# Patient Record
Sex: Female | Born: 1970 | Hispanic: Yes | Marital: Married | Smoking: Never smoker
Health system: Southern US, Community
[De-identification: ages and names within clinical notes are randomized; demographics above are authoritative.]

## PROBLEM LIST (undated history)

## (undated) DIAGNOSIS — F329 Major depressive disorder, single episode, unspecified: Secondary | ICD-10-CM

## (undated) DIAGNOSIS — F32A Depression, unspecified: Secondary | ICD-10-CM

## (undated) DIAGNOSIS — M797 Fibromyalgia: Secondary | ICD-10-CM

## (undated) DIAGNOSIS — F419 Anxiety disorder, unspecified: Secondary | ICD-10-CM

## (undated) DIAGNOSIS — K589 Irritable bowel syndrome without diarrhea: Secondary | ICD-10-CM

## (undated) DIAGNOSIS — H832X9 Labyrinthine dysfunction, unspecified ear: Secondary | ICD-10-CM

## (undated) DIAGNOSIS — E8940 Asymptomatic postprocedural ovarian failure: Secondary | ICD-10-CM

## (undated) DIAGNOSIS — J309 Allergic rhinitis, unspecified: Secondary | ICD-10-CM

## (undated) DIAGNOSIS — T7840XA Allergy, unspecified, initial encounter: Secondary | ICD-10-CM

## (undated) DIAGNOSIS — H8120 Vestibular neuronitis, unspecified ear: Secondary | ICD-10-CM

## (undated) DIAGNOSIS — L0591 Pilonidal cyst without abscess: Secondary | ICD-10-CM

## (undated) DIAGNOSIS — M1990 Unspecified osteoarthritis, unspecified site: Secondary | ICD-10-CM

## (undated) DIAGNOSIS — G4700 Insomnia, unspecified: Secondary | ICD-10-CM

## (undated) DIAGNOSIS — F3289 Other specified depressive episodes: Secondary | ICD-10-CM

## (undated) DIAGNOSIS — E8941 Symptomatic postprocedural ovarian failure: Secondary | ICD-10-CM

## (undated) DIAGNOSIS — G43909 Migraine, unspecified, not intractable, without status migrainosus: Secondary | ICD-10-CM

## (undated) DIAGNOSIS — H8190 Unspecified disorder of vestibular function, unspecified ear: Secondary | ICD-10-CM

## (undated) DIAGNOSIS — K219 Gastro-esophageal reflux disease without esophagitis: Secondary | ICD-10-CM

## (undated) HISTORY — DX: Vestibular neuronitis, unspecified ear: H81.20

## (undated) HISTORY — DX: Asymptomatic postprocedural ovarian failure: E89.40

## (undated) HISTORY — DX: Allergy, unspecified, initial encounter: T78.40XA

## (undated) HISTORY — DX: Insomnia, unspecified: G47.00

## (undated) HISTORY — DX: Fibromyalgia: M79.7

## (undated) HISTORY — PX: BREAST BIOPSY: SHX20

## (undated) HISTORY — PX: OTHER SURGICAL HISTORY: SHX169

## (undated) HISTORY — DX: Major depressive disorder, single episode, unspecified: F32.9

## (undated) HISTORY — DX: Allergic rhinitis, unspecified: J30.9

## (undated) HISTORY — PX: CHOLECYSTECTOMY: SHX55

## (undated) HISTORY — PX: APPENDECTOMY: SHX54

## (undated) HISTORY — DX: Unspecified disorder of vestibular function, unspecified ear: H81.90

## (undated) HISTORY — DX: Irritable bowel syndrome, unspecified: K58.9

## (undated) HISTORY — DX: Depression, unspecified: F32.A

## (undated) HISTORY — DX: Unspecified osteoarthritis, unspecified site: M19.90

## (undated) HISTORY — DX: Pilonidal cyst without abscess: L05.91

## (undated) HISTORY — DX: Anxiety disorder, unspecified: F41.9

## (undated) HISTORY — DX: Symptomatic postprocedural ovarian failure: E89.41

## (undated) HISTORY — PX: TOTAL ABDOMINAL HYSTERECTOMY: SHX209

## (undated) HISTORY — PX: KNEE SURGERY: SHX244

## (undated) HISTORY — DX: Gastro-esophageal reflux disease without esophagitis: K21.9

## (undated) HISTORY — DX: Other specified depressive episodes: F32.89

## (undated) HISTORY — PX: TEMPOROMANDIBULAR JOINT SURGERY: SHX35

## (undated) HISTORY — PX: BILATERAL SALPINGOOPHORECTOMY: SHX1223

## (undated) HISTORY — DX: Labyrinthine dysfunction, unspecified ear: H83.2X9

## (undated) HISTORY — DX: Migraine, unspecified, not intractable, without status migrainosus: G43.909

## (undated) HISTORY — PX: UPPER GASTROINTESTINAL ENDOSCOPY: SHX188

---

## 1998-05-23 ENCOUNTER — Ambulatory Visit (HOSPITAL_COMMUNITY): Admission: RE | Admit: 1998-05-23 | Discharge: 1998-05-23 | Payer: Self-pay | Admitting: Obstetrics and Gynecology

## 1999-03-16 ENCOUNTER — Other Ambulatory Visit: Admission: RE | Admit: 1999-03-16 | Discharge: 1999-03-16 | Payer: Self-pay | Admitting: Obstetrics and Gynecology

## 1999-10-27 ENCOUNTER — Other Ambulatory Visit: Admission: RE | Admit: 1999-10-27 | Discharge: 1999-10-27 | Payer: Self-pay | Admitting: Orthopedic Surgery

## 2000-12-16 ENCOUNTER — Ambulatory Visit (HOSPITAL_COMMUNITY): Admission: RE | Admit: 2000-12-16 | Discharge: 2000-12-16 | Payer: Self-pay | Admitting: Obstetrics and Gynecology

## 2006-04-04 ENCOUNTER — Ambulatory Visit: Payer: Self-pay | Admitting: Gynecology

## 2006-09-26 ENCOUNTER — Ambulatory Visit: Payer: Self-pay | Admitting: Gastroenterology

## 2006-10-14 ENCOUNTER — Ambulatory Visit: Payer: Self-pay | Admitting: Gastroenterology

## 2006-12-15 ENCOUNTER — Ambulatory Visit: Payer: Self-pay | Admitting: Gastroenterology

## 2007-06-30 ENCOUNTER — Encounter (INDEPENDENT_AMBULATORY_CARE_PROVIDER_SITE_OTHER): Payer: Self-pay | Admitting: *Deleted

## 2007-11-01 ENCOUNTER — Ambulatory Visit: Payer: Self-pay | Admitting: Gastroenterology

## 2007-11-18 ENCOUNTER — Emergency Department: Payer: Self-pay | Admitting: Emergency Medicine

## 2007-11-19 ENCOUNTER — Inpatient Hospital Stay: Payer: Self-pay | Admitting: Internal Medicine

## 2008-02-13 ENCOUNTER — Ambulatory Visit: Payer: Self-pay | Admitting: Family Medicine

## 2009-08-05 ENCOUNTER — Ambulatory Visit: Payer: Self-pay | Admitting: Family Medicine

## 2009-09-01 ENCOUNTER — Encounter: Payer: Self-pay | Admitting: Family Medicine

## 2009-09-05 ENCOUNTER — Encounter: Payer: Self-pay | Admitting: Family Medicine

## 2009-10-06 ENCOUNTER — Encounter: Payer: Self-pay | Admitting: Family Medicine

## 2009-10-07 ENCOUNTER — Ambulatory Visit: Payer: Self-pay | Admitting: Unknown Physician Specialty

## 2009-10-21 ENCOUNTER — Ambulatory Visit: Payer: Self-pay | Admitting: Family Medicine

## 2009-10-28 ENCOUNTER — Encounter: Payer: Self-pay | Admitting: Orthopedic Surgery

## 2009-11-05 ENCOUNTER — Encounter: Payer: Self-pay | Admitting: Family Medicine

## 2009-11-05 ENCOUNTER — Encounter: Payer: Self-pay | Admitting: Orthopedic Surgery

## 2010-09-24 ENCOUNTER — Encounter: Payer: Self-pay | Admitting: Unknown Physician Specialty

## 2010-10-06 ENCOUNTER — Encounter: Payer: Self-pay | Admitting: Unknown Physician Specialty

## 2010-11-05 ENCOUNTER — Encounter: Payer: Self-pay | Admitting: Unknown Physician Specialty

## 2010-12-06 ENCOUNTER — Encounter: Payer: Self-pay | Admitting: Unknown Physician Specialty

## 2010-12-27 ENCOUNTER — Encounter: Payer: Self-pay | Admitting: Gynecology

## 2011-04-23 NOTE — Op Note (Signed)
[facility]  Patient:    Angela Morgan, Angela Morgan                    MRN: [id number] Proc. Date: 12/16/00 Adm. Date:  [id number] Attending:  Frederich Balding                           Operative Report  PREOPERATIVE DIAGNOSIS:       Pelvic endometriosis.  POSTOPERATIVE DIAGNOSIS:      Pelvic endometriosis.  OPERATIVE PROCEDURE:          1. Diagnostic laparoscopy.                               2. Laser ablation of endometriotic implants.  SURGEON:                      Juluis Mire, M.D.  ANESTHESIA:                   General endotracheal.  ESTIMATED BLOOD LOSS:         Minimal.  PACKS/DRAINS:                 None.  INTRAOPERATIVE BLOOD PLACEMENT:  None.  COMPLICATIONS:                None.  INDICATIONS:                  As dictated in the history and physical.  DESCRIPTION OF PROCEDURE:     The patient was taken to the operating room and placed in the supine position.  After a satisfactory level of general endotracheal anesthesia was obtained, the patient was placed in the dorsal lithotomy position using the Allen stirrups.  The abdomen, perineum, and vagina were prepped out with Betadine, and draped as a sterile field.  The bladder was emptied with catheterization.  A Hulka tenaculum was then put in place.  A subumbilical incision was made with the knife.  The Veress needle was then introduced into the abdominal cavity.  The abdomen was insufflated with CO2 until the pressure reading read 20 mmHg.  The trocar was then introduced along with the laparoscope.  There was no evidence of injury to adjacent organs.  The pressure maximum was then decreased to 15 mmHg.  A 5 mm trocar was placed into the suprapubic area under direct visualization.  The appendix was unremarkable.  Both lateral gutters were cleared.  The upper abdomen, including the liver and the tip of the gallbladder were unremarkable. The uterus was upper limits of normal size.  She  had some whitish discolored adhesive lesions on the posterior aspect of the uterus.  She had two peritoneal defects in the cul-de-sac with associated superficial endometriosis.  She had an implant of whitish endometriosis along the left pelvic side wall and right pelvic side wall.  The tubes and ovaries were unremarkable and uninvolved.  There were no adhesions in the cul-de-sac. Using the Yag laser with the small rounded sapphire tip, the areas of endometriosis in the posterior aspect of the uterus, cul-de-sac, and both lateral walls were adequately ablated.  We thoroughly irrigated the area. There was no evidence of injury to adjacent organs, and no active bleeding. We did put in a third 5 mm trocar in the left lower quadrant, after visualizing the epigastric vessels, for  elevation of the sigmoid colon during the cul-de-sac ablation.  At the end of the procedure we had good hemostasis. The abdomen was deflated with carbon dioxide.  All trocars were removed.  The subumbilical incision was closed with interrupted subcuticulars of #4-0 Vicryl.  The suprapubic incision was closed with Steri-Strips.  The Hulka tenaculum was then removed.  The patient was taken out of the dorsal lithotomy position.  Once extubated and alert, she was transferred to the recovery room in good condition.  The sponge, instrument, and needle counts were reported as correct by the circulating nurse x 2. DD:  12/16/00 TD:  12/17/00 Job: [id number]

## 2011-04-23 NOTE — H&P (Signed)
[facility]  Patient:    Angela Morgan, Angela Morgan                    MRN: [id number] Adm. Date:  [id number] Attending:  Frederich Balding                         History and Physical  HISTORY:                      The patient is a 40 year old gravida 4, para 2-0-2-2, married white female who presents for a diagnostic laparoscopy with laser standby, for evaluation of pelvic pain.  The patient has a long history of endometriosis.  She had two prior laparoscopies, one in 1993, one in 1999, with finding of minimal pelvic endometriosis.  She presents is complaining of increasing pelvic pain and discomfort.  She has been on Lupron; however, despite the Lupron therapy she has continued to have significant pain and discomfort.  Due to this, she has undergone an ultrasound as well as antibiotic treatment without improvement. She also has been tried on birth control pills, without help.  She presents now at the present time to undergo a repeat laparoscopy with laser standby.  ALLERGIES:                    PREDNISONE, BIAXIN.  CURRENT MEDICATIONS:          Lupron.  PAST MEDICAL HISTORY:         Usual childhood diseases, without any significant sequela.  OBSTETRIC HISTORY:            She has had two prior cesarean sections, one spontaneous abortion in 1999.  A pregnancy termination in 1997, due to spina bifida.  PAST SURGICAL HISTORY:        Two previous diagnostic laparoscopies and a previous D&C.  She has also had previous knee surgery twice, one in 1989, and one in 1990.  FAMILY HISTORY:               Noncontributory.  SOCIAL HISTORY:               No tobacco or alcohol use.  REVIEW OF SYSTEMS:            Noncontributory.  PHYSICAL EXAMINATION:  VITAL SIGNS:                  The patient is afebrile with stable vital signs.  HEENT:                        Normocephalic.  Pupils equal, round, reactive to light and accommodation.  Extraocular movements  intact.  Sclerae and conjunctivae clear.  Oropharynx clear.  NECK:                         Without thyromegaly.  BREASTS:                      No discrete masses.  LUNGS:                        Clear.  CARDIOVASCULAR:               A regular rhythm and rate, without murmurs or gallops.  ABDOMEN:  Benign.  PELVIC:                       Normal external genitalia.  Vaginal mucosa clear.  Cervix is unremarkable.  Uterus midposition.  Marked cul-de-sac tenderness.  Adnexa unremarkable.  EXTREMITIES:                  Trace of edema.  NEUROLOGIC:                   Grossly normal.  IMPRESSION:                   Recurrent pelvic endometriosis and/or uterine                               adenomyosis.  PLAN:                         The patient will undergo a repeat diagnostic laparoscopy with laser standby.  The risks of surgery have been discussed, including the risks of anesthesia, the risks of infection, risks of hemorrhage that can necessitate a transfusion, with the risks of AIDS or hepatitis.  The risks of injury to adjacent organs, including the bladder and bowel that could require further exploratory surgery, the risks of deep vein thrombosis and pulmonary embolus.  The patient expressed an understanding of the indications and risks. DD:  12/16/00 TD:  12/16/00 Job: [id number]

## 2012-05-03 ENCOUNTER — Ambulatory Visit: Payer: Self-pay | Admitting: Family Medicine

## 2012-06-29 ENCOUNTER — Encounter: Payer: Self-pay | Admitting: Unknown Physician Specialty

## 2012-07-06 ENCOUNTER — Encounter: Payer: Self-pay | Admitting: Unknown Physician Specialty

## 2012-07-31 ENCOUNTER — Encounter: Payer: Self-pay | Admitting: Family Medicine

## 2012-07-31 ENCOUNTER — Ambulatory Visit (INDEPENDENT_AMBULATORY_CARE_PROVIDER_SITE_OTHER): Payer: BC Managed Care – PPO | Admitting: Family Medicine

## 2012-07-31 VITALS — BP 109/72 | HR 61 | Temp 98.2°F | Resp 16 | Ht 64.5 in | Wt 169.0 lb

## 2012-07-31 DIAGNOSIS — F3289 Other specified depressive episodes: Secondary | ICD-10-CM

## 2012-07-31 DIAGNOSIS — F329 Major depressive disorder, single episode, unspecified: Secondary | ICD-10-CM

## 2012-07-31 DIAGNOSIS — G4700 Insomnia, unspecified: Secondary | ICD-10-CM

## 2012-07-31 DIAGNOSIS — H8120 Vestibular neuronitis, unspecified ear: Secondary | ICD-10-CM

## 2012-07-31 DIAGNOSIS — H832X9 Labyrinthine dysfunction, unspecified ear: Secondary | ICD-10-CM

## 2012-07-31 DIAGNOSIS — F418 Other specified anxiety disorders: Secondary | ICD-10-CM

## 2012-07-31 MED ORDER — SERTRALINE HCL 100 MG PO TABS: ORAL_TABLET | ORAL | Status: DC

## 2012-07-31 NOTE — Patient Instructions (Addendum)
1. Depression  sertraline (ZOLOFT) 100 MG tablet, Ambulatory referral to Psychiatry  2. Vestibular neuritis    3. Vestibular dysfunction    4. Insomnia

## 2012-07-31 NOTE — Progress Notes (Signed)
Subjective:    Patient ID: Angela Morgan, female    DOB: December 27, 1970, 41 y.o.   MRN: [id number]  HPIThis 41 y.o. female presents for evaluation of   1.  Depression:  Loves seeing [name]; seeing her once weekly.  "I cannot do life" having good and bad days.  Baby steps at this time.  Taking day by day.  Last visit with her was a really bad day; good timing.  Staying really busy which has been good.  [place] recommended working again and cannot do it; financially need the money but can't do it.  +SI two weeks ago; felt overwhelmed.  Planned birthday party for son before school started.  Ambien for sleep; taking once every 2 weeks.  Clonazepam 1/2 during the day and 1/2 night around 7:30-8:00.  Sometimes anxiety med not enough; worse at night.  Zoloft 100mg  daily for one month.  Sleeping 7 hours of sleep on most night; usually gets 4-6 hours per night.   2.  Vestibular neuritis: follow up with [facility]; vestibular rehab not working.  Concerned that PTSD contributing.  Went to [employer] and became anxious and dizzy; [place]en recommended psychiatry consultation.  Tried to schedule appointment with Maryruth Bun but appointment 12/2012.   Review of Systems  Constitutional: Negative for fever, diaphoresis, fatigue and unexpected weight change.  Respiratory: Negative for shortness of breath.   Cardiovascular: Negative for chest pain.  Neurological: Positive for dizziness. Negative for tremors, seizures, syncope, facial asymmetry, speech difficulty, weakness, light-headedness, numbness and headaches.  Psychiatric/Behavioral: Positive for disturbed wake/sleep cycle, dysphoric mood and decreased concentration. Negative for suicidal ideas, hallucinations, behavioral problems, confusion, self-injury and agitation. The patient is nervous/anxious. The patient is not hyperactive.     No past medical history on file.  Past Surgical History  Procedure Date  . Total abdominal hysterectomy   . Knee surgery     Prior to  Admission medications   Medication Sig Start Date End Date Taking? Authorizing Provider  clonazePAM (KLONOPIN) 1 MG tablet Take 1 mg by mouth 2 (two) times daily as needed.   Yes Historical Provider, MD  dexlansoprazole (DEXILANT) 60 MG capsule Take 60 mg by mouth daily.   Yes Historical Provider, MD  estradiol (ESTRACE) 1 MG tablet Take 1 mg by mouth daily.   Yes Historical Provider, MD  sertraline (ZOLOFT) 100 MG tablet 1.5 tablets daily for depression 07/31/12  Yes Ethelda Chick, MD  zolpidem (AMBIEN) 10 MG tablet Take 10 mg by mouth at bedtime as needed.   Yes Historical Provider, MD    Allergies  Allergen Reactions  . Biaxin (Clarithromycin)     History   Social History  . Marital Status: Married    Spouse Name: N/A    Number of Children: N/A  . Years of Education: N/A   Occupational History  . Not on file.   Social History Main Topics  . Smoking status: Never Smoker   . Smokeless tobacco: Not on file  . Alcohol Use: Not on file  . Drug Use: Not on file  . Sexually Active: Not on file   Other Topics Concern  . Not on file   Social History Narrative  . No narrative on file    Family History  Problem Relation Age of Onset  . Diabetes Maternal Grandfather   . Hypertension Mother   . Hypertension Father   . Stroke Mother   . COPD Mother         Objective:   Physical Exam  Nursing note and vitals reviewed. Constitutional: She is oriented to person, place, and time. She appears well-developed and well-nourished. No distress.  HENT:  Head: Normocephalic and atraumatic.  Eyes: Conjunctivae and EOM are normal. Pupils are equal, round, and reactive to light.  Neck: Normal range of motion. Neck supple. No thyromegaly present.  Cardiovascular: Normal rate, regular rhythm, normal heart sounds and intact distal pulses.   Pulmonary/Chest: Breath sounds normal.  Lymphadenopathy:    She has no cervical adenopathy.  Neurological: She is alert and oriented to person,  place, and time. No cranial nerve deficit. She exhibits normal muscle tone. Coordination normal.  Skin: Skin is warm and dry. She is not diaphoretic.  Psychiatric: She has a normal mood and affect. Her behavior is normal. Judgment and thought content normal.       Assessment & Plan:   1. Depression  sertraline (ZOLOFT) 100 MG tablet, Ambulatory referral to Psychiatry  2. Vestibular neuritis    3. Vestibular dysfunction    4. Insomnia       1.  Depression: persistent; increase Zoloft to 1.5 tablets daily; continue weekly psychotherapy; refer to psychiatry to evaluate for PTSD.  Increase Clonazepam to 1/2 tablet every morning and 1 whole tablet every evening.   2.  Vestibular Neuritis/Dysfunction: Persistent despite vestibular rehab.  S/p recent ENT consultation who feels large psychiatric component to current vestibular dysfunction; recommends more aggressive treatment of anxiety/depression. 3.  Insomnia: Persistent; secondary to anxiety/depression; continue Ambien PRN; increase evening Clonazepam to one tablet.

## 2012-08-04 ENCOUNTER — Encounter: Payer: Self-pay | Admitting: Family Medicine

## 2012-08-04 DIAGNOSIS — F418 Other specified anxiety disorders: Secondary | ICD-10-CM | POA: Insufficient documentation

## 2012-08-04 DIAGNOSIS — G4700 Insomnia, unspecified: Secondary | ICD-10-CM | POA: Insufficient documentation

## 2012-08-04 DIAGNOSIS — H8120 Vestibular neuronitis, unspecified ear: Secondary | ICD-10-CM | POA: Insufficient documentation

## 2012-08-04 NOTE — Progress Notes (Signed)
Reviewed and agree.

## 2012-08-06 ENCOUNTER — Encounter: Payer: Self-pay | Admitting: Unknown Physician Specialty

## 2012-08-28 ENCOUNTER — Telehealth: Payer: Self-pay

## 2012-08-28 NOTE — Telephone Encounter (Signed)
PATIENT CALLED TO GIVE NOTICE TO DR. [name] THAT SHE IS FILING FOR DISABILITY WITH [employer] AND THAT SHE WILL SEE HER Monday FOR HER APPOINTMENT :) AND WILL BRING HER SON Angela Morgan IN LATER THIS WEEK FOR SPLINT INJURY. THANK YOU!

## 2012-08-28 NOTE — Telephone Encounter (Signed)
Message noted; does patient need anything from me at this time?  KMS

## 2012-08-29 NOTE — Telephone Encounter (Signed)
Called pt and she stated that she does not need anything from [place] at this time. Thanked [place] for calling

## 2012-08-30 ENCOUNTER — Encounter: Payer: Self-pay | Admitting: Family Medicine

## 2012-09-01 ENCOUNTER — Encounter: Payer: Self-pay | Admitting: *Deleted

## 2012-09-04 ENCOUNTER — Ambulatory Visit (INDEPENDENT_AMBULATORY_CARE_PROVIDER_SITE_OTHER): Payer: BC Managed Care – PPO | Admitting: Family Medicine

## 2012-09-04 ENCOUNTER — Encounter: Payer: Self-pay | Admitting: Family Medicine

## 2012-09-04 VITALS — BP 118/78 | HR 60 | Temp 97.9°F | Resp 16 | Ht 64.0 in | Wt 171.0 lb

## 2012-09-04 DIAGNOSIS — Z23 Encounter for immunization: Secondary | ICD-10-CM

## 2012-09-04 DIAGNOSIS — N959 Unspecified menopausal and perimenopausal disorder: Secondary | ICD-10-CM

## 2012-09-04 DIAGNOSIS — F341 Dysthymic disorder: Secondary | ICD-10-CM

## 2012-09-04 DIAGNOSIS — F418 Other specified anxiety disorders: Secondary | ICD-10-CM

## 2012-09-04 DIAGNOSIS — H8120 Vestibular neuronitis, unspecified ear: Secondary | ICD-10-CM

## 2012-09-04 MED ORDER — ESTRADIOL 2 MG PO TABS: 2.0000 mg | ORAL_TABLET | Freq: Every day | ORAL | Status: DC

## 2012-09-04 NOTE — Progress Notes (Signed)
[address]   [place], [place]  [address]   [phone]  Subjective:    Patient ID: Angela Morgan, female    DOB: [birth year], 41 y.o.   MRN: [id number]  HPIThis 41 y.o. female presents for evaluation of the following:  1.  Depression/Anxiety:  One month follow-up; management changes at last visit include increasing Zoloft to 100mg  1.5 tablets daily and increasing Klonopin to one whole tablet qhs.  Also referred to psychiatry to evaluate for PTSD.  S/p evaluation by Dr. Janeece Morgan; Angela Morgan recommended several psychiatrists and included Dr. Janeece Morgan; evaluated by Dr. Janeece Morgan 08/19/12.  Stopped Zoloft and switched to Lexapro 10mg  daily.  Also changed Clonazepam 0.5mg  one every morning and two in evening.  Sleeping 7-8 hours per night; still has bad days.  Tomorrow will be six month anniversary of mother's death.  Already feels much better on Lexapro; feels more level on Lexapro.   Discussed PTSD with Su; recommended addressing grief first; then can better assess PTSD better.  Wanted to get grief better treated.  Follow-up in 6 weeks.  Seeing Angela Morgan once weekly.  Still feeling very overwhelmed.  Struggling with son who is doing poorly in school; recently caught in big lie; has an indifferent attitude.  Son has seen [place] one time.  40% improved; much improved from onset six months ago.    2.  Vestibular Neuritis:  Has filed for disability; continues to attend vestibular rehab.  Wants to go shopping today but pt knows she cannot go alone.  Must have family member present.  Follow-up with [facility].  Once weekly rehabilitation at this time.  When dizziness worsens, anxiety and depression worsen.  Physical therapist was out for a few weeks and had a replacement who was too aggressive with PT; suffered with dizziness for three days after rehab where usually suffers with dizziness for 24 hours after PT.  Duration of rehab/PT unknown.  Taking baby steps.  Unable to work due to persistent dizziness.    3.  Menopausal  symptoms: requesting increase in Estradiol.  Suffering with sweats.   Review of Systems  Constitutional: Negative for fever, chills, diaphoresis and fatigue.  Eyes: Negative for photophobia and visual disturbance.  Neurological: Positive for dizziness. Negative for tremors, syncope, facial asymmetry, weakness, light-headedness, numbness and headaches.  Psychiatric/Behavioral: Positive for dysphoric mood. Negative for suicidal ideas, behavioral problems, confusion, disturbed wake/sleep cycle, self-injury and agitation. The patient is nervous/anxious.     Past Medical History  Diagnosis Date  . Anxiety   . Depression   . GERD (gastroesophageal reflux disease)   . Migraine, unspecified, without mention of intractable migraine without mention of status migrainosus   . Arthropathy, unspecified, site unspecified   . Allergic rhinitis, cause unspecified   . Situational, disturbance, acute   . Vestibular dysfunction   . Vestibular neuritis   . Insomnia, unspecified   . Symptomatic states associated with artificial menopause   . Urticaria, unspecified   . Depressive disorder, not elsewhere classified   . Adjustment disorder with depressed mood   . Fibromyalgia   . Postablative ovarian failure   . Irritable bowel syndrome   . Cellulitis 11/2007    Orbital Shingles   Hospitalized    Past Surgical History  Procedure Date  . Total abdominal hysterectomy     ovaries removed  . Knee surgery   . Temporomandibular joint surgery   . Appendectomy   . Gallbladder surgery   . Cesarean section     x  2    Prior to Admission medications   Medication Sig Start Date End Date Taking? Authorizing Provider  clonazePAM (KLONOPIN) 1 MG tablet Take 1 mg by mouth 2 (two) times daily as needed.   Yes Historical Provider, MD  dexlansoprazole (DEXILANT) 60 MG capsule Take 60 mg by mouth daily.   Yes Historical Provider, MD  escitalopram (LEXAPRO) 10 MG tablet Take 10 mg by mouth daily.   Yes Historical  Provider, MD  estradiol (ESTRACE) 2 MG tablet Take 1 tablet (2 mg total) by mouth daily. 09/04/12  Yes Ethelda Chick, MD  meclizine (ANTIVERT) 25 MG tablet Take 25 mg by mouth daily.   Yes Historical Provider, MD  zolpidem (AMBIEN) 10 MG tablet Take 10 mg by mouth at bedtime as needed.   Yes Historical Provider, MD  sertraline (ZOLOFT) 100 MG tablet 1.5 tablets daily for depression 07/31/12   Ethelda Chick, MD    Allergies  Allergen Reactions  . Biaxin (Clarithromycin)     History   Social History  . Marital Status: Married    Spouse Name: N/A    Number of Children: 2  . Years of Education: college   Occupational History  . [employer]     part time  . student     Set designer   Social History Main Topics  . Smoking status: Never Smoker   . Smokeless tobacco: Not on file  . Alcohol Use: 2.4 oz/week    4 Glasses of wine per week  . Drug Use: No  . Sexually Active: Yes   Other Topics Concern  . Not on file   Social History Narrative   Marital Status:married x  15 years. Happily, no abuse.Person living in home: husband, 2 children.Exercise: sporadic walking.Always uses seat belts / helmet. Smoke alarm in the home. No guns in the home.Caffeine use: moderate    Family History  Problem Relation Age of Onset  . Diabetes Maternal Grandfather   . Hypertension Mother   . Stroke Mother   . COPD Mother   . Aneurysm Mother     brain  . Transient ischemic attack Mother     multiple  . Hypertension Father   . Heart disease Father   . Mental illness Father   . Depression Father   . Hyperlipidemia Brother   . Hypertension Brother        Objective:   Physical Exam  Nursing note and vitals reviewed. Constitutional: She is oriented to person, place, and time. She appears well-developed and well-nourished. No distress.  HENT:  Head: Normocephalic and atraumatic.  Eyes: Conjunctivae normal are normal. Pupils are equal, round, and reactive to light.  Neck: Normal range of  motion. Neck supple. No thyromegaly present.  Cardiovascular: Normal rate, regular rhythm, normal heart sounds and intact distal pulses.  Exam reveals no gallop and no friction rub.   No murmur heard. Pulmonary/Chest: Effort normal and breath sounds normal.  Lymphadenopathy:    She has no cervical adenopathy.  Neurological: She is alert and oriented to person, place, and time. She has normal reflexes. No cranial nerve deficit. She exhibits normal muscle tone.  Skin: She is not diaphoretic.  Psychiatric: She has a normal mood and affect. Her behavior is normal. Judgment and thought content normal.   INFLUENZA VACCINE ADMINISTERED.    Assessment & Plan:   1. Menopausal disorder  estradiol (ESTRACE) 2 MG tablet  2. Depression with anxiety    3. Vestibular neuritis    4.  Need for prophylactic vaccination and inoculation against influenza  Flu vaccine greater than or equal to 3yo preservative free IM    1.  Anxiety and depression: improving with switch from Zoloft to Lexapro per psychiatry.  Dr. Janeece Morgan to overtake management. 2.  Vestibular Neuritis:  Persistent.  Continues to undergo weekly physical therapy; has applied for disability.  Dr. Beckey Rutter feels large psychiatric component to persistent vestibular dysfunction and concern for underlying PTSD; psychiatry to treat and address major depressive episode first and then reevaluate for PTSD later.   3.  Menopausal Disorder: Worsening/uncontrolled; agree with increase of Estrace to 2mg  daily.  Rx provided. 4.  S/p flu vaccine.

## 2012-09-04 NOTE — Patient Instructions (Addendum)
1. Menopausal disorder  estradiol (ESTRACE) 2 MG tablet  2. Depression with anxiety    3. Vestibular neuritis    4. Need for prophylactic vaccination and inoculation against influenza  Flu vaccine greater than or equal to 41yo preservative free IM

## 2012-09-05 ENCOUNTER — Encounter: Payer: Self-pay | Admitting: Unknown Physician Specialty

## 2012-09-05 ENCOUNTER — Telehealth: Payer: Self-pay

## 2012-09-05 NOTE — Telephone Encounter (Signed)
PATIENT CALLED REQUESTING THAT DR.SMITH CALL HER AT HER CONVENIENCE IN REGARDS TO HER SON Geroge Baseman YOU!

## 2012-09-05 NOTE — Telephone Encounter (Signed)
I called patient and she wants to let you know about what Dr Renae Fickle has said about Angela Morgan, she would not give me any further information. I advised if may be a few days before call back, she indicated this is fine, there is no rush

## 2012-09-08 NOTE — Progress Notes (Signed)
Reviewed and agree.

## 2012-09-11 NOTE — Telephone Encounter (Signed)
Left message on voice mail at 18:10. KMS

## 2012-09-13 NOTE — Telephone Encounter (Signed)
Mother spoke with staff on 09/12/12; s/p ortho consult; treating as if has stress fracture; wearing boots, no activity for one month.  KMS

## 2012-09-19 ENCOUNTER — Telehealth: Payer: Self-pay

## 2012-09-19 NOTE — Telephone Encounter (Signed)
Im guessing this is for Dr [name]?
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
## 2012-10-06 ENCOUNTER — Encounter: Payer: Self-pay | Admitting: Unknown Physician Specialty

## 2012-10-09 DIAGNOSIS — Z0271 Encounter for disability determination: Secondary | ICD-10-CM

## 2012-11-05 ENCOUNTER — Encounter: Payer: Self-pay | Admitting: Unknown Physician Specialty

## 2012-11-22 ENCOUNTER — Ambulatory Visit: Payer: BC Managed Care – PPO | Admitting: Family Medicine

## 2012-12-06 DIAGNOSIS — Z0271 Encounter for disability determination: Secondary | ICD-10-CM

## 2013-01-09 ENCOUNTER — Ambulatory Visit (INDEPENDENT_AMBULATORY_CARE_PROVIDER_SITE_OTHER): Payer: BC Managed Care – PPO | Admitting: Family Medicine

## 2013-01-09 ENCOUNTER — Encounter: Payer: Self-pay | Admitting: Family Medicine

## 2013-01-09 VITALS — BP 124/90 | HR 62 | Temp 97.9°F | Resp 16 | Ht 64.0 in | Wt 163.4 lb

## 2013-01-09 DIAGNOSIS — G4700 Insomnia, unspecified: Secondary | ICD-10-CM

## 2013-01-09 DIAGNOSIS — F418 Other specified anxiety disorders: Secondary | ICD-10-CM

## 2013-01-09 DIAGNOSIS — F341 Dysthymic disorder: Secondary | ICD-10-CM

## 2013-01-09 DIAGNOSIS — E28319 Asymptomatic premature menopause: Secondary | ICD-10-CM

## 2013-01-09 DIAGNOSIS — H8120 Vestibular neuronitis, unspecified ear: Secondary | ICD-10-CM

## 2013-01-09 NOTE — Patient Instructions (Addendum)
1. Vestibular neuritis   2. Depression with anxiety   3. Insomnia

## 2013-01-09 NOTE — Assessment & Plan Note (Signed)
Desires trial of bio-identical hormones; s/p consultation with Jackie/pharmacist at [facility].  Recommend gynecology consultation as I do not prescribe bio-identical hormone therapy.  Pt expressed understanding.
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
## 2013-01-09 NOTE — Assessment & Plan Note (Signed)
Persistent; refer to vestibular specialist at [facility] for second opinion and other treatment options.  Pt recently denied disability and discouraged.  Currently feels unable to work.

## 2013-01-09 NOTE — Assessment & Plan Note (Signed)
Persistent; followed by psychiatry and continues to receive weekly psychotherapy.  Now struggling with marital strain.  Counseling provided face to face for 30 minutes with 50% of visit dedicated to counseling, coordination of care.

## 2013-02-04 ENCOUNTER — Emergency Department: Payer: Self-pay | Admitting: Emergency Medicine

## 2013-02-07 ENCOUNTER — Ambulatory Visit: Payer: BC Managed Care – PPO

## 2013-02-07 ENCOUNTER — Ambulatory Visit (INDEPENDENT_AMBULATORY_CARE_PROVIDER_SITE_OTHER): Payer: BC Managed Care – PPO | Admitting: Family Medicine

## 2013-02-07 VITALS — BP 122/78 | HR 64 | Temp 98.5°F | Resp 18 | Ht 64.0 in | Wt 163.4 lb

## 2013-02-07 DIAGNOSIS — M546 Pain in thoracic spine: Secondary | ICD-10-CM

## 2013-02-07 DIAGNOSIS — M542 Cervicalgia: Secondary | ICD-10-CM

## 2013-02-07 DIAGNOSIS — R0602 Shortness of breath: Secondary | ICD-10-CM

## 2013-02-07 DIAGNOSIS — R51 Headache: Secondary | ICD-10-CM

## 2013-02-07 LAB — POCT CBC
Granulocyte percent: 54.5 %G (ref 37–80)
HCT, POC: 45 % (ref 37.7–47.9)
Hemoglobin: 14.6 g/dL (ref 12.2–16.2)
MPV: 9.1 fL (ref 0–99.8)
POC Granulocyte: 3.5 (ref 2–6.9)
RBC: 4.87 M/uL (ref 4.04–5.48)

## 2013-02-07 MED ORDER — HYDROCODONE-ACETAMINOPHEN 5-325 MG PO TABS: 1.0000 | ORAL_TABLET | Freq: Four times a day (QID) | ORAL | Status: DC | PRN
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
## 2013-02-27 ENCOUNTER — Encounter: Payer: Self-pay | Admitting: Family Medicine

## 2013-03-06 ENCOUNTER — Encounter: Payer: Self-pay | Admitting: Family Medicine

## 2013-03-06 ENCOUNTER — Ambulatory Visit (INDEPENDENT_AMBULATORY_CARE_PROVIDER_SITE_OTHER): Payer: BC Managed Care – PPO | Admitting: Family Medicine

## 2013-03-06 VITALS — BP 110/66 | HR 50 | Temp 98.0°F | Resp 16 | Ht 65.0 in | Wt 164.4 lb

## 2013-03-06 DIAGNOSIS — G43909 Migraine, unspecified, not intractable, without status migrainosus: Secondary | ICD-10-CM

## 2013-03-06 DIAGNOSIS — S161XXD Strain of muscle, fascia and tendon at neck level, subsequent encounter: Secondary | ICD-10-CM

## 2013-03-06 DIAGNOSIS — R42 Dizziness and giddiness: Secondary | ICD-10-CM

## 2013-03-06 DIAGNOSIS — Z5189 Encounter for other specified aftercare: Secondary | ICD-10-CM

## 2013-03-06 DIAGNOSIS — F418 Other specified anxiety disorders: Secondary | ICD-10-CM

## 2013-03-06 DIAGNOSIS — F489 Nonpsychotic mental disorder, unspecified: Secondary | ICD-10-CM

## 2013-03-06 DIAGNOSIS — L259 Unspecified contact dermatitis, unspecified cause: Secondary | ICD-10-CM

## 2013-03-06 DIAGNOSIS — H8123 Vestibular neuronitis, bilateral: Secondary | ICD-10-CM

## 2013-03-06 LAB — CBC WITH DIFFERENTIAL/PLATELET
Eosinophils Relative: 4 % (ref 0–5)
HCT: 36.9 % (ref 36.0–46.0)
Lymphocytes Relative: 32 % (ref 12–46)
Lymphs Abs: 1.4 10*3/uL (ref 0.7–4.0)
MCV: 83.9 fL (ref 78.0–100.0)
Monocytes Absolute: 0.4 10*3/uL (ref 0.1–1.0)
Neutro Abs: 2.5 10*3/uL (ref 1.7–7.7)
Platelets: 246 10*3/uL (ref 150–400)
RBC: 4.4 MIL/uL (ref 3.87–5.11)
WBC: 4.5 10*3/uL (ref 4.0–10.5)

## 2013-03-06 LAB — COMPREHENSIVE METABOLIC PANEL
ALT: 12 U/L (ref 0–35)
AST: 15 U/L (ref 0–37)
Albumin: 3.9 g/dL (ref 3.5–5.2)
CO2: 29 mEq/L (ref 19–32)
Calcium: 9.1 mg/dL (ref 8.4–10.5)
Chloride: 104 mEq/L (ref 96–112)
Creat: 0.72 mg/dL (ref 0.50–1.10)
Potassium: 4.3 mEq/L (ref 3.5–5.3)
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
## 2013-03-08 ENCOUNTER — Encounter: Payer: Self-pay | Admitting: Family Medicine

## 2013-03-14 ENCOUNTER — Telehealth: Payer: Self-pay | Admitting: Radiology

## 2013-03-14 NOTE — Telephone Encounter (Signed)
Headache wellness clinic called about her, they need labs faxed, this is done, [phone]

## 2013-03-15 DIAGNOSIS — G43909 Migraine, unspecified, not intractable, without status migrainosus: Secondary | ICD-10-CM | POA: Insufficient documentation

## 2013-03-15 DIAGNOSIS — R42 Dizziness and giddiness: Secondary | ICD-10-CM | POA: Insufficient documentation

## 2013-03-15 DIAGNOSIS — S161XXA Strain of muscle, fascia and tendon at neck level, initial encounter: Secondary | ICD-10-CM | POA: Insufficient documentation

## 2013-03-15 DIAGNOSIS — L259 Unspecified contact dermatitis, unspecified cause: Secondary | ICD-10-CM | POA: Insufficient documentation

## 2013-03-15 MED ORDER — TRIAMCINOLONE ACETONIDE 0.1 % EX CREA: TOPICAL_CREAM | Freq: Two times a day (BID) | CUTANEOUS | Status: DC

## 2013-03-15 NOTE — Assessment & Plan Note (Signed)
Persistent; refer to HA specialist to determine if secondary to migraine.

## 2013-03-15 NOTE — Assessment & Plan Note (Signed)
Stable.  S/p consultation at [facility] vestibular clinic; no vestibular pathology identified; no follow-up needed; recommended evaluation by HA specialist to determine if dizziness secondary to migraines.

## 2013-03-15 NOTE — Assessment & Plan Note (Signed)
Worsening headaches and associated with dizziness; concern that dizziness secondary to migraine process; refer to HA Specialist for further evaluation.

## 2013-03-15 NOTE — Assessment & Plan Note (Signed)
Persistent; now with marital issues.  Continue with intensive psychotherapy and psychiatric management.

## 2013-03-15 NOTE — Assessment & Plan Note (Signed)
New.  Etiology unclear; secondary to new puppy versus new necklace.  Rx for Triamcinolone provided.

## 2013-03-15 NOTE — Assessment & Plan Note (Signed)
Improving; continue with PT and massage and muscle relaxer PRN.

## 2013-03-27 ENCOUNTER — Ambulatory Visit (INDEPENDENT_AMBULATORY_CARE_PROVIDER_SITE_OTHER): Payer: BC Managed Care – PPO | Admitting: General Surgery

## 2013-03-27 ENCOUNTER — Encounter: Payer: Self-pay | Admitting: General Surgery

## 2013-03-27 VITALS — BP 116/80 | HR 76 | Resp 12 | Ht 64.0 in | Wt 162.0 lb

## 2013-03-27 DIAGNOSIS — L0591 Pilonidal cyst without abscess: Secondary | ICD-10-CM

## 2013-03-27 MED ORDER — DOXYCYCLINE HYCLATE 100 MG PO CAPS: 100.0000 mg | ORAL_CAPSULE | Freq: Two times a day (BID) | ORAL | Status: DC

## 2013-03-27 NOTE — Patient Instructions (Addendum)
Return in 2 weeks. E prescribed Doxycycline.

## 2013-03-27 NOTE — Progress Notes (Signed)
Patient ID: Angela Morgan, female   DOB: 04-24-1971, 42 y.o.   MRN: [id number]  Chief Complaint  Patient presents with  . Other    pilonidal cyst    HPI Angela Morgan is a 42 y.o. female here today for her pilonidal cyst that is staring to hurt last week.Had a abscess in December 2013-was drained and resolved.  HPI  Past Medical History  Diagnosis Date  . Anxiety   . Depression   . GERD (gastroesophageal reflux disease)   . Migraine, unspecified, without mention of intractable migraine without mention of status migrainosus   . Arthropathy, unspecified, site unspecified   . Allergic rhinitis, cause unspecified   . Situational, disturbance, acute   . Vestibular dysfunction   . Vestibular neuritis   . Insomnia, unspecified   . Symptomatic states associated with artificial menopause   . Urticaria, unspecified   . Depressive disorder, not elsewhere classified   . Adjustment disorder with depressed mood   . Fibromyalgia   . Postablative ovarian failure   . Irritable bowel syndrome   . Cellulitis 11/2007    Orbital Shingles   Hospitalized    Past Surgical History  Procedure Laterality Date  . Total abdominal hysterectomy      ovaries removed  . Knee surgery    . Temporomandibular joint surgery    . Appendectomy    . Gallbladder surgery    . Cesarean section      x 2    Family History  Problem Relation Age of Onset  . Diabetes Maternal Grandfather   . Hypertension Mother   . Stroke Mother   . COPD Mother   . Aneurysm Mother     brain  . Transient ischemic attack Mother     multiple  . Hypertension Father   . Heart disease Father   . Mental illness Father   . Depression Father   . Hyperlipidemia Brother   . Hypertension Brother     Social History History  Substance Use Topics  . Smoking status: Never Smoker   . Smokeless tobacco: Never Used  . Alcohol Use: 2.4 oz/week    4 Glasses of wine per week    Allergies  Allergen Reactions  . Biaxin  (Clarithromycin)     Current Outpatient Prescriptions  Medication Sig Dispense Refill  . clonazePAM (KLONOPIN) 1 MG tablet Take 1 mg by mouth 2 (two) times daily as needed.      Marland Kitchen dexlansoprazole (DEXILANT) 60 MG capsule Take 60 mg by mouth daily.      Marland Kitchen escitalopram (LEXAPRO) 10 MG tablet Take 10 mg by mouth daily.      Marland Kitchen estradiol (ESTRACE) 2 MG tablet Take 1 tablet (2 mg total) by mouth daily.  30 tablet  11  . gabapentin (NEURONTIN) 300 MG capsule Take 300 mg by mouth daily.      Marland Kitchen HYDROcodone-acetaminophen (NORCO/VICODIN) 5-325 MG per tablet Take 1 tablet by mouth every 6 (six) hours as needed for pain.  30 tablet  0  . ibuprofen (ADVIL,MOTRIN) 800 MG tablet Take 800 mg by mouth every 8 (eight) hours as needed for pain.      . methocarbamol (ROBAXIN) 500 MG tablet Take 500 mg by mouth 4 (four) times daily.      . NON FORMULARY 1 suppository by Other route every other day. Estrogen/testosterone suppository/ pill every other day.      . triamcinolone cream (KENALOG) 0.1 % Apply topically 2 (two) times daily.  30  g  0  . zolpidem (AMBIEN) 10 MG tablet Take 10 mg by mouth at bedtime as needed.      . doxycycline (VIBRAMYCIN) 100 MG capsule Take 1 capsule (100 mg total) by mouth 2 (two) times daily.  20 capsule  0  . DULoxetine (CYMBALTA) 30 MG capsule Take 30 mg by mouth daily.       No current facility-administered medications for this visit.    Review of Systems Review of Systems  Constitutional: Negative.   Respiratory: Negative.   Cardiovascular: Negative.     Blood pressure 116/80, pulse 76, resp. rate 12, height 5\' 4"  (1.626 m), weight 162 lb (73.483 kg).  Physical Exam Physical Exam  Constitutional: She is oriented to person, place, and time. She appears well-developed and well-nourished.    Neurological: She is alert and oriented to person, place, and time.  Skin: Skin is warm and dry.    Data Reviewed nil  Assessment    Pilonidal cyst, possibly getting infected  again.     Plan    Course of Doxycycline for 10 days. Recheck in 2 weeks        Kyomi Hector G 03/28/2013, 2:36 PM

## 2013-03-28 ENCOUNTER — Encounter: Payer: Self-pay | Admitting: General Surgery

## 2013-03-28 DIAGNOSIS — L0591 Pilonidal cyst without abscess: Secondary | ICD-10-CM

## 2013-03-28 HISTORY — DX: Pilonidal cyst without abscess: L05.91

## 2013-04-05 ENCOUNTER — Encounter: Payer: Self-pay | Admitting: Family Medicine

## 2013-04-12 ENCOUNTER — Ambulatory Visit (INDEPENDENT_AMBULATORY_CARE_PROVIDER_SITE_OTHER): Payer: BC Managed Care – PPO | Admitting: General Surgery

## 2013-04-12 ENCOUNTER — Encounter: Payer: Self-pay | Admitting: General Surgery

## 2013-04-12 VITALS — BP 124/68 | HR 74 | Resp 12 | Ht 63.0 in | Wt 160.0 lb

## 2013-04-12 DIAGNOSIS — L0591 Pilonidal cyst without abscess: Secondary | ICD-10-CM

## 2013-04-12 NOTE — Patient Instructions (Addendum)
The patient is aware to call back for any questions or concerns. Schedule outpatient surgery Limit activity until area is healed during postoperative phase  Pilonidal Cyst Care After A pilonidal cyst occurs when hairs get trapped (ingrown) beneath the skin in the crease between the buttocks over your sacrum (the bone under that crease). Pilonidal cysts are most common in young men with a lot of body hair. When the cyst breaks(ruptured) or leaks, fluid from the cyst may cause burning and itching. If the cyst becomes infected, it causes a painful swelling filled with pus (abscess). The pus and trapped hairs need to be removed (often by lancing) so that the infection can heal. The word pilonidal means hair nest. HOME CARE INSTRUCTIONS If the pilonidal sinus was NOT DRAINING OR LANCED:  Keep the area clean and dry. Bathe or shower daily. Wash the area well with a germ-killing soap. Hot tub baths may help prevent infection. Dry the area well with a towel.  Avoid tight clothing in order to keep area as moisture-free as possible.  Keep area between buttocks as free from hair as possible. A depilatory may be used.  Take antibiotics as directed.  Only take over-the-counter or prescription medicines for pain, discomfort, or fever as directed by your caregiver. If the cyst WAS INFECTED AND NEEDED TO BE DRAINED:  Your caregiver may have packed the wound with gauze to keep the wound open. This allows the wound to heal from the inside outward and continue to drain.  Return as directed for a wound check.  If you take tub baths or showers, repack the wound with gauze as directed following. Sponge baths are a good alternative. Sitz baths may be used three to four times a day or as directed.  If an antibiotic was ordered to fight the infection, take as directed.  Only take over-the-counter or prescription medicines for pain, discomfort, or fever as directed by your caregiver.  If a drain was in place  and removed, use sitz baths for 20 minutes 4 times per day. Clean the wound gently with mild unscented soap, pat dry, and then apply a dry dressing as directed. If you had surgery and IT WAS MARSUPIALIZED (LEFT OPEN):  Your wound was packed with gauze to keep the wound open. This allows the wound to heal from the inside outwards and continue draining. The changing of the dressing regularly also helps keep the wound clean.  Return as directed for a wound check.  If you take tub baths or showers, repack the wound with gauze as directed following. Sponge baths are a good alternative. Sitz baths can also be used. This may be done three to four times a day or as directed.  If an antibiotic was ordered to fight the infection, take as directed.  Only take over-the-counter or prescription medicines for pain, discomfort, or fever as directed by your caregiver.  If you had surgery and the wound was closed you may care for it as directed. This generally includes keeping it dry and clean and dressing it as directed. SEEK MEDICAL CARE IF:   You have increased pain, swelling, redness, drainage, or bleeding from the area.  You have a fever.  You have muscles aches, dizziness, or a general ill feeling. Document Released: 12/23/2006 Document Revised: 02/14/2012 Document Reviewed: 03/09/2007 [facility] Patient Information 2013 [place], [place].  Patient's surgery has been scheduled for 04-19-13 at [facility].

## 2013-04-12 NOTE — Progress Notes (Signed)
Patient ID: Angela Morgan, female   DOB: July 13, 1971, 42 y.o.   MRN: [id number]  Chief Complaint  Patient presents with  . Other    pilonidal cyst    HPI Angela Morgan is a 42 y.o. female here today following up with a  pilonidal cyst.  Denies any drainage but states it is still tender. HPI  Past Medical History  Diagnosis Date  . Anxiety   . Depression   . GERD (gastroesophageal reflux disease)   . Migraine, unspecified, without mention of intractable migraine without mention of status migrainosus   . Arthropathy, unspecified, site unspecified   . Allergic rhinitis, cause unspecified   . Situational, disturbance, acute   . Vestibular dysfunction   . Vestibular neuritis   . Insomnia, unspecified   . Symptomatic states associated with artificial menopause   . Urticaria, unspecified   . Depressive disorder, not elsewhere classified   . Adjustment disorder with depressed mood   . Fibromyalgia   . Postablative ovarian failure   . Irritable bowel syndrome   . Cellulitis 11/2007    Orbital Shingles   Hospitalized    Past Surgical History  Procedure Laterality Date  . Total abdominal hysterectomy      ovaries removed  . Knee surgery    . Temporomandibular joint surgery    . Appendectomy    . Gallbladder surgery    . Cesarean section      x 2    Family History  Problem Relation Age of Onset  . Diabetes Maternal Grandfather   . Hypertension Mother   . Stroke Mother   . COPD Mother   . Aneurysm Mother     brain  . Transient ischemic attack Mother     multiple  . Hypertension Father   . Heart disease Father   . Mental illness Father   . Depression Father   . Hyperlipidemia Brother   . Hypertension Brother     Social History History  Substance Use Topics  . Smoking status: Never Smoker   . Smokeless tobacco: Never Used  . Alcohol Use: 2.4 oz/week    4 Glasses of wine per week    Allergies  Allergen Reactions  . Biaxin (Clarithromycin)     Current  Outpatient Prescriptions  Medication Sig Dispense Refill  . clonazePAM (KLONOPIN) 1 MG tablet Take 1 mg by mouth 2 (two) times daily as needed.      Marland Kitchen dexlansoprazole (DEXILANT) 60 MG capsule Take 60 mg by mouth daily.      Marland Kitchen doxycycline (VIBRAMYCIN) 100 MG capsule Take 1 capsule (100 mg total) by mouth 2 (two) times daily.  20 capsule  0  . DULoxetine (CYMBALTA) 30 MG capsule Take 30 mg by mouth daily.      Marland Kitchen escitalopram (LEXAPRO) 10 MG tablet Take 10 mg by mouth daily.      Marland Kitchen estradiol (ESTRACE) 2 MG tablet Take 1 tablet (2 mg total) by mouth daily.  30 tablet  11  . gabapentin (NEURONTIN) 300 MG capsule Take 300 mg by mouth daily.      Marland Kitchen HYDROcodone-acetaminophen (NORCO/VICODIN) 5-325 MG per tablet Take 1 tablet by mouth every 6 (six) hours as needed for pain.  30 tablet  0  . ibuprofen (ADVIL,MOTRIN) 800 MG tablet Take 800 mg by mouth every 8 (eight) hours as needed for pain.      . methocarbamol (ROBAXIN) 500 MG tablet Take 500 mg by mouth 4 (four) times daily.      Marland Kitchen  NON FORMULARY 1 suppository by Other route every other day. Estrogen/testosterone suppository/ pill every other day.      . triamcinolone cream (KENALOG) 0.1 % Apply topically 2 (two) times daily.  30 g  0  . zolpidem (AMBIEN) 10 MG tablet Take 10 mg by mouth at bedtime as needed.       No current facility-administered medications for this visit.    Review of Systems Review of Systems  Constitutional: Negative.   Respiratory: Negative.   Cardiovascular: Negative.     Blood pressure 124/68, pulse 74, resp. rate 12, height 5\' 3"  (1.6 m), weight 160 lb (72.576 kg).  Physical Exam Physical Exam  Constitutional: She is oriented to person, place, and time. She appears well-developed and well-nourished.  Eyes: Conjunctivae are normal.  Neck: Normal range of motion.  Cardiovascular: Regular rhythm.   Pulmonary/Chest: Effort normal and breath sounds normal.  Lymphadenopathy:    She has no cervical adenopathy.   Neurological: She is alert and oriented to person, place, and time.  Skin: Skin is warm and dry.  Pilonidal area looks healed but is still tender to palpation.  Data Reviewed none  Assessment    Area healed, no redness or apparent swelling, skin opening in midline is unchanged.  Area of recent infection to the left appears also healed but is still mildly tender.    Plan     Excision recommended given her local pain and tenderness.      Patient's surgery has been scheduled for 04-19-13 at [facility].  Nayel Purdy G 04/12/2013, 1:49 PM

## 2013-04-17 ENCOUNTER — Telehealth: Payer: Self-pay | Admitting: *Deleted

## 2013-04-17 NOTE — Telephone Encounter (Signed)
Patient called wanting to reschedule surgery from 04-19-13 to 04-27-13. Leah in the O.R. notified of date change.

## 2013-04-26 ENCOUNTER — Telehealth: Payer: Self-pay

## 2013-04-26 NOTE — Telephone Encounter (Signed)
HEATHER FROM [facility] STATES PT IS HAVING SURGERY TOMORROW AND THEY ARE IN NEED OF HER RECORDS PLEASE FAX TO [phone] AND THE PHONE NUMBER IS [phone]

## 2013-04-26 NOTE — Telephone Encounter (Signed)
Left message for Heather to call back.

## 2013-04-26 NOTE — Telephone Encounter (Signed)
Spoke with Herbert Seta at [facility]. States they won't be on Epic until 2015 so they needs records from last office visit/labs faxed at [phone]. Sent thru [employer].

## 2013-04-27 ENCOUNTER — Ambulatory Visit: Payer: Self-pay | Admitting: General Surgery

## 2013-04-27 DIAGNOSIS — L0591 Pilonidal cyst without abscess: Secondary | ICD-10-CM

## 2013-05-01 ENCOUNTER — Ambulatory Visit (INDEPENDENT_AMBULATORY_CARE_PROVIDER_SITE_OTHER): Payer: BC Managed Care – PPO | Admitting: *Deleted

## 2013-05-01 ENCOUNTER — Encounter: Payer: Self-pay | Admitting: General Surgery

## 2013-05-01 ENCOUNTER — Encounter: Payer: Self-pay | Admitting: *Deleted

## 2013-05-01 DIAGNOSIS — L0591 Pilonidal cyst without abscess: Secondary | ICD-10-CM

## 2013-05-01 LAB — PATHOLOGY REPORT

## 2013-05-01 NOTE — Progress Notes (Signed)
Patient here today for postop check.  Area with derma bond a dried blood present.  Dr Evette Cristal aware and no intervention needed.  May shower and avoid strenuous activity that will pull area.

## 2013-05-01 NOTE — Patient Instructions (Addendum)
May shower and avoid strenuous activity that will pull the area.

## 2013-05-06 ENCOUNTER — Encounter: Payer: Self-pay | Admitting: Family Medicine

## 2013-05-08 ENCOUNTER — Ambulatory Visit (INDEPENDENT_AMBULATORY_CARE_PROVIDER_SITE_OTHER): Payer: BC Managed Care – PPO | Admitting: General Surgery

## 2013-05-08 VITALS — BP 124/76 | HR 80 | Resp 12 | Ht 63.0 in | Wt 161.0 lb

## 2013-05-08 DIAGNOSIS — L0591 Pilonidal cyst without abscess: Secondary | ICD-10-CM

## 2013-05-08 NOTE — Progress Notes (Signed)
Patient ID: Angela Morgan, female   DOB: 1971/02/08, 42 y.o.   MRN: [id number]  Chief Complaint  Patient presents with  . Other    pilindal cyst    HPI Angela Morgan is a 42 y.o. female. here today for her post op pilnidal cyst.HPI She reports the incision opened wide.  Past Medical History  Diagnosis Date  . Anxiety   . Depression   . GERD (gastroesophageal reflux disease)   . Migraine, unspecified, without mention of intractable migraine without mention of status migrainosus   . Arthropathy, unspecified, site unspecified   . Allergic rhinitis, cause unspecified   . Situational, disturbance, acute   . Vestibular dysfunction   . Vestibular neuritis   . Insomnia, unspecified   . Symptomatic states associated with artificial menopause   . Urticaria, unspecified   . Depressive disorder, not elsewhere classified   . Adjustment disorder with depressed mood   . Fibromyalgia   . Postablative ovarian failure   . Irritable bowel syndrome   . Cellulitis 11/2007    Orbital Shingles   Hospitalized    Past Surgical History  Procedure Laterality Date  . Total abdominal hysterectomy      ovaries removed  . Knee surgery    . Temporomandibular joint surgery    . Appendectomy    . Gallbladder surgery    . Cesarean section      x 2    Family History  Problem Relation Age of Onset  . Diabetes Maternal Grandfather   . Hypertension Mother   . Stroke Mother   . COPD Mother   . Aneurysm Mother     brain  . Transient ischemic attack Mother     multiple  . Hypertension Father   . Heart disease Father   . Mental illness Father   . Depression Father   . Hyperlipidemia Brother   . Hypertension Brother     Social History History  Substance Use Topics  . Smoking status: Never Smoker   . Smokeless tobacco: Never Used  . Alcohol Use: 2.4 oz/week    4 Glasses of wine per week    Allergies  Allergen Reactions  . Biaxin (Clarithromycin)     Current Outpatient  Prescriptions  Medication Sig Dispense Refill  . ciprofloxacin (CIPRO) 500 MG tablet Take 1 tablet (500 mg total) by mouth 2 (two) times daily.  14 tablet  0  . clonazePAM (KLONOPIN) 1 MG tablet Take 1 mg by mouth 2 (two) times daily as needed.      Marland Kitchen dexlansoprazole (DEXILANT) 60 MG capsule Take 60 mg by mouth daily.      Marland Kitchen doxycycline (VIBRAMYCIN) 100 MG capsule Take 1 capsule (100 mg total) by mouth 2 (two) times daily.  20 capsule  0  . DULoxetine (CYMBALTA) 30 MG capsule Take 30 mg by mouth daily.      Marland Kitchen escitalopram (LEXAPRO) 10 MG tablet Take 10 mg by mouth daily.      Marland Kitchen estradiol (ESTRACE) 2 MG tablet Take 1 tablet (2 mg total) by mouth daily.  30 tablet  11  . gabapentin (NEURONTIN) 300 MG capsule Take 300 mg by mouth daily.      Marland Kitchen HYDROcodone-acetaminophen (NORCO/VICODIN) 5-325 MG per tablet Take 1 tablet by mouth every 6 (six) hours as needed for pain.  30 tablet  0  . ibuprofen (ADVIL,MOTRIN) 800 MG tablet Take 800 mg by mouth every 8 (eight) hours as needed for pain.      Marland Kitchen  methocarbamol (ROBAXIN) 500 MG tablet Take 500 mg by mouth 4 (four) times daily.      . NON FORMULARY 1 suppository by Other route every other day. Estrogen/testosterone suppository/ pill every other day.      . triamcinolone cream (KENALOG) 0.1 % Apply topically 2 (two) times daily.  30 g  0  . zolpidem (AMBIEN) 10 MG tablet Take 10 mg by mouth at bedtime as needed.       No current facility-administered medications for this visit.    Review of Systems Review of Systems  Constitutional: Negative.   Respiratory: Negative.   Cardiovascular: Negative.     Blood pressure 124/76, pulse 80, resp. rate 12, height 5\' 3"  (1.6 m), weight 161 lb (73.029 kg).  Physical Exam Physical Exam the piloniodal incision is wide open, looks clea. No surrounding redness or induration.  Data Reviewed none  Assessment    Pilonidal cyst was open .    Plan    Plan for secondary closure next week in office. Simple  dressings for now, no need to pack the wound.        Bethanie Bloxom G 05/10/2013, 8:20 AM

## 2013-05-08 NOTE — Patient Instructions (Addendum)
Patient to keep area clean, simple padded dressing and return in one week.

## 2013-05-09 ENCOUNTER — Telehealth: Payer: Self-pay | Admitting: *Deleted

## 2013-05-09 DIAGNOSIS — L0591 Pilonidal cyst without abscess: Secondary | ICD-10-CM

## 2013-05-09 MED ORDER — CIPROFLOXACIN HCL 500 MG PO TABS: 500.0000 mg | ORAL_TABLET | Freq: Two times a day (BID) | ORAL | Status: AC

## 2013-05-09 NOTE — Telephone Encounter (Signed)
Please send in Rx for Cipro 500mg  bid, #14, no refills.

## 2013-05-09 NOTE — Telephone Encounter (Signed)
Phone call from patient states the area has opened up this morning and is draining greenish thick fluid that has a bad odor.  States "I just don't feel good", "run down". The area is still tender.  She would like an antibiotic called in for her?

## 2013-05-10 ENCOUNTER — Encounter: Payer: Self-pay | Admitting: General Surgery

## 2013-05-14 ENCOUNTER — Ambulatory Visit (INDEPENDENT_AMBULATORY_CARE_PROVIDER_SITE_OTHER): Payer: BC Managed Care – PPO | Admitting: General Surgery

## 2013-05-14 ENCOUNTER — Encounter: Payer: Self-pay | Admitting: General Surgery

## 2013-05-14 VITALS — BP 130/78 | HR 76 | Resp 13 | Ht 63.0 in | Wt 163.0 lb

## 2013-05-14 DIAGNOSIS — L0591 Pilonidal cyst without abscess: Secondary | ICD-10-CM

## 2013-05-14 NOTE — Patient Instructions (Addendum)
Secondary closure at the end of the week.

## 2013-05-14 NOTE — Progress Notes (Signed)
Patient ID: Angela Morgan, female   DOB: 1971-04-05, 42 y.o.   MRN: [id number]  No chief complaint on file.   HPI Angela Morgan is a 42 y.o. female states cyst still the same drainage has decreased but has an odor. States bright bleeding with certain activity.    HPI  Past Medical History  Diagnosis Date  . Anxiety   . Depression   . GERD (gastroesophageal reflux disease)   . Migraine, unspecified, without mention of intractable migraine without mention of status migrainosus   . Arthropathy, unspecified, site unspecified   . Allergic rhinitis, cause unspecified   . Situational, disturbance, acute   . Vestibular dysfunction   . Vestibular neuritis   . Insomnia, unspecified   . Symptomatic states associated with artificial menopause   . Urticaria, unspecified   . Depressive disorder, not elsewhere classified   . Adjustment disorder with depressed mood   . Fibromyalgia   . Postablative ovarian failure   . Irritable bowel syndrome   . Cellulitis 11/2007    Orbital Shingles   Hospitalized    Past Surgical History  Procedure Laterality Date  . Total abdominal hysterectomy      ovaries removed  . Knee surgery    . Temporomandibular joint surgery    . Appendectomy    . Gallbladder surgery    . Cesarean section      x 2    Family History  Problem Relation Age of Onset  . Diabetes Maternal Grandfather   . Hypertension Mother   . Stroke Mother   . COPD Mother   . Aneurysm Mother     brain  . Transient ischemic attack Mother     multiple  . Hypertension Father   . Heart disease Father   . Mental illness Father   . Depression Father   . Hyperlipidemia Brother   . Hypertension Brother     Social History History  Substance Use Topics  . Smoking status: Never Smoker   . Smokeless tobacco: Never Used  . Alcohol Use: 2.4 oz/week    4 Glasses of wine per week    Allergies  Allergen Reactions  . Biaxin (Clarithromycin)     Current Outpatient Prescriptions   Medication Sig Dispense Refill  . clonazePAM (KLONOPIN) 1 MG tablet Take 1 mg by mouth 2 (two) times daily as needed.      Marland Kitchen dexlansoprazole (DEXILANT) 60 MG capsule Take 60 mg by mouth daily.      Marland Kitchen doxycycline (VIBRAMYCIN) 100 MG capsule Take 1 capsule (100 mg total) by mouth 2 (two) times daily.  20 capsule  0  . DULoxetine (CYMBALTA) 30 MG capsule Take 30 mg by mouth daily.      Marland Kitchen escitalopram (LEXAPRO) 10 MG tablet Take 10 mg by mouth daily.      Marland Kitchen estradiol (ESTRACE) 2 MG tablet Take 1 tablet (2 mg total) by mouth daily.  30 tablet  11  . gabapentin (NEURONTIN) 300 MG capsule Take 300 mg by mouth daily.      Marland Kitchen HYDROcodone-acetaminophen (NORCO/VICODIN) 5-325 MG per tablet Take 1 tablet by mouth every 6 (six) hours as needed for pain.  30 tablet  0  . ibuprofen (ADVIL,MOTRIN) 800 MG tablet Take 800 mg by mouth every 8 (eight) hours as needed for pain.      . methocarbamol (ROBAXIN) 500 MG tablet Take 500 mg by mouth 4 (four) times daily.      . NON FORMULARY 1 suppository by  Other route every other day. Estrogen/testosterone suppository/ pill every other day.      . triamcinolone cream (KENALOG) 0.1 % Apply topically 2 (two) times daily.  30 g  0  . zolpidem (AMBIEN) 10 MG tablet Take 10 mg by mouth at bedtime as needed.       No current facility-administered medications for this visit.    Review of Systems Review of Systems  Constitutional: Negative.   Respiratory: Negative.   Cardiovascular: Negative.     Blood pressure 130/78, pulse 76, resp. rate 13, height 5\' 3"  (1.6 m), weight 163 lb (73.936 kg).  Physical Exam Physical Exam Pilonidal wound looks clean- still has some fibrinous exudate. No active drainage. No surrounding erythema or induration Data Reviewed none       open pilonidal wound. Would lie to see more even granulation tissue before closing the wound Plan    Secondary closure at the end of the week.       SANKAR,SEEPLAPUTHUR G 05/15/2013, 1:39 PM

## 2013-05-15 ENCOUNTER — Encounter: Payer: Self-pay | Admitting: General Surgery

## 2013-05-18 ENCOUNTER — Encounter: Payer: Self-pay | Admitting: General Surgery

## 2013-05-18 ENCOUNTER — Ambulatory Visit (INDEPENDENT_AMBULATORY_CARE_PROVIDER_SITE_OTHER): Payer: BC Managed Care – PPO | Admitting: General Surgery

## 2013-05-18 VITALS — BP 100/70 | HR 76 | Resp 16 | Ht 63.0 in | Wt 159.0 lb

## 2013-05-18 DIAGNOSIS — L0591 Pilonidal cyst without abscess: Secondary | ICD-10-CM

## 2013-05-18 NOTE — Patient Instructions (Addendum)
Patient to return in 10-14 days.

## 2013-05-18 NOTE — Progress Notes (Signed)
Patient ID: Angela Morgan, female   DOB: 1971-04-17, 42 y.o.   MRN: [id number]  Chief Complaint  Patient presents with  . Other    closure of pilonidal wound    HPI Angela Morgan is a 42 y.o. female who presents for a pilonidal wound closure. HPI  Past Medical History  Diagnosis Date  . Anxiety   . Depression   . GERD (gastroesophageal reflux disease)   . Migraine, unspecified, without mention of intractable migraine without mention of status migrainosus   . Arthropathy, unspecified, site unspecified   . Allergic rhinitis, cause unspecified   . Situational, disturbance, acute   . Vestibular dysfunction   . Vestibular neuritis   . Insomnia, unspecified   . Symptomatic states associated with artificial menopause   . Urticaria, unspecified   . Depressive disorder, not elsewhere classified   . Adjustment disorder with depressed mood   . Fibromyalgia   . Postablative ovarian failure   . Irritable bowel syndrome   . Cellulitis 11/2007    Orbital Shingles   Hospitalized    Past Surgical History  Procedure Laterality Date  . Total abdominal hysterectomy      ovaries removed  . Knee surgery    . Temporomandibular joint surgery    . Appendectomy    . Gallbladder surgery    . Cesarean section      x 2    Family History  Problem Relation Age of Onset  . Diabetes Maternal Grandfather   . Hypertension Mother   . Stroke Mother   . COPD Mother   . Aneurysm Mother     brain  . Transient ischemic attack Mother     multiple  . Hypertension Father   . Heart disease Father   . Mental illness Father   . Depression Father   . Hyperlipidemia Brother   . Hypertension Brother     Social History History  Substance Use Topics  . Smoking status: Never Smoker   . Smokeless tobacco: Never Used  . Alcohol Use: 2.4 oz/week    4 Glasses of wine per week    Allergies  Allergen Reactions  . Biaxin (Clarithromycin)     Current Outpatient Prescriptions  Medication Sig  Dispense Refill  . clonazePAM (KLONOPIN) 1 MG tablet Take 1 mg by mouth 2 (two) times daily as needed.      Marland Kitchen dexlansoprazole (DEXILANT) 60 MG capsule Take 60 mg by mouth daily.      Marland Kitchen doxycycline (VIBRAMYCIN) 100 MG capsule Take 1 capsule (100 mg total) by mouth 2 (two) times daily.  20 capsule  0  . DULoxetine (CYMBALTA) 30 MG capsule Take 30 mg by mouth daily.      Marland Kitchen escitalopram (LEXAPRO) 10 MG tablet Take 10 mg by mouth daily.      Marland Kitchen estradiol (ESTRACE) 2 MG tablet Take 1 tablet (2 mg total) by mouth daily.  30 tablet  11  . gabapentin (NEURONTIN) 300 MG capsule Take 300 mg by mouth daily.      Marland Kitchen HYDROcodone-acetaminophen (NORCO/VICODIN) 5-325 MG per tablet Take 1 tablet by mouth every 6 (six) hours as needed for pain.  30 tablet  0  . ibuprofen (ADVIL,MOTRIN) 800 MG tablet Take 800 mg by mouth every 8 (eight) hours as needed for pain.      . methocarbamol (ROBAXIN) 500 MG tablet Take 500 mg by mouth 4 (four) times daily.      . NON FORMULARY 1 suppository by Other route  every other day. Estrogen/testosterone suppository/ pill every other day.      . triamcinolone cream (KENALOG) 0.1 % Apply topically 2 (two) times daily.  30 g  0  . zolpidem (AMBIEN) 10 MG tablet Take 10 mg by mouth at bedtime as needed.       No current facility-administered medications for this visit.    Review of Systems Review of Systems  Constitutional: Negative.   Respiratory: Negative.   Cardiovascular: Negative.     Blood pressure 100/70, pulse 76, resp. rate 16, height 5\' 3"  (1.6 m), weight 159 lb (72.122 kg).  Physical Exam Physical ExamThe open pilonidal wound looks clean. No signs of infection  Data Reviewed    Assessment    Open pilonidal wound.     Plan    Secondary closure completed today.      After betadine prep, 10ml of 15 xylocaine mixed with 0.5% marcaine was instilled around the wound.  Interrupted mattress sutures of 3-0 nylon were place to close the wound.. Neosporin ointment  applied and dresses with 4/4s.  Procedure well tolerated.  Pt advised on wound care-neosporin oint daily and gauze sponges dressing. F/U in 10-14 days.  SANKAR,SEEPLAPUTHUR G 05/18/2013, 8:21 PM

## 2013-05-30 ENCOUNTER — Encounter: Payer: Self-pay | Admitting: General Surgery

## 2013-05-30 ENCOUNTER — Ambulatory Visit (INDEPENDENT_AMBULATORY_CARE_PROVIDER_SITE_OTHER): Payer: BC Managed Care – PPO | Admitting: General Surgery

## 2013-05-30 VITALS — BP 120/70 | HR 70 | Resp 12 | Ht 63.0 in | Wt 158.0 lb

## 2013-05-30 DIAGNOSIS — L0591 Pilonidal cyst without abscess: Secondary | ICD-10-CM

## 2013-05-30 NOTE — Progress Notes (Signed)
Patient ID: Angela Morgan, female   DOB: Mar 26, 1971, 42 y.o.   MRN: [id number]  Chief Complaint  Patient presents with  . Follow-up    pilonidal cyst    HPI Angela Morgan is a 42 y.o. female here today folloeing up from an pilonidal wound closure done on 05/18/13.Patient states she still is draining. HPI  Past Medical History  Diagnosis Date  . Anxiety   . Depression   . GERD (gastroesophageal reflux disease)   . Migraine, unspecified, without mention of intractable migraine without mention of status migrainosus   . Arthropathy, unspecified, site unspecified   . Allergic rhinitis, cause unspecified   . Situational, disturbance, acute   . Vestibular dysfunction   . Vestibular neuritis   . Insomnia, unspecified   . Symptomatic states associated with artificial menopause   . Urticaria, unspecified   . Depressive disorder, not elsewhere classified   . Adjustment disorder with depressed mood   . Fibromyalgia   . Postablative ovarian failure   . Irritable bowel syndrome   . Cellulitis 11/2007    Orbital Shingles   Hospitalized    Past Surgical History  Procedure Laterality Date  . Total abdominal hysterectomy      ovaries removed  . Knee surgery    . Temporomandibular joint surgery    . Appendectomy    . Gallbladder surgery    . Cesarean section      x 2    Family History  Problem Relation Age of Onset  . Diabetes Maternal Grandfather   . Hypertension Mother   . Stroke Mother   . COPD Mother   . Aneurysm Mother     brain  . Transient ischemic attack Mother     multiple  . Hypertension Father   . Heart disease Father   . Mental illness Father   . Depression Father   . Hyperlipidemia Brother   . Hypertension Brother     Social History History  Substance Use Topics  . Smoking status: Never Smoker   . Smokeless tobacco: Never Used  . Alcohol Use: 2.4 oz/week    4 Glasses of wine per week    Allergies  Allergen Reactions  . Biaxin (Clarithromycin)      Current Outpatient Prescriptions  Medication Sig Dispense Refill  . clonazePAM (KLONOPIN) 1 MG tablet Take 1 mg by mouth 2 (two) times daily as needed.      Marland Kitchen dexlansoprazole (DEXILANT) 60 MG capsule Take 60 mg by mouth daily.      Marland Kitchen doxycycline (VIBRAMYCIN) 100 MG capsule Take 1 capsule (100 mg total) by mouth 2 (two) times daily.  20 capsule  0  . DULoxetine (CYMBALTA) 30 MG capsule Take 30 mg by mouth daily.      Marland Kitchen escitalopram (LEXAPRO) 10 MG tablet Take 10 mg by mouth daily.      Marland Kitchen estradiol (ESTRACE) 2 MG tablet Take 1 tablet (2 mg total) by mouth daily.  30 tablet  11  . gabapentin (NEURONTIN) 300 MG capsule Take 300 mg by mouth daily.      Marland Kitchen HYDROcodone-acetaminophen (NORCO/VICODIN) 5-325 MG per tablet Take 1 tablet by mouth every 6 (six) hours as needed for pain.  30 tablet  0  . ibuprofen (ADVIL,MOTRIN) 800 MG tablet Take 800 mg by mouth every 8 (eight) hours as needed for pain.      . methocarbamol (ROBAXIN) 500 MG tablet Take 500 mg by mouth 4 (four) times daily.      Marland Kitchen  NON FORMULARY 1 suppository by Other route every other day. Estrogen/testosterone suppository/ pill every other day.      . triamcinolone cream (KENALOG) 0.1 % Apply topically 2 (two) times daily.  30 g  0  . zolpidem (AMBIEN) 10 MG tablet Take 10 mg by mouth at bedtime as needed.       No current facility-administered medications for this visit.    Review of Systems Review of Systems  Constitutional: Negative.   Respiratory: Negative.   Cardiovascular: Negative.     Blood pressure 120/70, pulse 70, resp. rate 12, height 5\' 3"  (1.6 m), weight 158 lb (71.668 kg).  Physical Exam Physical Exam Pilonidal wound is much shallower and smaller. Looks very clean., Data Reviewed none  Assessment    The sutures were removed and the wound left open. It looks very claen and should heal seondarily     Plan   Patiennt to return  in 3-4  weeka     Chaya Dehaan G 06/01/2013, 4:50 PM

## 2013-05-30 NOTE — Patient Instructions (Addendum)
Patient to return in 3 weeks.  

## 2013-06-01 ENCOUNTER — Encounter: Payer: Self-pay | Admitting: General Surgery

## 2013-06-15 ENCOUNTER — Other Ambulatory Visit: Payer: Self-pay | Admitting: Family Medicine

## 2013-06-21 ENCOUNTER — Ambulatory Visit: Payer: BC Managed Care – PPO | Admitting: General Surgery

## 2013-06-25 ENCOUNTER — Encounter: Payer: Self-pay | Admitting: General Surgery

## 2013-06-25 ENCOUNTER — Ambulatory Visit (INDEPENDENT_AMBULATORY_CARE_PROVIDER_SITE_OTHER): Payer: BC Managed Care – PPO | Admitting: General Surgery

## 2013-06-25 ENCOUNTER — Telehealth: Payer: Self-pay | Admitting: *Deleted

## 2013-06-25 VITALS — BP 140/76 | HR 80 | Resp 12 | Ht 63.0 in | Wt 156.0 lb

## 2013-06-25 DIAGNOSIS — L0591 Pilonidal cyst without abscess: Secondary | ICD-10-CM

## 2013-06-25 NOTE — Progress Notes (Signed)
Patient ID: Angela Morgan, female   DOB: 10-Aug-1971, 42 y.o.   MRN: [id number]  Chief Complaint  Patient presents with  . Other    pilonidal cyst    HPI Angela Morgan is a 42 y.o. female. here today following up from her pilonidal cyst. HPI  Past Medical History  Diagnosis Date  . Anxiety   . Depression   . GERD (gastroesophageal reflux disease)   . Migraine, unspecified, without mention of intractable migraine without mention of status migrainosus   . Arthropathy, unspecified, site unspecified   . Allergic rhinitis, cause unspecified   . Situational, disturbance, acute   . Vestibular dysfunction   . Vestibular neuritis   . Insomnia, unspecified   . Symptomatic states associated with artificial menopause   . Urticaria, unspecified   . Depressive disorder, not elsewhere classified   . Adjustment disorder with depressed mood   . Fibromyalgia   . Postablative ovarian failure   . Irritable bowel syndrome   . Cellulitis 11/2007    Orbital Shingles   Hospitalized    Past Surgical History  Procedure Laterality Date  . Total abdominal hysterectomy      ovaries removed  . Knee surgery    . Temporomandibular joint surgery    . Appendectomy    . Gallbladder surgery    . Cesarean section      x 2    Family History  Problem Relation Age of Onset  . Diabetes Maternal Grandfather   . Hypertension Mother   . Stroke Mother   . COPD Mother   . Aneurysm Mother     brain  . Transient ischemic attack Mother     multiple  . Hypertension Father   . Heart disease Father   . Mental illness Father   . Depression Father   . Hyperlipidemia Brother   . Hypertension Brother     Social History History  Substance Use Topics  . Smoking status: Never Smoker   . Smokeless tobacco: Never Used  . Alcohol Use: 2.4 oz/week    4 Glasses of wine per week    Allergies  Allergen Reactions  . Biaxin (Clarithromycin)     Current Outpatient Prescriptions  Medication Sig Dispense  Refill  . clonazePAM (KLONOPIN) 1 MG tablet Take 1 mg by mouth 2 (two) times daily as needed.      Marland Kitchen dexlansoprazole (DEXILANT) 60 MG capsule Take 60 mg by mouth daily.      Marland Kitchen doxycycline (VIBRAMYCIN) 100 MG capsule Take 1 capsule (100 mg total) by mouth 2 (two) times daily.  20 capsule  0  . DULoxetine (CYMBALTA) 30 MG capsule Take 30 mg by mouth daily.      Marland Kitchen escitalopram (LEXAPRO) 10 MG tablet Take 10 mg by mouth daily.      Marland Kitchen estradiol (ESTRACE) 2 MG tablet Take 1 tablet (2 mg total) by mouth daily.  30 tablet  11  . gabapentin (NEURONTIN) 300 MG capsule Take 600 mg by mouth daily.       Marland Kitchen HYDROcodone-acetaminophen (NORCO/VICODIN) 5-325 MG per tablet Take 1 tablet by mouth every 6 (six) hours as needed for pain.  30 tablet  0  . ibuprofen (ADVIL,MOTRIN) 800 MG tablet Take 800 mg by mouth every 8 (eight) hours as needed for pain.      . methocarbamol (ROBAXIN) 500 MG tablet Take 500 mg by mouth 4 (four) times daily.      . NON FORMULARY 1 suppository by Other route  every other day. Estrogen/testosterone suppository/ pill every other day.      . triamcinolone cream (KENALOG) 0.1 % Apply topically 2 (two) times daily.  30 g  0  . zolpidem (AMBIEN) 10 MG tablet Take 10 mg by mouth at bedtime as needed.       No current facility-administered medications for this visit.    Review of Systems Review of Systems  Constitutional: Negative.   Respiratory: Negative.   Cardiovascular: Negative.     Blood pressure 140/76, pulse 80, resp. rate 12, height 5\' 3"  (1.6 m), weight 156 lb (70.761 kg).  Physical Exam Physical ExamPilonidal wound shows continued healing. It is now 1.5cm long and b]very superficial. No signs of infection  Data Reviewed none  Assessment    Pilonidal excision wound healing secondarily     Plan    Patient to return in one month.        Chadley Dziedzic G 06/27/2013, 6:44 AM

## 2013-06-25 NOTE — Telephone Encounter (Signed)
I tried to reach patient with no answer and I was not able to leave a message.   Patient needs an appointment for follow up for one month with Dr. Evette Cristal.

## 2013-06-25 NOTE — Patient Instructions (Addendum)
Patient to return in one month. 

## 2013-06-26 ENCOUNTER — Telehealth: Payer: Self-pay | Admitting: *Deleted

## 2013-06-26 NOTE — Telephone Encounter (Signed)
Patient was contacted today to arrange for follow up appointment in one month. This has been scheduled for 07-31-13. She is aware of date and time.

## 2013-06-27 ENCOUNTER — Encounter: Payer: Self-pay | Admitting: General Surgery

## 2013-07-31 ENCOUNTER — Ambulatory Visit: Payer: BC Managed Care – PPO | Admitting: General Surgery

## 2013-08-09 ENCOUNTER — Encounter: Payer: Self-pay | Admitting: *Deleted

## 2013-09-05 ENCOUNTER — Other Ambulatory Visit: Payer: Self-pay | Admitting: Family Medicine

## 2013-09-26 ENCOUNTER — Telehealth: Payer: Self-pay

## 2013-09-26 NOTE — Telephone Encounter (Signed)
Dr. [name],  Bethann Berkshire at [facility] called about the deposition invoice for this patient. You can reach her @ [phone]

## 2013-09-28 NOTE — Telephone Encounter (Signed)
Angela Morgan is requesting that invoice be faxed to office at [phone].  Angela Morgan of information and to fax invoice for two hours of work including deposition and preparation/review of medical records.

## 2013-10-11 ENCOUNTER — Other Ambulatory Visit: Payer: Self-pay

## 2013-11-06 DIAGNOSIS — Z0271 Encounter for disability determination: Secondary | ICD-10-CM

## 2013-12-04 ENCOUNTER — Ambulatory Visit (INDEPENDENT_AMBULATORY_CARE_PROVIDER_SITE_OTHER): Payer: BC Managed Care – PPO | Admitting: Physician Assistant

## 2013-12-04 VITALS — BP 116/70 | HR 66 | Temp 98.6°F | Resp 18 | Ht 64.5 in | Wt 153.0 lb

## 2013-12-04 DIAGNOSIS — R05 Cough: Secondary | ICD-10-CM

## 2013-12-04 DIAGNOSIS — J040 Acute laryngitis: Secondary | ICD-10-CM

## 2013-12-04 DIAGNOSIS — J329 Chronic sinusitis, unspecified: Secondary | ICD-10-CM

## 2013-12-04 MED ORDER — IPRATROPIUM BROMIDE 0.03 % NA SOLN: 2.0000 | Freq: Two times a day (BID) | NASAL | Status: DC

## 2013-12-04 MED ORDER — AMOXICILLIN 875 MG PO TABS: 1750.0000 mg | ORAL_TABLET | Freq: Two times a day (BID) | ORAL | Status: DC

## 2013-12-04 MED ORDER — GUAIFENESIN ER 1200 MG PO TB12: 1.0000 | ORAL_TABLET | Freq: Two times a day (BID) | ORAL | Status: DC | PRN

## 2013-12-04 MED ORDER — BENZONATATE 100 MG PO CAPS: 100.0000 mg | ORAL_CAPSULE | Freq: Three times a day (TID) | ORAL | Status: DC | PRN

## 2013-12-04 NOTE — Progress Notes (Signed)
Subjective:    Patient ID: Angela Morgan, female    DOB: [birth year], 42 y.o.   MRN: [id number]  PCP: Nilda Simmer, MD  Chief Complaint  Patient presents with  . Cough    x6 days   . chest congestion  . Laryngitis    x4 days    Medications, allergies, past medical history, surgical history, family history, social history and problem list reviewed and updated.  HPI  11/27/2013 some relatives, who smoke, visited.  She's allergic to the smoke. 12/26 awoke with scrtachy throat, cough.  Thought it was allergies and smoke exposure. Progressively worsening symptoms with head pressure, laryngitis.  Claritin, Advil cold and Sinus, which helped a little bit yesterday, but worse today.  Cough keeping her awake.  Recent exposure to strep throat, bronchitis and sinusitis.  No fever/chills. No nausea/vomiting/diarrhea. No unexplained achiness. Very fatigued.  Review of Systems As above.    Objective:   Physical Exam  Vitals reviewed. Constitutional: She is oriented to person, place, and time. Vital signs are normal. She appears well-developed and well-nourished. No distress.  HENT:  Head: Normocephalic and atraumatic.  Right Ear: Hearing, tympanic membrane, external ear and ear canal normal.  Left Ear: Hearing, tympanic membrane, external ear and ear canal normal.  Nose: Mucosal edema and rhinorrhea present.  No foreign bodies. Right sinus exhibits maxillary sinus tenderness and frontal sinus tenderness. Left sinus exhibits maxillary sinus tenderness and frontal sinus tenderness.  Mouth/Throat: Uvula is midline, oropharynx is clear and moist and mucous membranes are normal. No uvula swelling. No oropharyngeal exudate.  Eyes: Conjunctivae and EOM are normal. Pupils are equal, round, and reactive to light. Right eye exhibits no discharge. Left eye exhibits no discharge. No scleral icterus.  Neck: Trachea normal, normal range of motion and full passive range of motion without pain. Neck  supple. No mass and no thyromegaly present.  Cardiovascular: Normal rate, regular rhythm and normal heart sounds.   Pulmonary/Chest: Effort normal and breath sounds normal.  Lymphadenopathy:       Head (right side): No submandibular, no tonsillar, no preauricular, no posterior auricular and no occipital adenopathy present.       Head (left side): No submandibular, no tonsillar, no preauricular and no occipital adenopathy present.    She has no cervical adenopathy.       Right: No supraclavicular adenopathy present.       Left: No supraclavicular adenopathy present.  Neurological: She is alert and oriented to person, place, and time. She has normal strength. No cranial nerve deficit or sensory deficit.  Skin: Skin is warm, dry and intact. No rash noted.  Psychiatric: She has a normal mood and affect. Her speech is normal and behavior is normal.          Assessment & Plan:  1. Cough - benzonatate (TESSALON) 100 MG capsule; Take 1-2 capsules (100-200 mg total) by mouth 3 (three) times daily as needed for cough.  Dispense: 40 capsule; Refill: 0  2. Sinusitis Likely triggered by tobacco as irritant vs. Allergy. - ipratropium (ATROVENT) 0.03 % nasal spray; Place 2 sprays into both nostrils 2 (two) times daily.  Dispense: 30 mL; Refill: 0 - Guaifenesin (MUCINEX MAXIMUM STRENGTH) 1200 MG TB12; Take 1 tablet (1,200 mg total) by mouth every 12 (twelve) hours as needed.  Dispense: 14 tablet; Refill: 1 - amoxicillin (AMOXIL) 875 MG tablet; Take 2 tablets (1,750 mg total) by mouth 2 (two) times daily.  Dispense: 20 tablet; Refill: 0  3. Laryngitis Fluids.  Voice rest. Treatment as above.  Supportive care.  Anticipatory guidance.  RTC if symptoms worsen/persist.   Fernande Bras, PA-C Physician Assistant-Certified [facility]

## 2013-12-04 NOTE — Patient Instructions (Signed)
Get plenty of rest and drink at least 64 ounces of water daily. 

## 2014-01-07 ENCOUNTER — Ambulatory Visit (INDEPENDENT_AMBULATORY_CARE_PROVIDER_SITE_OTHER): Payer: BC Managed Care – PPO | Admitting: Family Medicine

## 2014-01-07 ENCOUNTER — Encounter: Payer: Self-pay | Admitting: Family Medicine

## 2014-01-07 ENCOUNTER — Ambulatory Visit: Payer: Self-pay | Admitting: Family Medicine

## 2014-01-07 VITALS — BP 108/70 | HR 66 | Temp 98.2°F | Resp 16 | Ht 64.0 in | Wt 148.0 lb

## 2014-01-07 DIAGNOSIS — Z0000 Encounter for general adult medical examination without abnormal findings: Secondary | ICD-10-CM

## 2014-01-07 DIAGNOSIS — H8120 Vestibular neuronitis, unspecified ear: Secondary | ICD-10-CM

## 2014-01-07 DIAGNOSIS — F418 Other specified anxiety disorders: Secondary | ICD-10-CM

## 2014-01-07 DIAGNOSIS — Z209 Contact with and (suspected) exposure to unspecified communicable disease: Secondary | ICD-10-CM

## 2014-01-07 DIAGNOSIS — Z1239 Encounter for other screening for malignant neoplasm of breast: Secondary | ICD-10-CM

## 2014-01-07 DIAGNOSIS — Z111 Encounter for screening for respiratory tuberculosis: Secondary | ICD-10-CM

## 2014-01-07 DIAGNOSIS — F341 Dysthymic disorder: Secondary | ICD-10-CM

## 2014-01-07 LAB — COMPLETE METABOLIC PANEL WITH GFR
ALT: 12 U/L (ref 0–35)
AST: 19 U/L (ref 0–37)
Albumin: 4.6 g/dL (ref 3.5–5.2)
Alkaline Phosphatase: 79 U/L (ref 39–117)
BILIRUBIN TOTAL: 0.7 mg/dL (ref 0.2–1.2)
BUN: 13 mg/dL (ref 6–23)
CO2: 27 meq/L (ref 19–32)
CREATININE: 0.7 mg/dL (ref 0.50–1.10)
Calcium: 9.9 mg/dL (ref 8.4–10.5)
Chloride: 106 mEq/L (ref 96–112)
GFR, Est African American: >89 mL/min
GFR, Est Non African American: >89 mL/min
Glucose, Bld: 88 mg/dL (ref 70–99)
Potassium: 4.1 mEq/L (ref 3.5–5.3)
Sodium: 144 mEq/L (ref 135–145)
Total Protein: 7.2 g/dL (ref 6.0–8.3)

## 2014-01-07 LAB — POCT URINALYSIS DIPSTICK
Bilirubin, UA: NEGATIVE
Glucose, UA: NEGATIVE
Ketones, UA: NEGATIVE
LEUKOCYTES UA: NEGATIVE
NITRITE UA: NEGATIVE
PH UA: 6
Protein, UA: NEGATIVE
Spec Grav, UA: 1.025
Urobilinogen, UA: 0.2

## 2014-01-07 LAB — LIPID PANEL
Cholesterol: 148 mg/dL (ref 0–200)
HDL: 58 mg/dL (ref >39)
LDL Cholesterol: 80 mg/dL (ref 0–99)
TRIGLYCERIDES: 49 mg/dL (ref <150)
Total CHOL/HDL Ratio: 2.6 Ratio
VLDL: 10 mg/dL (ref 0–40)

## 2014-01-07 LAB — HIV ANTIBODY (ROUTINE TESTING W REFLEX): HIV: NONREACTIVE

## 2014-01-07 LAB — HEPATITIS PANEL, ACUTE
HCV Ab: NEGATIVE
Hep A IgM: NONREACTIVE
Hep B C IgM: NONREACTIVE
Hepatitis B Surface Ag: NEGATIVE

## 2014-01-07 LAB — CBC
HCT: 42.6 % (ref 36.0–46.0)
HEMOGLOBIN: 15.1 g/dL — AB (ref 12.0–15.0)
MCH: 30.8 pg (ref 26.0–34.0)
MCHC: 35.4 g/dL (ref 30.0–36.0)
MCV: 86.8 fL (ref 78.0–100.0)
PLATELETS: 252 10*3/uL (ref 150–400)
RBC: 4.91 MIL/uL (ref 3.87–5.11)
RDW: 12.2 % (ref 11.5–15.5)
WBC: 5.5 10*3/uL (ref 4.0–10.5)

## 2014-01-07 LAB — TSH: TSH: 0.794 u[IU]/mL (ref 0.350–4.500)

## 2014-01-07 LAB — VITAMIN B12: Vitamin B-12: 782 pg/mL (ref 211–911)

## 2014-01-07 LAB — POCT GLYCOSYLATED HEMOGLOBIN (HGB A1C): HEMOGLOBIN A1C: 5.1

## 2014-01-07 MED ORDER — CLONAZEPAM 1 MG PO TABS: 1.0000 mg | ORAL_TABLET | Freq: Every day | ORAL | Status: DC

## 2014-01-07 MED ORDER — DEXLANSOPRAZOLE 60 MG PO CPDR: 60.0000 mg | DELAYED_RELEASE_CAPSULE | Freq: Every day | ORAL | Status: DC

## 2014-01-07 MED ORDER — ESCITALOPRAM OXALATE 20 MG PO TABS: 20.0000 mg | ORAL_TABLET | Freq: Every day | ORAL | Status: DC

## 2014-01-07 NOTE — Progress Notes (Signed)
Subjective:    Patient ID: Angela Morgan, female    DOB: 12-19-1970, 43 y.o.   MRN: [id number]  HPI This 43 y.o. female presents for evaluation for Complete Physical Examination.   Last physical unknown. Pap smear 06/8294 by Elmo Putt Copland.  Switched to bio-identical hormones. Mammogram never.  OK with [place].   Colonoscopy 2003.  Dx IBS.  TDAP 01/06/2011. Hepatitis A series 2010. Hepatitis B series 2010. Flu vaccine 09/05/2013. Eye exam due; 2014.  Phillip Heal; [place]. Dental exam 2014.  Depression with anxiety: followed by Dr. Collie Siad; followed by Malachy Mood regularly.  Last visit by Dr. Collie Siad prescribed Cymbalta but cannot tolerate it.  Now taking Lexapro sporadically.  If stops Lexapro completely, dizziness recurs.  Takes Lexapro 30mg  twice weekly.  Cymbalta made pt feel horrible/groggy.  Last visit with Collie Siad in April. Gloom and doom gone.  Moved out from husband 09/2013; took 45 days to get a job, get an apartment, move out.  Put son at [facility].  Son is very happy.    Vestibular neurititis:  Unchanged from last visit; denied disability; will have dizzy free days.  Has suffered with two episodes of severe dizziness in past three months.    Exposure to communicable diseases:  Suffered needlestick with employment 15 years ago; requesting HIV testing and hepatitis screening.  Tb screening: needs Tb screening for employment.    Tuberculosis Risk Questionnaire  1. No Were you born outside the [place] in one of the following parts of the world: [place], [place], [place], [place] or [place]?    2. Yes [place] in 2012. Have you traveled outside the [place] and lived for more than one month in one of the following parts of the world: [place], [place], [place], [place] or [place]?    3. No Do you have a compromised immune system such as from any of the following conditions:HIV/AIDS, organ or bone marrow transplantation, diabetes, immunosuppressive medicines (e.g.  Prednisone, Remicaide), leukemia, lymphoma, cancer of the head or neck, gastrectomy or jejunal bypass, end-stage renal disease (on dialysis), or silicosis?     4. No Have you ever or do you plan on working in: a residential care center, a health care facility, a jail or prison or homeless shelter?    5. No Have you ever: injected illegal drugs, used crack cocaine, lived in a homeless shelter  or been in jail or prison?     6. No Have you ever been exposed to anyone with infectious tuberculosis?    Tuberculosis Symptom Questionnaire  Do you currently have any of the following symptoms?  1. No Unexplained cough lasting more than 3 weeks?   2. No Unexplained fever lasting more than 3 weeks.   3. No Night Sweats (sweating that leaves the bedclothes and sheets wet)     4. No Shortness of Breath   5. No Chest Pain   6. No Unintentional weight loss    7. No Unexplained fatigue (very tired for no reason)   Review of Systems  Constitutional: Negative.   Eyes: Negative.   Respiratory: Negative.   Cardiovascular: Negative.   Gastrointestinal: Negative.   Endocrine: Positive for cold intolerance.  Genitourinary: Negative.   Musculoskeletal: Positive for arthralgias and joint swelling.  Skin: Negative.   Allergic/Immunologic: Positive for environmental allergies.  Neurological: Positive for dizziness and headaches.  Hematological: Negative.   Psychiatric/Behavioral: The patient is nervous/anxious.    Past Medical History  Diagnosis Date  . Anxiety   .  Depression   . GERD (gastroesophageal reflux disease)   . Migraine, unspecified, without mention of intractable migraine without mention of status migrainosus   . Arthropathy, unspecified, site unspecified   . Allergic rhinitis, cause unspecified   . Situational, disturbance, acute   . Vestibular dysfunction   . Vestibular neuritis   . Insomnia, unspecified   . Symptomatic states associated with artificial menopause   .  Urticaria, unspecified   . Depressive disorder, not elsewhere classified   . Adjustment disorder with depressed mood   . Fibromyalgia   . Postablative ovarian failure   . Irritable bowel syndrome   . Cellulitis 11/2007    Orbital Shingles   Hospitalized   Past Surgical History  Procedure Laterality Date  . Total abdominal hysterectomy      ovaries removed  . Knee surgery    . Temporomandibular joint surgery    . Appendectomy    . Gallbladder surgery    . Cesarean section      x 2  . Cholecystectomy     Allergies  Allergen Reactions  . Biaxin [Clarithromycin]    Current Outpatient Prescriptions on File Prior to Visit  Medication Sig Dispense Refill  . estradiol (ESTRACE) 2 MG tablet TAKE ONE TABLET BY MOUTH EVERY DAY  30 tablet  2  . gabapentin (NEURONTIN) 300 MG capsule Take 600 mg by mouth daily.       Marland Kitchen ibuprofen (ADVIL,MOTRIN) 800 MG tablet Take 800 mg by mouth every 8 (eight) hours as needed for pain.      . NON FORMULARY 1 suppository by Other route every other day. Estrogen/testosterone suppository/ pill every other day.       No current facility-administered medications on file prior to visit.   History   Social History  . Marital Status: Legally Separated    Spouse Name: n/a    Number of Children: 2  . Years of Education: college   Occupational History  . 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 Smoking status: Never Smoker   . Smokeless tobacco: Never Used  . Alcohol Use: 2.4 oz/week    4 Glasses of wine per week  . Drug Use: No  . Sexual Activity: Yes   Other Topics Concern  . Not on file   Social History Narrative   Marital Status:separated in 09/2013 after married x  16+ years. Dating casually.      Children:  2 children (21, 16); no grandchildren.    Persons living in apartment with son.      Employment:  Working at [employer] since 08/2013. [place] job.   Exercise: sporadic  walking.   Always uses seat belts / helmet. Smoke alarm in the home. No guns in the home.   Caffeine use: moderate      Separated   Chartered loss adjuster and Proofreader   Exercisis                     Family History  Problem Relation Age of Onset  . Diabetes Maternal Grandfather   . Hypertension Mother   . Stroke Mother   . COPD Mother   . Aneurysm Mother     brain  . Transient ischemic attack Mother     multiple  . Hyperlipidemia Mother   . Hypertension Father   . Heart disease Father   . Mental illness Father   .  Depression Father   . Hyperlipidemia Father   . Hyperlipidemia Brother   . Hypertension Brother        Objective:   Physical Exam  Nursing note and vitals reviewed. Constitutional: She is oriented to person, place, and time. She appears well-developed and well-nourished. No distress.  HENT:  Head: Normocephalic and atraumatic.  Right Ear: External ear normal.  Left Ear: External ear normal.  Nose: Nose normal.  Mouth/Throat: Oropharynx is clear and moist.  Eyes: Conjunctivae and EOM are normal. Pupils are equal, round, and reactive to light.  Neck: Normal range of motion. Neck supple. No JVD present. No thyromegaly present.  Cardiovascular: Normal rate, regular rhythm, normal heart sounds and intact distal pulses.  Exam reveals no gallop and no friction rub.   No murmur heard. Pulmonary/Chest: Effort normal and breath sounds normal. She has no wheezes. She has no rales.  Abdominal: Soft. Bowel sounds are normal. She exhibits no distension and no mass. There is no tenderness. There is no rebound and no guarding.  Genitourinary: No breast swelling, tenderness, discharge or bleeding. Pelvic exam was performed with patient supine.  Musculoskeletal:       Right shoulder: Normal.       Left shoulder: Normal.       Cervical back: Normal.       Thoracic back: Normal.       Lumbar back: Normal.  Lymphadenopathy:    She  has no cervical adenopathy.  Neurological: She is alert and oriented to person, place, and time. She has normal reflexes. No cranial nerve deficit. She exhibits normal muscle tone. Coordination normal.  Skin: Skin is warm and dry. No rash noted. She is not diaphoretic. No erythema. No pallor.  Psychiatric: She has a normal mood and affect. Her behavior is normal. Judgment and thought content normal.   Results for orders placed in visit on 01/07/14  POCT GLYCOSYLATED HEMOGLOBIN (HGB A1C)      Result Value Range   Hemoglobin A1C 5.1    POCT URINALYSIS DIPSTICK      Result Value Range   Color, UA yellow     Clarity, UA clear     Glucose, UA neg     Bilirubin, UA neg     Ketones, UA neg     Spec Grav, UA 1.025     Blood, UA mod     pH, UA 6.0     Protein, UA neg     Urobilinogen, UA 0.2     Nitrite, UA neg     Leukocytes, UA Negative     Tb skin test placed.    Assessment & Plan:  Routine general medical examination at a health care facility - Plan: CBC, POCT glycosylated hemoglobin (Hb A1C), COMPLETE METABOLIC PANEL WITH GFR, TSH, Lipid panel, Vitamin B12, Vitamin D, 25-hydroxy, POCT urinalysis dipstick  Breast cancer screening - Plan: MM Digital Screening  Screening-pulmonary TB - Plan: TB Skin Test  Exposure to communicable disease - Plan: HIV antibody, Hepatitis panel, acute  Depression with anxiety  Vestibular neuritis  1. CPE: anticipatory guidance provided --- continued weight loss and exercise.  Pap smear UTD per gynecology; refer for mammogram.  Immunizations UTD. Obtain labs. 2.  Breast cancer screening: breast exam completed; refer for mammogram. 3.  Tb screening: PPD placed; RTC 48-72 hours for read. 4.  Exposure to communicable diseases:  S/p needlestick in past; requesting HIV,hepatitis screening. 5.  Depression with anxiety:  Improved; continue Lexapro 20mg  daily, Klonopin qhs PRN.  Follow-up in six months.  Undergoing separation. 6. Vestibular neuritis:   Stable; denied disability.  Meds ordered this encounter  Medications  . clonazePAM (KLONOPIN) 1 MG tablet    Sig: Take 1 tablet (1 mg total) by mouth at bedtime.    Dispense:  30 tablet    Refill:  5  . dexlansoprazole (DEXILANT) 60 MG capsule    Sig: Take 1 capsule (60 mg total) by mouth daily.    Dispense:  30 capsule    Refill:  11  . escitalopram (LEXAPRO) 20 MG tablet    Sig: Take 1 tablet (20 mg total) by mouth daily.    Dispense:  30 tablet    Refill:  5   [name], M.D.  [facility] [address] [place], [place]  [address] [phone] phone [phone] fax

## 2014-01-07 NOTE — Patient Instructions (Signed)
1.  PLEASE CALL [facility] FOR AN APPOINTMENT FOR MAMMOGRAM.

## 2014-01-08 LAB — VITAMIN D 25 HYDROXY (VIT D DEFICIENCY, FRACTURES): VIT D 25 HYDROXY: 49 ng/mL (ref 30–89)

## 2014-01-09 ENCOUNTER — Ambulatory Visit: Payer: Self-pay | Admitting: Family Medicine

## 2014-01-09 ENCOUNTER — Encounter: Payer: Self-pay | Admitting: Family Medicine

## 2014-01-09 ENCOUNTER — Encounter (INDEPENDENT_AMBULATORY_CARE_PROVIDER_SITE_OTHER): Payer: BC Managed Care – PPO | Admitting: Family Medicine

## 2014-01-09 ENCOUNTER — Telehealth: Payer: Self-pay

## 2014-01-09 ENCOUNTER — Other Ambulatory Visit: Payer: Self-pay | Admitting: Family Medicine

## 2014-01-09 DIAGNOSIS — Z111 Encounter for screening for respiratory tuberculosis: Secondary | ICD-10-CM

## 2014-01-09 DIAGNOSIS — R319 Hematuria, unspecified: Secondary | ICD-10-CM

## 2014-01-09 LAB — POCT UA - MICROSCOPIC ONLY
Bacteria, U Microscopic: NEGATIVE
CRYSTALS, UR, HPF, POC: NEGATIVE
Casts, Ur, LPF, POC: NEGATIVE
Mucus, UA: NEGATIVE
WBC, Ur, HPF, POC: NEGATIVE
Yeast, UA: NEGATIVE

## 2014-01-09 LAB — TB SKIN TEST
INDURATION: 0 mm
TB Skin Test: NEGATIVE

## 2014-01-09 LAB — POCT URINALYSIS DIPSTICK
Bilirubin, UA: NEGATIVE
Glucose, UA: NEGATIVE
KETONES UA: NEGATIVE
LEUKOCYTES UA: NEGATIVE
Nitrite, UA: NEGATIVE
Protein, UA: NEGATIVE
Spec Grav, UA: 1.01
UROBILINOGEN UA: 0.2
pH, UA: 7

## 2014-01-09 NOTE — Telephone Encounter (Signed)
Spoke with Estill Bamberg ---- pt presented for additional imaging; presented for mammogram 2/2 at 2:40pm; needed additional images; Dr. Glennon Mac reviewed additional imaging; recommending u/s core biopsy. Verbal OK provided.

## 2014-01-09 NOTE — Telephone Encounter (Signed)
Estill Bamberg from [facility] would like to speak with Dr.Smith regarding a biopsy for this patient. Best# [phone] Estill Bamberg)

## 2014-01-10 ENCOUNTER — Encounter: Payer: Self-pay | Admitting: Family Medicine

## 2014-01-15 ENCOUNTER — Telehealth: Payer: Self-pay

## 2014-01-15 ENCOUNTER — Other Ambulatory Visit: Payer: Self-pay

## 2014-01-15 ENCOUNTER — Ambulatory Visit: Payer: Self-pay | Admitting: Family Medicine

## 2014-01-15 DIAGNOSIS — N6320 Unspecified lump in the left breast, unspecified quadrant: Secondary | ICD-10-CM

## 2014-01-15 NOTE — Telephone Encounter (Signed)
Per Dr Thompson Caul authorization in 01/09/14 phone message, placed order in EPIC for [place] Core biopsy L breast and Tim Lair will fax to [place].

## 2014-01-15 NOTE — Telephone Encounter (Signed)
Patient is going to be seen at [facility] for an ultrasound core biopsy left breast, left breast mass. Patient was referred by Dr. Tamala Julian. Almyra Free from [place] is needing our office to fax the orders [phone]) before 1pm for patients appointment. Spoke to [place] and she will put in epic.

## 2014-01-16 LAB — PATHOLOGY REPORT

## 2014-03-14 ENCOUNTER — Encounter: Payer: Self-pay | Admitting: Family Medicine

## 2014-03-19 ENCOUNTER — Ambulatory Visit: Payer: BC Managed Care – PPO

## 2014-03-19 ENCOUNTER — Ambulatory Visit (INDEPENDENT_AMBULATORY_CARE_PROVIDER_SITE_OTHER): Payer: BC Managed Care – PPO | Admitting: Family Medicine

## 2014-03-19 VITALS — BP 100/60 | HR 60 | Temp 98.7°F | Resp 16 | Ht 65.0 in | Wt 147.0 lb

## 2014-03-19 DIAGNOSIS — M542 Cervicalgia: Secondary | ICD-10-CM

## 2014-03-19 DIAGNOSIS — M25519 Pain in unspecified shoulder: Secondary | ICD-10-CM

## 2014-03-19 DIAGNOSIS — R5381 Other malaise: Secondary | ICD-10-CM

## 2014-03-19 DIAGNOSIS — M25512 Pain in left shoulder: Secondary | ICD-10-CM

## 2014-03-19 DIAGNOSIS — R5383 Other fatigue: Secondary | ICD-10-CM

## 2014-03-19 DIAGNOSIS — R51 Headache: Secondary | ICD-10-CM

## 2014-03-19 LAB — POCT CBC
Granulocyte percent: 54.3 %G (ref 37–80)
HCT, POC: 43.3 % (ref 37.7–47.9)
HEMOGLOBIN: 13.9 g/dL (ref 12.2–16.2)
LYMPH, POC: 2.5 (ref 0.6–3.4)
MCH: 30 pg (ref 27–31.2)
MCHC: 32.1 g/dL (ref 31.8–35.4)
MCV: 93.4 fL (ref 80–97)
MID (CBC): 0.5 (ref 0–0.9)
MPV: 8.9 fL (ref 0–99.8)
POC Granulocyte: 3.5 (ref 2–6.9)
POC LYMPH PERCENT: 38.4 %L (ref 10–50)
POC MID %: 7.3 %M (ref 0–12)
Platelet Count, POC: 296 10*3/uL (ref 142–424)
RBC: 4.64 M/uL (ref 4.04–5.48)
RDW, POC: 12.7 %
WBC: 6.5 10*3/uL (ref 4.6–10.2)

## 2014-03-19 LAB — POCT URINALYSIS DIPSTICK
Bilirubin, UA: NEGATIVE
GLUCOSE UA: NEGATIVE
Ketones, UA: NEGATIVE
Leukocytes, UA: NEGATIVE
Nitrite, UA: NEGATIVE
Protein, UA: NEGATIVE
SPEC GRAV UA: 1.02
Urobilinogen, UA: 0.2
pH, UA: 6.5

## 2014-03-19 LAB — POCT UA - MICROSCOPIC ONLY
Casts, Ur, LPF, POC: NEGATIVE
Crystals, Ur, HPF, POC: NEGATIVE
MUCUS UA: POSITIVE
Yeast, UA: NEGATIVE

## 2014-03-19 MED ORDER — TRAMADOL HCL 50 MG PO TABS: 50.0000 mg | ORAL_TABLET | Freq: Three times a day (TID) | ORAL | Status: DC | PRN

## 2014-03-19 MED ORDER — CYCLOBENZAPRINE HCL 5 MG PO TABS: 5.0000 mg | ORAL_TABLET | Freq: Every evening | ORAL | Status: DC | PRN

## 2014-03-19 NOTE — Patient Instructions (Signed)

## 2014-03-19 NOTE — Progress Notes (Addendum)
Subjective:  This chart was scribed for Angela Forts, MD by Donato Schultz, Medical Scribe. This patient was seen in Room 4 and the patient's care was started at 9:26 PM.   Patient ID: Angela Morgan, female    DOB: [birth year], 43 y.o.   MRN: [id number]  HPI HPI Comments: Angela Morgan is a 43 y.o. female who presents to the [facility] complaining of constant headache, neck pain, and burning, sore left shoulder pain.  The patient states that her headache started on Friday night, the neck pain started on Sunday night, and last night she heard a pop in her left shoulder when she rolled over in bed.    She denies fever, chills, diaphoresis, diarrhea, abdominal pain, dysuria, sore throat, rhinorrhea, cough, photophobia, nausea, vomiting, numbness and tingling in her arms bilaterally, vomiting, double vision, blurred vision, as an associated symptom.  She lists phonophobia, sneezing, and intermittent chronic dizziness as associated symptoms.  She states that her shoulder pain is bothering her the most.  She states that her neck and shoulder are stiff.  The patient states that she tried 800 mg of Advil and Tramadol for her headache with mild relief to her headache.  She states that she has taken 800 mg of Ibuprofen at 3 PM today with no relief to her symptoms.  She denies being more stressed out than usual.  She states that she worked out yesterday and did not do any new exercises.  She states that she has been drinking water constantly.  She denies any change in her medications.  The patient states that she has been sleeping normally.  She feels badly.   Past Medical History  Diagnosis Date  . Anxiety   . Depression   . GERD (gastroesophageal reflux disease)   . Migraine, unspecified, without mention of intractable migraine without mention of status migrainosus   . Arthropathy, unspecified, site unspecified   . Allergic rhinitis, cause unspecified   . Situational, disturbance,  acute   . Vestibular dysfunction   . Vestibular neuritis   . Insomnia, unspecified   . Symptomatic states associated with artificial menopause   . Urticaria, unspecified   . Depressive disorder, not elsewhere classified   . Adjustment disorder with depressed mood   . Fibromyalgia   . Postablative ovarian failure   . Irritable bowel syndrome   . Cellulitis 11/2007    Orbital Shingles   Hospitalized   Past Surgical History  Procedure Laterality Date  . Total abdominal hysterectomy      ovaries removed  . Knee surgery    . Temporomandibular joint surgery    . Appendectomy    . Cesarean section      x 2  . Cholecystectomy     Family History  Problem Relation Age of Onset  . Diabetes Maternal Grandfather   . Hypertension Mother   . Stroke Mother   . COPD Mother   . Aneurysm Mother     brain  . Transient ischemic attack Mother     multiple  . Hyperlipidemia Mother   . Hypertension Father   . Heart disease Father   . Mental illness Father   . Depression Father   . Hyperlipidemia Father   . Hyperlipidemia Brother   . Hypertension Brother    History   Social History  . Marital Status: Legally Separated    Spouse Name: n/a    Number of Children: 2  . Years of Education: college  Occupational History  . 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 Smoking status: Never Smoker   . Smokeless tobacco: Never Used  . Alcohol Use: 2.4 oz/week    4 Glasses of wine per week  . Drug Use: No  . Sexual Activity: Yes    Birth Control/ Protection: Post-menopausal   Other Topics Concern  . Not on file   Social History Narrative   Marital Status:separated in 09/2013 after married x  16+ years. Dating casually.      Children:  2 children (21, 16); no grandchildren.       Living:  living in apartment with son.      Employment:  Working at [employer] since 08/2013. [place] job.      Exercise: exercising at gym  3-6 days per week.      Always uses seat belts / helmet.       Smoke alarm in the home.       No guns in the home.     Caffeine use: moderate      Education: College                           Allergies  Allergen Reactions  . Biaxin [Clarithromycin]     Review of Systems  Constitutional: Negative for fever, chills and diaphoresis.  HENT: Positive for sneezing. Negative for rhinorrhea and sore throat.   Eyes: Negative for photophobia and visual disturbance.  Respiratory: Negative for cough.   Gastrointestinal: Negative for nausea, vomiting, abdominal pain and diarrhea.  Genitourinary: Negative for dysuria.  Musculoskeletal: Positive for arthralgias (left shoulder), neck pain and neck stiffness.  Neurological: Positive for dizziness and headaches. Negative for weakness and numbness.  All other systems reviewed and are negative.    Objective:  Physical Exam  Nursing note and vitals reviewed. Constitutional: She is oriented to person, place, and time. She appears well-developed and well-nourished. No distress.  HENT:  Head: Normocephalic and atraumatic.  Right Ear: External ear normal.  Left Ear: External ear normal.  Nose: Nose normal.  Mouth/Throat: Oropharynx is clear and moist.  Eyes: Conjunctivae and EOM are normal. Pupils are equal, round, and reactive to light.  Neck: Normal range of motion. Neck supple. Carotid bruit is not present. No thyromegaly present.  Cardiovascular: Normal rate, regular rhythm, normal heart sounds and intact distal pulses.  Exam reveals no gallop and no friction rub.   No murmur heard. Pulmonary/Chest: Effort normal and breath sounds normal. No respiratory distress. She has no wheezes. She has no rales. She exhibits no tenderness.  Abdominal: Soft. Bowel sounds are normal. She exhibits no distension and no mass. There is no tenderness. There is no rebound and no guarding.  Musculoskeletal:       Right shoulder: Normal.       Left shoulder:  She exhibits decreased range of motion, tenderness, bony tenderness, pain and decreased strength. She exhibits no swelling, no spasm and normal pulse.       Cervical back: She exhibits tenderness, bony tenderness, pain and spasm. She exhibits normal range of motion.  Cervical spine:  Midline tenderness on her cervical spine. +TTP L trapezius region; +TTP B paraspinal regions.  No occipital ridge TTP.  Full ROM of cervical spine with pain but not limited. Motor 5/5 BUE. Grip 5/5.  L shoulder: + Tenderness in the anterior left shoulder.  Pain with  elevation to 70 degrees.  Empty can positive; cross over negative.  Lymphadenopathy:    She has no cervical adenopathy.  Neurological: She is alert and oriented to person, place, and time. No cranial nerve deficit. She exhibits normal muscle tone. Coordination normal.  Skin: Skin is warm and dry. No rash noted. She is not diaphoretic. No erythema. No pallor.  Psychiatric: She has a normal mood and affect. Her behavior is normal.   Results for orders placed in visit on 03/19/14  POCT CBC      Result Value Ref Range   WBC 6.5  4.6 - 10.2 K/uL   Lymph, poc 2.5  0.6 - 3.4   POC LYMPH PERCENT 38.4  10 - 50 %L   MID (cbc) 0.5  0 - 0.9   POC MID % 7.3  0 - 12 %M   POC Granulocyte 3.5  2 - 6.9   Granulocyte percent 54.3  37 - 80 %G   RBC 4.64  4.04 - 5.48 M/uL   Hemoglobin 13.9  12.2 - 16.2 g/dL   HCT, POC 43.3  37.7 - 47.9 %   MCV 93.4  80 - 97 fL   MCH, POC 30.0  27 - 31.2 pg   MCHC 32.1  31.8 - 35.4 g/dL   RDW, POC 12.7     Platelet Count, POC 296  142 - 424 K/uL   MPV 8.9  0 - 99.8 fL  POCT URINALYSIS DIPSTICK      Result Value Ref Range   Color, UA yellow     Clarity, UA clear     Glucose, UA negative     Bilirubin, UA negative     Ketones, UA negative     Spec Grav, UA 1.020     Blood, UA small     pH, UA 6.5     Protein, UA negative     Urobilinogen, UA 0.2     Nitrite, UA negative     Leukocytes, UA Negative    POCT UA - MICROSCOPIC  ONLY      Result Value Ref Range   WBC, Ur, HPF, POC 0-1     RBC, urine, microscopic 1-2     Bacteria, U Microscopic trace     Mucus, UA positive     Epithelial cells, urine per micros 0-2     Crystals, Ur, HPF, POC negative     Casts, Ur, LPF, POC negative     Yeast, UA negative     [facility] reading (PRIMARY) by  Dr. Tamala Julian.  L SHOULDER: NAD.    BP 100/60  Pulse 60  Temp(Src) 98.7 F (37.1 C) (Oral)  Resp 16  Ht 5\' 5"  (1.651 m)  Wt 147 lb (66.679 kg)  BMI 24.46 kg/m2  SpO2 99% Assessment & Plan:  Left shoulder pain - Plan: DG Shoulder Left  Headache(784.0) - Plan: POCT CBC, POCT urinalysis dipstick, POCT UA - Microscopic Only, Comprehensive metabolic panel, Epstein-Barr virus VCA antibody panel  Neck pain  Other malaise and fatigue - Plan: POCT CBC, POCT urinalysis dipstick, POCT UA - Microscopic Only, Comprehensive metabolic panel, Epstein-Barr virus VCA antibody panel, Urine culture  1.  L shoulder pain:  New.  Continue Ibuprofen 800mg  tid scheduled for one week; Flexeril 5mg  qhs provided; Tramadol 50mg  PRN provided.  Home exercise program provided to stat in 72 hours; ice shoulder twice daily.  If no improvement in two weeks, call for ortho referral to [facility]. 2.  Neck pain/strain:  New.  Continue Ibuprofen; rx for Flexeril and Tramadol. Home exercise program provided to start daily; apply heat twice daily. 3.  Headache:  New.  Not consistent with migraine; associated with malaise and fatigue. Normal neurological exam; normal CBC, u/a.  Possible viral syndrome.  Supportive care with rest, Ibuprofen, hydration. Avoid exercising while feeling badly. OOW note x 3 days. 4.  Malaise and fatigue:  New. Associated with HA.  Supportive care with rest, fluids, Ibuprofen.  Obtain Urine culture and EBV titers.  RTC for acute worsening.  Meds ordered this encounter  Medications  . cyclobenzaprine (FLEXERIL) 5 MG tablet    Sig: Take 1 tablet (5 mg total) by mouth at bedtime as needed for  muscle spasms.    Dispense:  30 tablet    Refill:  0  . traMADol (ULTRAM) 50 MG tablet    Sig: Take 1 tablet (50 mg total) by mouth every 8 (eight) hours as needed.    Dispense:  30 tablet    Refill:  0     I personally performed the services described in this documentation, which was scribed in my presence.  The recorded information has been reviewed and is accurate.  Angela Morgan, M.D.  [facility] [address] [place], [place]  [address] [phone] phone [phone] fax

## 2014-03-20 LAB — COMPREHENSIVE METABOLIC PANEL
ALBUMIN: 4.4 g/dL (ref 3.5–5.2)
ALT: 11 U/L (ref 0–35)
AST: 14 U/L (ref 0–37)
Alkaline Phosphatase: 68 U/L (ref 39–117)
BILIRUBIN TOTAL: 0.4 mg/dL (ref 0.2–1.2)
BUN: 17 mg/dL (ref 6–23)
CO2: 29 mEq/L (ref 19–32)
Calcium: 9.7 mg/dL (ref 8.4–10.5)
Chloride: 105 mEq/L (ref 96–112)
Creat: 0.88 mg/dL (ref 0.50–1.10)
Glucose, Bld: 89 mg/dL (ref 70–99)
Potassium: 3.8 mEq/L (ref 3.5–5.3)
SODIUM: 142 meq/L (ref 135–145)
Total Protein: 7 g/dL (ref 6.0–8.3)

## 2014-03-21 LAB — EPSTEIN-BARR VIRUS VCA ANTIBODY PANEL
EBV EA IgG: <5 U/mL (ref <9.0)
EBV NA IgG: 542 U/mL — ABNORMAL HIGH (ref <18.0)
EBV VCA IGG: 472 U/mL — AB (ref <18.0)
EBV VCA IgM: <10 U/mL (ref <36.0)

## 2014-03-21 LAB — URINE CULTURE

## 2014-04-04 ENCOUNTER — Encounter: Payer: Self-pay | Admitting: Family Medicine

## 2014-04-18 ENCOUNTER — Encounter: Payer: Self-pay | Admitting: Family Medicine

## 2014-04-19 ENCOUNTER — Telehealth: Payer: Self-pay

## 2014-04-19 NOTE — Telephone Encounter (Signed)
Patient is requesting a copy of her x-ray from April 2015 to take to orthopedic on Monday.

## 2014-04-19 NOTE — Telephone Encounter (Signed)
Printed and given to X-ray to make copy.    Called patient and LMOV for pick up.

## 2014-07-08 ENCOUNTER — Ambulatory Visit (INDEPENDENT_AMBULATORY_CARE_PROVIDER_SITE_OTHER): Payer: BC Managed Care – PPO | Admitting: Family Medicine

## 2014-07-08 ENCOUNTER — Encounter: Payer: Self-pay | Admitting: Family Medicine

## 2014-07-08 ENCOUNTER — Ambulatory Visit: Payer: Self-pay | Admitting: Family Medicine

## 2014-07-08 ENCOUNTER — Telehealth: Payer: Self-pay

## 2014-07-08 VITALS — BP 120/88 | HR 57 | Temp 97.8°F | Resp 16 | Ht 64.25 in | Wt 144.4 lb

## 2014-07-08 DIAGNOSIS — R82998 Other abnormal findings in urine: Secondary | ICD-10-CM

## 2014-07-08 DIAGNOSIS — F418 Other specified anxiety disorders: Secondary | ICD-10-CM

## 2014-07-08 DIAGNOSIS — R8290 Unspecified abnormal findings in urine: Secondary | ICD-10-CM

## 2014-07-08 DIAGNOSIS — N63 Unspecified lump in unspecified breast: Secondary | ICD-10-CM

## 2014-07-08 DIAGNOSIS — H8120 Vestibular neuronitis, unspecified ear: Secondary | ICD-10-CM

## 2014-07-08 DIAGNOSIS — N6320 Unspecified lump in the left breast, unspecified quadrant: Secondary | ICD-10-CM

## 2014-07-08 DIAGNOSIS — F341 Dysthymic disorder: Secondary | ICD-10-CM

## 2014-07-08 DIAGNOSIS — H8123 Vestibular neuronitis, bilateral: Secondary | ICD-10-CM

## 2014-07-08 LAB — POCT UA - MICROSCOPIC ONLY
Casts, Ur, LPF, POC: NEGATIVE
Crystals, Ur, HPF, POC: NEGATIVE
MUCUS UA: POSITIVE
Yeast, UA: NEGATIVE

## 2014-07-08 LAB — COMPREHENSIVE METABOLIC PANEL
ALT: 15 U/L (ref 0–35)
AST: 18 U/L (ref 0–37)
Albumin: 4.4 g/dL (ref 3.5–5.2)
Alkaline Phosphatase: 61 U/L (ref 39–117)
BILIRUBIN TOTAL: 0.6 mg/dL (ref 0.2–1.2)
BUN: 10 mg/dL (ref 6–23)
CO2: 23 meq/L (ref 19–32)
CREATININE: 0.8 mg/dL (ref 0.50–1.10)
Calcium: 9.5 mg/dL (ref 8.4–10.5)
Chloride: 103 mEq/L (ref 96–112)
Glucose, Bld: 87 mg/dL (ref 70–99)
Potassium: 4.1 mEq/L (ref 3.5–5.3)
Sodium: 137 mEq/L (ref 135–145)
Total Protein: 6.7 g/dL (ref 6.0–8.3)

## 2014-07-08 LAB — POCT URINALYSIS DIPSTICK
BILIRUBIN UA: NEGATIVE
Glucose, UA: NEGATIVE
Ketones, UA: NEGATIVE
Leukocytes, UA: NEGATIVE
NITRITE UA: NEGATIVE
Protein, UA: NEGATIVE
Spec Grav, UA: 1.02
Urobilinogen, UA: 0.2
pH, UA: 5.5

## 2014-07-08 NOTE — Progress Notes (Addendum)
Patient ID: Angela Morgan, female   DOB: 28-Oct-1971, 43 y.o.   MRN: [id number]   Subjective:  This chart was scribed for [name], MD by Donato Schultz, Medical Scribe. This patient was seen in Room 21 and the patient's care was started at 9:04 AM.   Patient ID: Angela Morgan, female    DOB: 16-Jun-1971, 43 y.o.   MRN: [id number]  07/08/2014  Follow-up, lump in breast, urine odor and Anxiety   HPI HPI Comments: Angela Morgan is a 43 y.o. female who presents to the [facility] for a six month follow-up exam.  She is no longer taking Lexapro because she feels as though she does not need it.  She has not needed to see her therapist since Christmas of last year.  She feels that she handles stress better and feels she is better emotionally.  She is sleeping well.  She takes Clonazepam sporadically and no longer takes Dexilant.  She takes Flexeril as needed.  She is still taking Estradiol and biosynthetic estrogen and progesterone.  She is planning to move in with boyfriend in upcoming two weeks; pt and her son will be moving to [place] to live with boyfriend.  Has changed schools; will be working at [employer].  Son with continue to attend [employer] for his senior year.  Daughter moved to [place] to attend college last week.  Just returned from the beach.  Has the summer off.    In June she noticed her urine has a strong odor and is very concentrated in the morning.  Her boyfriend describes the smell as "acidic".  She denies frequency, dysuria, urgency, nocturia, vaginal discharge, vaginal irritation, and vaginal itching as associated symptoms.  She has not changed her diet or started taking any vitamins.  She had similar symptoms in April and her UA was normal.  Denies leg swelling or malaise or fatigue.  Evaluated in April for fatigue/malaise; headache associated with symptoms; duration of symptoms one week.    She states that her shoulder still hurts and had seen  Dr. Veverly Fells who gave her cortisone shots and took an MRI.  She states that she was told she had an impingement and if her symptoms did not improve by December, she would have to look at getting surgery.  She is also complaining of a painful lump in her left breast, behind her nipple that she noticed a week ago while she was at the beach.  She denies nipple drainage as an associated symptom.  She denies any trauma to her left breast.  She had a biopsy in February of a benign lump in the same breast but it was not palpable.  She does not have a family history of breast cancer.  She drinks a lot of coffee, soda, and espresso.    She is still experiencing intermittent dizziness but it does not occur as frequently.  It is triggered by movies and flashing lights.  She will get a headache once a week with the dizziness.  Dizziness is much less frequent.    Her daughter moved to [place] and is starting school.  She is going to be moving to [place] to live with her boyfriend.  Her son is going to be starting school at [employer].  She will be working at [employer] this fall.    Review of Systems  Constitutional: Negative for fever, chills, diaphoresis, activity change, appetite change, fatigue and unexpected weight change.  Eyes:  Negative for visual disturbance.  Gastrointestinal: Negative for nausea, vomiting and abdominal pain.  Genitourinary: Negative for dysuria, urgency, frequency, hematuria, flank pain, decreased urine volume, vaginal discharge, genital sores, vaginal pain and dyspareunia.  Skin: Negative for color change, pallor, rash and wound.  Neurological: Positive for dizziness and headaches. Negative for tremors, seizures, syncope, speech difficulty, weakness, light-headedness and numbness.  Psychiatric/Behavioral: Negative for suicidal ideas, sleep disturbance, self-injury, dysphoric mood and decreased concentration. The patient is not nervous/anxious.     Past Medical History    Diagnosis Date   Anxiety    Depression    GERD (gastroesophageal reflux disease)    Migraine, unspecified, without mention of intractable migraine without mention of status migrainosus    Arthropathy, unspecified, site unspecified    Allergic rhinitis, cause unspecified    Situational, disturbance, acute    Vestibular dysfunction    Vestibular neuritis    Insomnia, unspecified    Symptomatic states associated with artificial menopause    Urticaria, unspecified    Depressive disorder, not elsewhere classified    Adjustment disorder with depressed mood    Fibromyalgia    Postablative ovarian failure    Irritable bowel syndrome    Cellulitis 11/2007    Orbital Shingles   Hospitalized   Past Surgical History  Procedure Laterality Date   Total abdominal hysterectomy      ovaries removed   Knee surgery     Temporomandibular joint surgery     Appendectomy     Cesarean section      x 2   Cholecystectomy     Allergies  Allergen Reactions   Biaxin [Clarithromycin]    Current Outpatient Prescriptions  Medication Sig Dispense Refill   clonazePAM (KLONOPIN) 1 MG tablet Take 1 tablet (1 mg total) by mouth at bedtime.  30 tablet  5   cyclobenzaprine (FLEXERIL) 5 MG tablet Take 1 tablet (5 mg total) by mouth at bedtime as needed for muscle spasms.  30 tablet  0   dexlansoprazole (DEXILANT) 60 MG capsule Take 1 capsule (60 mg total) by mouth daily.  30 capsule  11   diclofenac (VOLTAREN) 50 MG EC tablet Take 50 mg by mouth 2 (two) times daily.       escitalopram (LEXAPRO) 20 MG tablet Take 1 tablet (20 mg total) by mouth daily.  30 tablet  5   estradiol (ESTRACE) 2 MG tablet TAKE ONE TABLET BY MOUTH EVERY DAY  30 tablet  2   gabapentin (NEURONTIN) 300 MG capsule Take 600 mg by mouth daily.        ibuprofen (ADVIL,MOTRIN) 800 MG tablet Take 800 mg by mouth every 8 (eight) hours as needed for pain.       NON FORMULARY 1 suppository by Other route every  other day. Estrogen/testosterone suppository/ pill every other day.       traMADol (ULTRAM) 50 MG tablet Take 1 tablet (50 mg total) by mouth every 8 (eight) hours as needed.  30 tablet  0   No current facility-administered medications for this visit.   Family History  Problem Relation Age of Onset   Diabetes Maternal Grandfather    Hypertension Mother    Stroke Mother    COPD Mother    Aneurysm Mother     brain   Transient ischemic attack Mother     multiple   Hyperlipidemia Mother    Hypertension Father    Heart disease Father    Mental illness Father    Depression Father  Hyperlipidemia Father    Hyperlipidemia Brother    Hypertension Brother      Objective:   BP 120/88   Pulse 57   Temp(Src) 97.8 F (36.6 C) (Oral)   Resp 16   Ht 5' 4.25" (1.632 m)   Wt 144 lb 6.4 oz (65.499 kg)   BMI 24.59 kg/m2   SpO2 99%  Physical Exam  Nursing note and vitals reviewed. Constitutional: She is oriented to person, place, and time. She appears well-developed and well-nourished.  HENT:  Head: Normocephalic and atraumatic.  Eyes: Conjunctivae and EOM are normal. Pupils are equal, round, and reactive to light.  Neck: Normal range of motion. Neck supple. No thyromegaly present.  Cardiovascular: Normal rate, regular rhythm and normal heart sounds.  Exam reveals no gallop and no friction rub.   No murmur heard. Pulmonary/Chest: Effort normal and breath sounds normal. No respiratory distress. She has no wheezes. She has no rales. Right breast exhibits no inverted nipple, no mass, no nipple discharge, no skin change and no tenderness. Left breast exhibits mass and tenderness. Left breast exhibits no inverted nipple, no nipple discharge and no skin change. Breasts are symmetrical.    There is a mobile 8 mm mass 12 o'clock on the left breast.  Abdominal: Soft. Bowel sounds are normal. She exhibits no distension and no mass. There is no tenderness. There is no rebound and no  guarding.  Musculoskeletal: Normal range of motion.  Lymphadenopathy:    She has no cervical adenopathy.  Neurological: She is alert and oriented to person, place, and time. No cranial nerve deficit. She exhibits normal muscle tone. Coordination normal.  Skin: Skin is warm and dry. No rash noted.  Psychiatric: She has a normal mood and affect. Her behavior is normal. Judgment and thought content normal.    Results for orders placed in visit on 07/08/14  POCT URINALYSIS DIPSTICK      Result Value Ref Range   Color, UA yellow     Clarity, UA clear     Glucose, UA neg     Bilirubin, UA neg     Ketones, UA neg     Spec Grav, UA 1.020     Blood, UA small     pH, UA 5.5     Protein, UA neg     Urobilinogen, UA 0.2     Nitrite, UA neg     Leukocytes, UA Negative    POCT UA - MICROSCOPIC ONLY      Result Value Ref Range   WBC, Ur, HPF, POC 10-11     RBC, urine, microscopic 3-4     Bacteria, U Microscopic 2+     Mucus, UA pos     Epithelial cells, urine per micros 1-2     Crystals, Ur, HPF, POC neg     Casts, Ur, LPF, POC neg     Yeast, UA neg      Assessment & Plan:   1. Abnormal urine odor   2. Mass of breast, left   3. Vestibular neuritis, bilateral   4. Depression with anxiety    1.  L breast mass:  New. Send for diagnostic mammogram L and breast US L.  No family history of breast cancer.  Scheduled for imaging studies today at 11:40 at [facility]. 2.  Abnormal urine odor:  New. Send urine culture.  Increase water intake. 3.  Vestibular neuritis: improved; occurring once weekly.  S/p extensive work up in past.  S/p  vestibular rehab.   4.  Depression with anxiety: much improved; no longer taking Lexapro.  Doing well emotionally.  Meds ordered this encounter  Medications   diclofenac (VOLTAREN) 50 MG EC tablet    Sig: Take 50 mg by mouth 2 (two) times daily.    No Follow-up on file.    I personally performed the services described in this documentation,  which was scribed in my presence.  The recorded information has been reviewed and is accurate.  [name], M.D.  [facility] [address] [place], [place]  [address] [phone] phone [phone] fax

## 2014-07-08 NOTE — Telephone Encounter (Signed)
Drue Dun from [facility] called to speak to Amy, but she was not available at the time. She wanted to let Amy know that she had faxed the orders to her at fax # provided by Amy and all she has to do is have Dr Tamala Julian sign and fax back to # on cover sheet.  Also, radiologist is recommending a biopsy. She will write up that order as well and, if Dr Tamala Julian agrees, she can sign it also and fax back. HOWEVER, Drue Dun still needs a call back today to verbally tell her to go ahead and schedule the biopsy for Wed when the same radiologist will be working and pt can return. Phone # to call is [phone].

## 2014-07-08 NOTE — Telephone Encounter (Signed)
I have given this to Sharyn Lull to have Dr Tamala Julian sign and return. Have called to give verbal order for biopsy / please keep an eye out for the order to sign for the biopsy.

## 2014-07-10 ENCOUNTER — Ambulatory Visit: Payer: Self-pay | Admitting: Family Medicine

## 2014-07-10 LAB — URINE CULTURE: Colony Count: >=100000

## 2014-07-11 LAB — PATHOLOGY REPORT

## 2014-07-15 ENCOUNTER — Telehealth: Payer: Self-pay

## 2014-07-15 NOTE — Telephone Encounter (Signed)
Patient called back

## 2014-07-15 NOTE — Telephone Encounter (Signed)
PT WOULD LIKE TO SPEAK WITH DR Tamala Julian REGARDING SOME URINE ISSUES AND ALSO WANT TO GIVE HER THE RESULTS OF THE TESTS SHE HAD DONE PLEASE CALL [phone]

## 2014-07-15 NOTE — Telephone Encounter (Signed)
Dr. Tamala Julian, Please advise lab results.   LM for rtn call.

## 2014-07-15 NOTE — Telephone Encounter (Signed)
Lm for rtn call 

## 2014-07-16 MED ORDER — CIPROFLOXACIN HCL 500 MG PO TABS: 500.0000 mg | ORAL_TABLET | Freq: Two times a day (BID) | ORAL | Status: DC

## 2014-07-16 NOTE — Telephone Encounter (Signed)
Advised pt waiting for lab results to be reviewed. She wants to speak to Dr. Tamala Julian about the biopsy she had done. She would prefer a rtn call from Dr. Tamala Julian.

## 2014-07-16 NOTE — Telephone Encounter (Signed)
Left message on voicemail.  Advised of +Urine culture.  Comprehensive metabolic panel normal.  Will send in Cipro to pharmacy.

## 2014-07-16 NOTE — Telephone Encounter (Signed)
Spoke with patient; breast biopsy negative; no malignancy. Recommend repeat diagnostic mammogram in six months; this is time of screening.  Will perform screening with L diagnostic with [place] if needed same day.  Received call about pathology yesterday.  Missed call from radiology on Friday.

## 2014-08-07 ENCOUNTER — Encounter: Payer: Self-pay | Admitting: Family Medicine

## 2014-09-07 ENCOUNTER — Encounter (HOSPITAL_COMMUNITY): Payer: Self-pay | Admitting: Emergency Medicine

## 2014-09-07 ENCOUNTER — Emergency Department (HOSPITAL_COMMUNITY)
Admission: EM | Admit: 2014-09-07 | Discharge: 2014-09-08 | Disposition: A | Discharge status: Home / Self Care | Payer: BC Managed Care – PPO | Attending: Emergency Medicine | Admitting: Emergency Medicine

## 2014-09-07 ENCOUNTER — Emergency Department (HOSPITAL_COMMUNITY): Payer: BC Managed Care – PPO

## 2014-09-07 DIAGNOSIS — Z872 Personal history of diseases of the skin and subcutaneous tissue: Secondary | ICD-10-CM | POA: Diagnosis not present

## 2014-09-07 DIAGNOSIS — Z8742 Personal history of other diseases of the female genital tract: Secondary | ICD-10-CM | POA: Insufficient documentation

## 2014-09-07 DIAGNOSIS — Y9389 Activity, other specified: Secondary | ICD-10-CM | POA: Insufficient documentation

## 2014-09-07 DIAGNOSIS — S299XXA Unspecified injury of thorax, initial encounter: Secondary | ICD-10-CM | POA: Diagnosis present

## 2014-09-07 DIAGNOSIS — Z8639 Personal history of other endocrine, nutritional and metabolic disease: Secondary | ICD-10-CM | POA: Insufficient documentation

## 2014-09-07 DIAGNOSIS — Y92410 Unspecified street and highway as the place of occurrence of the external cause: Secondary | ICD-10-CM | POA: Diagnosis not present

## 2014-09-07 DIAGNOSIS — Z8669 Personal history of other diseases of the nervous system and sense organs: Secondary | ICD-10-CM | POA: Insufficient documentation

## 2014-09-07 DIAGNOSIS — Z8659 Personal history of other mental and behavioral disorders: Secondary | ICD-10-CM | POA: Diagnosis not present

## 2014-09-07 DIAGNOSIS — Z79899 Other long term (current) drug therapy: Secondary | ICD-10-CM | POA: Diagnosis not present

## 2014-09-07 DIAGNOSIS — S20211A Contusion of right front wall of thorax, initial encounter: Secondary | ICD-10-CM | POA: Insufficient documentation

## 2014-09-07 DIAGNOSIS — Z8719 Personal history of other diseases of the digestive system: Secondary | ICD-10-CM | POA: Insufficient documentation

## 2014-09-07 DIAGNOSIS — M1990 Unspecified osteoarthritis, unspecified site: Secondary | ICD-10-CM | POA: Diagnosis not present

## 2014-09-07 DIAGNOSIS — S20219A Contusion of unspecified front wall of thorax, initial encounter: Secondary | ICD-10-CM

## 2014-09-07 MED ORDER — METHOCARBAMOL 500 MG PO TABS
750.0000 mg | ORAL_TABLET | Freq: Once | ORAL | Status: AC
Start: 2014-09-07 — End: 2014-09-07
  Administered 2014-09-07: 750 mg via ORAL
  Filled 2014-09-07: qty 2

## 2014-09-07 MED ORDER — OXYCODONE-ACETAMINOPHEN 5-325 MG PO TABS
2.0000 | ORAL_TABLET | Freq: Once | ORAL | Status: AC
  Administered 2014-09-07: 2 via ORAL
  Filled 2014-09-07: qty 2
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
## 2014-09-08 MED ORDER — OXYCODONE-ACETAMINOPHEN 5-325 MG PO TABS: 1.0000 | ORAL_TABLET | Freq: Four times a day (QID) | ORAL | Status: DC | PRN

## 2014-09-08 MED ORDER — METHOCARBAMOL 750 MG PO TABS: 750.0000 mg | ORAL_TABLET | Freq: Three times a day (TID) | ORAL | Status: DC

## 2014-09-08 MED ORDER — IBUPROFEN 600 MG PO TABS: 600.0000 mg | ORAL_TABLET | Freq: Four times a day (QID) | ORAL | Status: AC | PRN

## 2014-09-08 NOTE — ED Provider Notes (Signed)
Medical screening examination/treatment/procedure(s) were performed by non-physician practitioner and as supervising physician I was immediately available for consultation/collaboration.   EKG Interpretation None        Tekisha Darcey K Marchia Diguglielmo-Rasch, MD 09/08/14 [phone]
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
## 2014-09-08 NOTE — Discharge Instructions (Signed)
Chest Contusion A chest contusion is a deep bruise on your chest area. Contusions are the result of an injury that caused bleeding under the skin. A chest contusion may involve bruising of the skin, muscles, or ribs. The contusion may turn blue, purple, or yellow. Minor injuries will give you a painless contusion, but more severe contusions may stay painful and swollen for a few weeks. CAUSES  A contusion is usually caused by a blow, trauma, or direct force to an area of the body. SYMPTOMS   Swelling and redness of the injured area.  Discoloration of the injured area.  Tenderness and soreness of the injured area.  Pain. DIAGNOSIS  The diagnosis can be made by taking a history and performing a physical exam. An X-ray, CT scan, or MRI may be needed to determine if there were any associated injuries, such as broken bones (fractures) or internal injuries. TREATMENT  Often, the best treatment for a chest contusion is resting, icing, and applying cold compresses to the injured area. Deep breathing exercises may be recommended to reduce the risk of pneumonia. Over-the-counter medicines may also be recommended for pain control. HOME CARE INSTRUCTIONS   Put ice on the injured area.  Put ice in a plastic bag.  Place a towel between your skin and the bag.  Leave the ice on for 15-20 minutes, 03-04 times a day.  Only take over-the-counter or prescription medicines as directed by your caregiver. Your caregiver may recommend avoiding anti-inflammatory medicines (aspirin, ibuprofen, and naproxen) for 48 hours because these medicines may increase bruising.  Rest the injured area.  Perform deep-breathing exercises as directed by your caregiver.  Stop smoking if you smoke.  Do not lift objects over 5 pounds (2.3 kg) for 3 days or longer if recommended by your caregiver. SEEK IMMEDIATE MEDICAL CARE IF:   You have increased bruising or swelling.  You have pain that is getting worse.  You have  difficulty breathing.  You have dizziness, weakness, or fainting.  You have blood in your urine or stool.  You cough up or vomit blood.  Your swelling or pain is not relieved with medicines. MAKE SURE YOU:   Understand these instructions.  Will watch your condition.  Will get help right away if you are not doing well or get worse. Document Released: 08/17/2001 Document Revised: 08/16/2012 Document Reviewed: 05/15/2012 [facility] Patient Information 2015 [place], [place]. This information is not intended to replace advice given to you by your health care provider. Make sure you discuss any questions you have with your health care provider. Your xray is normal

## 2014-09-11 ENCOUNTER — Ambulatory Visit (HOSPITAL_COMMUNITY)
Admission: RE | Admit: 2014-09-11 | Discharge: 2014-09-11 | Disposition: A | Discharge status: Home / Self Care | Payer: BC Managed Care – PPO | Source: Ambulatory Visit | Attending: Diagnostic Radiology | Admitting: Diagnostic Radiology

## 2014-09-11 ENCOUNTER — Encounter: Payer: Self-pay | Admitting: Family Medicine

## 2014-09-11 ENCOUNTER — Ambulatory Visit (INDEPENDENT_AMBULATORY_CARE_PROVIDER_SITE_OTHER): Payer: BC Managed Care – PPO | Admitting: Family Medicine

## 2014-09-11 ENCOUNTER — Ambulatory Visit (INDEPENDENT_AMBULATORY_CARE_PROVIDER_SITE_OTHER): Payer: BC Managed Care – PPO

## 2014-09-11 VITALS — BP 131/86 | HR 65 | Temp 97.9°F | Resp 16 | Ht 64.5 in | Wt 148.0 lb

## 2014-09-11 DIAGNOSIS — M25552 Pain in left hip: Secondary | ICD-10-CM

## 2014-09-11 DIAGNOSIS — S20211A Contusion of right front wall of thorax, initial encounter: Secondary | ICD-10-CM

## 2014-09-11 DIAGNOSIS — R188 Other ascites: Secondary | ICD-10-CM | POA: Diagnosis not present

## 2014-09-11 DIAGNOSIS — R932 Abnormal findings on diagnostic imaging of liver and biliary tract: Secondary | ICD-10-CM | POA: Diagnosis not present

## 2014-09-11 DIAGNOSIS — S7002XA Contusion of left hip, initial encounter: Secondary | ICD-10-CM

## 2014-09-11 DIAGNOSIS — R1084 Generalized abdominal pain: Secondary | ICD-10-CM | POA: Insufficient documentation

## 2014-09-11 LAB — POCT CBC
Granulocyte percent: 62.6 %G (ref 37–80)
HEMATOCRIT: 38.5 % (ref 37.7–47.9)
HEMOGLOBIN: 13 g/dL (ref 12.2–16.2)
Lymph, poc: 1.9 (ref 0.6–3.4)
MCH: 31.1 pg (ref 27–31.2)
MCHC: 33.7 g/dL (ref 31.8–35.4)
MCV: 92.2 fL (ref 80–97)
MID (cbc): 0.4 (ref 0–0.9)
MPV: 7.5 fL (ref 0–99.8)
POC Granulocyte: 3.8 (ref 2–6.9)
POC LYMPH PERCENT: 31 %L (ref 10–50)
POC MID %: 6.4 % (ref 0–12)
Platelet Count, POC: 271 10*3/uL (ref 142–424)
RBC: 4.18 M/uL (ref 4.04–5.48)
RDW, POC: 12.4 %
WBC: 6.1 10*3/uL (ref 4.6–10.2)

## 2014-09-11 LAB — POCT URINALYSIS DIPSTICK
Bilirubin, UA: NEGATIVE
Glucose, UA: NEGATIVE
Ketones, UA: NEGATIVE
Leukocytes, UA: NEGATIVE
NITRITE UA: NEGATIVE
PH UA: 7
PROTEIN UA: NEGATIVE
SPEC GRAV UA: 1.01
Urobilinogen, UA: 0.2

## 2014-09-11 LAB — POCT UA - MICROSCOPIC ONLY
CASTS, UR, LPF, POC: NEGATIVE
CRYSTALS, UR, HPF, POC: NEGATIVE
EPITHELIAL CELLS, URINE PER MICROSCOPY: NEGATIVE
Mucus, UA: NEGATIVE
WBC, Ur, HPF, POC: NEGATIVE
YEAST UA: NEGATIVE

## 2014-09-11 MED ORDER — METHOCARBAMOL 750 MG PO TABS: 750.0000 mg | ORAL_TABLET | Freq: Three times a day (TID) | ORAL | Status: DC

## 2014-09-11 MED ORDER — IOHEXOL 300 MG/ML  SOLN
100.0000 mL | Freq: Once | INTRAMUSCULAR | Status: AC | PRN
  Administered 2014-09-11: 100 mL via INTRAVENOUS
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
## 2014-09-11 NOTE — Patient Instructions (Addendum)
You can go for the scan today at [facility] long, arrive at 4:15 for the 4:30 [place] has authorized the scan the authorization number is [id number]. Go to the radiology department. The scan will be done with IV contrast only/ you will not need to drink contrast for the scan.

## 2014-09-11 NOTE — Progress Notes (Signed)
History and physical examinations obtained with Jaynee Eagles, PA-C. Normal ROM of cervical spine, lumbar spine, thoracic spines.  Full ROM of B shoulders, elbows, wrists, hands.  Full ROM of B knees, ankles, feet.  +TTP anterior chest wall diffusely.  Abdomen with TTP epigastric region and LUQ.  +guarding;no rebound.  No CVA TTP.  [facility] reading (PRIMARY) by  Dr. Tamala Julian.  L HIP:  NAD. A/P abdominal pain/contusion, L hip pain/contusion, chest wall pain/contusion.  Continue scheduled NSAIDs, muscle relaxer. Apply ice to hip and chest wall. Recommend rest. Avoid lifting for two weeks; avoid exercise for two weeks. If not significantly improved in two to three weeks, call office for ortho referral. To ED for development of worsening abdominal pain, vomiting, hematuria.

## 2014-09-12 LAB — COMPREHENSIVE METABOLIC PANEL
ALT: 11 U/L (ref 0–35)
AST: 15 U/L (ref 0–37)
Albumin: 4.2 g/dL (ref 3.5–5.2)
Alkaline Phosphatase: 57 U/L (ref 39–117)
BUN: 11 mg/dL (ref 6–23)
CALCIUM: 9.2 mg/dL (ref 8.4–10.5)
CHLORIDE: 105 meq/L (ref 96–112)
CO2: 26 meq/L (ref 19–32)
CREATININE: 0.66 mg/dL (ref 0.50–1.10)
GLUCOSE: 83 mg/dL (ref 70–99)
Potassium: 4.2 mEq/L (ref 3.5–5.3)
Sodium: 141 mEq/L (ref 135–145)
Total Bilirubin: 0.4 mg/dL (ref 0.2–1.2)
Total Protein: 6.5 g/dL (ref 6.0–8.3)

## 2014-09-20 ENCOUNTER — Other Ambulatory Visit: Payer: Self-pay

## 2014-09-25 ENCOUNTER — Telehealth: Payer: Self-pay

## 2014-09-25 NOTE — Telephone Encounter (Signed)
Pt wanted Dr Tamala Julian to know her UTI and BLADDER infection has come back. Would like to know what should she do now Please call [phone]

## 2014-09-26 NOTE — Telephone Encounter (Signed)
Dr. Tamala Julian,  Pt started noticing symptoms starting Sunday. Pt has mild increased frequency, burning, and strong odor especially in the morning. She would rather not come in for an office visit if she doesn't have to. I told her you were working tonight if she would like to come in and see you because she only wants to see you.

## 2014-09-27 MED ORDER — SULFAMETHOXAZOLE-TRIMETHOPRIM 800-160 MG PO TABS: 1.0000 | ORAL_TABLET | Freq: Two times a day (BID) | ORAL | Status: DC

## 2014-09-27 NOTE — Telephone Encounter (Signed)
Call --- 1. I have sent an antibiotic to her pharmacy only because she has recently suffered with these similar symptoms.  If no improvement in symptoms by Monday, she needs office visit.

## 2014-09-27 NOTE — Telephone Encounter (Signed)
Left detailed VM informing that antibiotic has been sent to pharmacy and to RTC if no improvement with symptoms by Monday.

## 2014-09-30 ENCOUNTER — Other Ambulatory Visit: Payer: Self-pay | Admitting: *Deleted

## 2014-09-30 MED ORDER — METHOCARBAMOL 750 MG PO TABS: 750.0000 mg | ORAL_TABLET | Freq: Three times a day (TID) | ORAL | Status: DC

## 2014-09-30 NOTE — Telephone Encounter (Signed)
Pt called in- she states she had not received the printed Robaxin Rx- pt requested rx escribed to [facility].

## 2014-12-26 ENCOUNTER — Ambulatory Visit (INDEPENDENT_AMBULATORY_CARE_PROVIDER_SITE_OTHER): Payer: BC Managed Care – PPO

## 2014-12-26 ENCOUNTER — Ambulatory Visit (INDEPENDENT_AMBULATORY_CARE_PROVIDER_SITE_OTHER): Payer: BC Managed Care – PPO | Admitting: Family Medicine

## 2014-12-26 VITALS — BP 104/62 | HR 58 | Temp 98.6°F | Resp 16 | Ht 64.0 in | Wt 151.6 lb

## 2014-12-26 DIAGNOSIS — M542 Cervicalgia: Secondary | ICD-10-CM

## 2014-12-26 DIAGNOSIS — M25512 Pain in left shoulder: Secondary | ICD-10-CM

## 2014-12-26 DIAGNOSIS — S46912A Strain of unspecified muscle, fascia and tendon at shoulder and upper arm level, left arm, initial encounter: Secondary | ICD-10-CM

## 2014-12-26 DIAGNOSIS — Z23 Encounter for immunization: Secondary | ICD-10-CM

## 2014-12-26 DIAGNOSIS — S161XXA Strain of muscle, fascia and tendon at neck level, initial encounter: Secondary | ICD-10-CM

## 2014-12-26 MED ORDER — HYDROCODONE-ACETAMINOPHEN 5-325 MG PO TABS: 1.0000 | ORAL_TABLET | Freq: Every evening | ORAL | Status: DC | PRN

## 2014-12-26 MED ORDER — TRAMADOL HCL 50 MG PO TABS
50.0000 mg | ORAL_TABLET | Freq: Three times a day (TID) | ORAL | Status: DC | PRN
Start: 2014-12-26 — End: 2015-03-13

## 2014-12-26 MED ORDER — METHOCARBAMOL 750 MG PO TABS: 750.0000 mg | ORAL_TABLET | Freq: Four times a day (QID) | ORAL | Status: DC | PRN

## 2014-12-26 MED ORDER — PREDNISONE 20 MG PO TABS
ORAL_TABLET | ORAL | Status: DC
Start: 2014-12-26 — End: 2015-03-13

## 2014-12-26 NOTE — Patient Instructions (Signed)
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
## 2015-01-08 ENCOUNTER — Encounter: Payer: Self-pay | Admitting: Family Medicine

## 2015-01-08 ENCOUNTER — Ambulatory Visit (INDEPENDENT_AMBULATORY_CARE_PROVIDER_SITE_OTHER): Payer: BC Managed Care – PPO | Admitting: Family Medicine

## 2015-01-08 VITALS — BP 120/94 | HR 82 | Temp 98.4°F | Resp 16 | Ht 64.75 in | Wt 150.6 lb

## 2015-01-08 DIAGNOSIS — Z131 Encounter for screening for diabetes mellitus: Secondary | ICD-10-CM

## 2015-01-08 DIAGNOSIS — Z1329 Encounter for screening for other suspected endocrine disorder: Secondary | ICD-10-CM

## 2015-01-08 DIAGNOSIS — S46912D Strain of unspecified muscle, fascia and tendon at shoulder and upper arm level, left arm, subsequent encounter: Secondary | ICD-10-CM

## 2015-01-08 DIAGNOSIS — F411 Generalized anxiety disorder: Secondary | ICD-10-CM

## 2015-01-08 DIAGNOSIS — Z13220 Encounter for screening for lipoid disorders: Secondary | ICD-10-CM

## 2015-01-08 DIAGNOSIS — Z0000 Encounter for general adult medical examination without abnormal findings: Secondary | ICD-10-CM

## 2015-01-08 LAB — CBC WITH DIFFERENTIAL/PLATELET
BASOS PCT: 1 % (ref 0–1)
Basophils Absolute: 0.1 10*3/uL (ref 0.0–0.1)
Eosinophils Absolute: 0.1 10*3/uL (ref 0.0–0.7)
Eosinophils Relative: 2 % (ref 0–5)
HEMATOCRIT: 39.7 % (ref 36.0–46.0)
Hemoglobin: 13.7 g/dL (ref 12.0–15.0)
Lymphocytes Relative: 33 % (ref 12–46)
Lymphs Abs: 1.7 10*3/uL (ref 0.7–4.0)
MCH: 30.5 pg (ref 26.0–34.0)
MCHC: 34.5 g/dL (ref 30.0–36.0)
MCV: 88.4 fL (ref 78.0–100.0)
MPV: 9.6 fL (ref 8.6–12.4)
Monocytes Absolute: 0.4 10*3/uL (ref 0.1–1.0)
Monocytes Relative: 7 % (ref 3–12)
NEUTROS PCT: 57 % (ref 43–77)
Neutro Abs: 3 10*3/uL (ref 1.7–7.7)
PLATELETS: 272 10*3/uL (ref 150–400)
RBC: 4.49 MIL/uL (ref 3.87–5.11)
RDW: 13.2 % (ref 11.5–15.5)
WBC: 5.2 10*3/uL (ref 4.0–10.5)

## 2015-01-08 LAB — POCT URINALYSIS DIPSTICK
BILIRUBIN UA: NEGATIVE
GLUCOSE UA: NEGATIVE
KETONES UA: NEGATIVE
LEUKOCYTES UA: NEGATIVE
NITRITE UA: NEGATIVE
Protein, UA: NEGATIVE
Spec Grav, UA: <=1.005
Urobilinogen, UA: 0.2
pH, UA: 6

## 2015-01-08 LAB — COMPREHENSIVE METABOLIC PANEL WITH GFR
ALT: 13 U/L (ref 0–35)
AST: 16 U/L (ref 0–37)
Albumin: 4.4 g/dL (ref 3.5–5.2)
Alkaline Phosphatase: 57 U/L (ref 39–117)
BUN: 13 mg/dL (ref 6–23)
CO2: 26 meq/L (ref 19–32)
Calcium: 9.7 mg/dL (ref 8.4–10.5)
Chloride: 104 meq/L (ref 96–112)
Creat: 0.82 mg/dL (ref 0.50–1.10)
Glucose, Bld: 88 mg/dL (ref 70–99)
Potassium: 4.2 meq/L (ref 3.5–5.3)
Sodium: 140 meq/L (ref 135–145)
Total Bilirubin: 0.7 mg/dL (ref 0.2–1.2)
Total Protein: 6.9 g/dL (ref 6.0–8.3)

## 2015-01-08 LAB — HEMOGLOBIN A1C
Hgb A1c MFr Bld: 5.2 % (ref <5.7)
Mean Plasma Glucose: 103 mg/dL (ref <117)

## 2015-01-08 LAB — LIPID PANEL
Cholesterol: 140 mg/dL (ref 0–200)
HDL: 62 mg/dL (ref >39)
LDL Cholesterol: 65 mg/dL (ref 0–99)
TRIGLYCERIDES: 65 mg/dL (ref <150)
Total CHOL/HDL Ratio: 2.3 Ratio
VLDL: 13 mg/dL (ref 0–40)

## 2015-01-08 LAB — TSH: TSH: 0.818 u[IU]/mL (ref 0.350–4.500)

## 2015-01-08 MED ORDER — CLONAZEPAM 0.5 MG PO TABS: 0.5000 mg | ORAL_TABLET | Freq: Every day | ORAL | Status: DC | PRN

## 2015-01-08 NOTE — Patient Instructions (Signed)

## 2015-01-08 NOTE — Progress Notes (Signed)
Subjective:    Patient ID: Angela Morgan, female    DOB: 03/23/1971, 44 y.o.   MRN: [id number]  01/08/2015  Annual Exam   HPI This 44 y.o. female presents for Complete Physical Examination.  Last physical:  01/07/2014 Pap smear: per gynecology  07/2014; [name]; WNL; prescribed HRT. Mammogram:  07/08/2014, 01/09/2014 [facility]; due for repeat diagnostic mammogram.  To call today for appointment.   Colonoscopy: 2003 TDAP:  2012 Influenza: 12/26/2014 Eye exam:  Due in June 2016; goes yearly; +glasses and contacts; no glaucoma or cataracts. Dental exam:  11/2014.    L Shoulder: still having pain.  Took half of Prednisone taper.  Now taking Ibuprofen, Robaxin sparingly, Tramadol.  Has used hydrocodone three times at night.  Not quite ready to go to ortho.  Anxiety: getting married next week; son is senior in high school; daughter is suffering with horrible anxiety at school/college; daughter calls patient five times per day and patient has very difficult time refocusing after conversations with daughter.  Review of Systems  Constitutional: Negative.  Negative for fever, chills, diaphoresis, activity change, appetite change, fatigue and unexpected weight change.  HENT: Negative.  Negative for congestion, dental problem, drooling, ear discharge, ear pain, facial swelling, hearing loss, mouth sores, nosebleeds, postnasal drip, rhinorrhea, sinus pressure, sneezing, sore throat, tinnitus, trouble swallowing and voice change.   Eyes: Negative.  Negative for photophobia, pain, discharge, redness, itching and visual disturbance.  Respiratory: Negative.  Negative for apnea, cough, choking, chest tightness, shortness of breath, wheezing and stridor.   Cardiovascular: Negative.  Negative for chest pain, palpitations and leg swelling.  Gastrointestinal: Negative.  Negative for nausea, vomiting, abdominal pain, diarrhea, constipation, blood in stool, abdominal distention, anal bleeding and  rectal pain.  Endocrine: Negative.  Negative for cold intolerance, heat intolerance, polydipsia, polyphagia and polyuria.  Genitourinary: Negative.  Negative for dysuria, urgency, frequency, hematuria, flank pain, decreased urine volume, vaginal bleeding, vaginal discharge, enuresis, difficulty urinating, genital sores, vaginal pain, menstrual problem, pelvic pain and dyspareunia.  Musculoskeletal: Positive for arthralgias and neck stiffness. Negative for myalgias, back pain, joint swelling, gait problem and neck pain.  Skin: Negative.  Negative for color change, pallor, rash and wound.  Allergic/Immunologic: Negative.  Negative for environmental allergies, food allergies and immunocompromised state.  Neurological: Positive for dizziness. Negative for tremors, seizures, syncope, facial asymmetry, speech difficulty, weakness, light-headedness, numbness and headaches.  Hematological: Negative.  Negative for adenopathy. Does not bruise/bleed easily.  Psychiatric/Behavioral: Negative for suicidal ideas, hallucinations, behavioral problems, confusion, sleep disturbance, self-injury, dysphoric mood, decreased concentration and agitation. The patient is nervous/anxious. The patient is not hyperactive.     Past Medical History  Diagnosis Date  . Anxiety   . Depression   . GERD (gastroesophageal reflux disease)   . Migraine, unspecified, without mention of intractable migraine without mention of status migrainosus   . Arthropathy, unspecified, site unspecified   . Allergic rhinitis, cause unspecified   . Situational, disturbance, acute   . Vestibular dysfunction   . Vestibular neuritis   . Insomnia, unspecified   . Symptomatic states associated with artificial menopause   . Urticaria, unspecified   . Depressive disorder, not elsewhere classified   . Adjustment disorder with depressed mood   . Fibromyalgia   . Postablative ovarian failure   . Irritable bowel syndrome   . Cellulitis 11/2007     Orbital Shingles   Hospitalized  . Allergy    Past Surgical History  Procedure Laterality Date  . Total  abdominal hysterectomy      ovaries removed  . Knee surgery      right  . Temporomandibular joint surgery    . Appendectomy    . Cesarean section      x 2  . Cholecystectomy     Allergies  Allergen Reactions  . Biaxin [Clarithromycin] Anaphylaxis   Current Outpatient Prescriptions  Medication Sig Dispense Refill  . estradiol (ESTRACE) 1 MG tablet Take 1 mg by mouth daily.    Marland Kitchen HYDROcodone-acetaminophen (NORCO/VICODIN) 5-325 MG per tablet Take 1 tablet by mouth at bedtime as needed for moderate pain. 30 tablet 0  . ibuprofen (ADVIL,MOTRIN) 600 MG tablet Take 1 tablet (600 mg total) by mouth every 6 (six) hours as needed. 30 tablet 0  . methocarbamol (ROBAXIN) 750 MG tablet Take 1 tablet (750 mg total) by mouth every 6 (six) hours as needed for muscle spasms. 40 tablet 0  . Multiple Vitamin (MULTIVITAMIN) tablet Take 1 tablet by mouth daily.    . NON FORMULARY 1 suppository by Other route every other day. Estrogen/testosterone suppository/ pill every other day.    . Progesterone Micronized (PROGESTERONE PO) Take 75 mg by mouth daily.    . traMADol (ULTRAM) 50 MG tablet Take 1 tablet (50 mg total) by mouth every 8 (eight) hours as needed. 30 tablet 0  . VITAMIN D, CHOLECALCIFEROL, PO Take by mouth daily.    . clonazePAM (KLONOPIN) 0.5 MG tablet Take 1 tablet (0.5 mg total) by mouth daily as needed for anxiety. 30 tablet 1  . predniSONE (DELTASONE) 20 MG tablet Three tablets daily x 2 days then two tablets daily x 5 days then one tablet daily x 3 days (Patient not taking: Reported on 01/08/2015) 19 tablet 0   No current facility-administered medications for this visit.       Objective:    BP 120/94 mmHg  Pulse 82  Temp(Src) 98.4 F (36.9 C) (Oral)  Resp 16  Ht 5' 4.75" (1.645 m)  Wt 150 lb 9.6 oz (68.312 kg)  BMI 25.24 kg/m2  SpO2 98% Physical Exam  Constitutional: She is  oriented to person, place, and time. She appears well-developed and well-nourished. No distress.  HENT:  Head: Normocephalic and atraumatic.  Right Ear: External ear normal.  Left Ear: External ear normal.  Nose: Nose normal.  Mouth/Throat: Oropharynx is clear and moist.  Eyes: Conjunctivae and EOM are normal. Pupils are equal, round, and reactive to light.  Neck: Normal range of motion and full passive range of motion without pain. Neck supple. No JVD present. Carotid bruit is not present. No thyromegaly present.  Cardiovascular: Normal rate, regular rhythm, normal heart sounds and intact distal pulses.  Exam reveals no gallop and no friction rub.   No murmur heard. Pulmonary/Chest: Effort normal and breath sounds normal. She has no wheezes. She has no rales. Right breast exhibits no inverted nipple, no mass, no nipple discharge, no skin change and no tenderness. Left breast exhibits no inverted nipple, no mass, no nipple discharge, no skin change and no tenderness. Breasts are symmetrical.  Abdominal: Soft. Bowel sounds are normal. She exhibits no distension and no mass. There is no tenderness. There is no rebound and no guarding.  Musculoskeletal:       Right shoulder: Normal.       Left shoulder: She exhibits decreased range of motion, tenderness and pain.       Cervical back: Normal.  Lymphadenopathy:    She has no cervical adenopathy.  Neurological: She is alert and oriented to person, place, and time. She has normal reflexes. No cranial nerve deficit. She exhibits normal muscle tone. Coordination normal.  Skin: Skin is warm and dry. No rash noted. She is not diaphoretic. No erythema. No pallor.  Psychiatric: She has a normal mood and affect. Her behavior is normal. Judgment and thought content normal.  Nursing note and vitals reviewed.       Assessment & Plan:   1. Anxiety state   2. Physical exam, routine   3. Screening for diabetes mellitus   4. Screening, lipid   5.  Screening for thyroid disorder   6. Left shoulder strain, subsequent encounter      1. Complete Physical Examination:  Anticipatory guidance --- weight maintenance, exercise.  Pap smear UTD; due for six month repeat mammogram; pt to call to schedule today. Immunizations UTD. 2.  Screening DM: obtain glucose, HgbA1c. 3.  Screening lipid: obtain FLP. 4.  Screening thyroid: obtain thyroid levels. 5.  Anxiety/stress reaction:  New.  Rx for Klonopin for PRN use.  Follow-up in three to six months for reevaluation. 6. L shoulder strain: persistent; recommend ortho consultation if no improvement in upcoming three weeks.   Meds ordered this encounter  Medications  . Multiple Vitamin (MULTIVITAMIN) tablet    Sig: Take 1 tablet by mouth daily.  Marland Kitchen VITAMIN D, CHOLECALCIFEROL, PO    Sig: Take by mouth daily.  . clonazePAM (KLONOPIN) 0.5 MG tablet    Sig: Take 1 tablet (0.5 mg total) by mouth daily as needed for anxiety.    Dispense:  30 tablet    Refill:  1    No Follow-up on file.    Marshall Kampf Elayne Guerin, M.D. [facility] [address] [place], [place]  [address] [phone] phone [phone] fax

## 2015-01-24 ENCOUNTER — Telehealth: Payer: Self-pay

## 2015-01-24 MED ORDER — NITROFURANTOIN MONOHYD MACRO 100 MG PO CAPS: 100.0000 mg | ORAL_CAPSULE | Freq: Two times a day (BID) | ORAL | Status: DC

## 2015-01-24 NOTE — Telephone Encounter (Signed)
Spoke with pt, she is having burning while urinating and cloudy urine. She states you will call in an ABX because she gets these so frequently. She is retaining fluid in her fingers but thinks its because of her diet being high in sodium and she is trying to drink more fluids. Please advise.

## 2015-01-24 NOTE — Telephone Encounter (Signed)
Dr. Tamala Julian   Retaining fluid, UTI symptoms again.    [phone]  Pharmacy - [facility] on [address]

## 2015-01-24 NOTE — Telephone Encounter (Signed)
Macrobid sent to [facility] on [address]; if no improvement in symptoms in 48-72 hours, needs OV.  I work Sunday.  If swelling does not improve, needs OV.

## 2015-01-25 NOTE — Telephone Encounter (Signed)
Called pt, left a detailed message on her machine. Rx sent in and to [facility] if no improvement.

## 2015-01-29 ENCOUNTER — Telehealth: Payer: Self-pay

## 2015-01-29 NOTE — Telephone Encounter (Signed)
Patient has been taking vicodin to sleep due to the weather. She is requesting a refill

## 2015-01-30 NOTE — Telephone Encounter (Signed)
Called to get more information about this request. Unable to leave VM. Dr. Tamala Julian you note did say to take at night for sleep. Please advise on refill.

## 2015-01-31 ENCOUNTER — Other Ambulatory Visit: Payer: Self-pay | Admitting: Physician Assistant

## 2015-01-31 MED ORDER — HYDROCODONE-ACETAMINOPHEN 5-325 MG PO TABS: 1.0000 | ORAL_TABLET | Freq: Every evening | ORAL | Status: DC | PRN

## 2015-01-31 NOTE — Telephone Encounter (Signed)
OK to refill hydrocodone.  Will send to [facility] since I am out of town.

## 2015-01-31 NOTE — Progress Notes (Signed)
Refilling pain medication per Dr. Thompson Caul request.  Philis Fendt, MS, PA-C   3:30 PM, 01/31/2015

## 2015-02-01 NOTE — Telephone Encounter (Signed)
This was done.

## 2015-03-11 ENCOUNTER — Telehealth: Payer: Self-pay

## 2015-03-11 NOTE — Telephone Encounter (Signed)
Spoke with pt--she is having dysuria, cloudy and odorous urine. She wants to know if you will call her something in.

## 2015-03-11 NOTE — Telephone Encounter (Signed)
Patient wants Dr Tamala Julian to know she is having UTI symptoms again

## 2015-03-13 ENCOUNTER — Ambulatory Visit (INDEPENDENT_AMBULATORY_CARE_PROVIDER_SITE_OTHER): Payer: BC Managed Care – PPO | Admitting: Family Medicine

## 2015-03-13 VITALS — BP 118/76 | HR 60 | Temp 97.7°F | Resp 16 | Ht 64.0 in | Wt 154.0 lb

## 2015-03-13 DIAGNOSIS — F4323 Adjustment disorder with mixed anxiety and depressed mood: Secondary | ICD-10-CM | POA: Diagnosis not present

## 2015-03-13 DIAGNOSIS — R300 Dysuria: Secondary | ICD-10-CM

## 2015-03-13 LAB — POCT UA - MICROSCOPIC ONLY
CRYSTALS, UR, HPF, POC: NEGATIVE
Casts, Ur, LPF, POC: NEGATIVE
MUCUS UA: NEGATIVE
Yeast, UA: NEGATIVE

## 2015-03-13 LAB — POCT URINALYSIS DIPSTICK
BILIRUBIN UA: NEGATIVE
Glucose, UA: NEGATIVE
Ketones, UA: NEGATIVE
NITRITE UA: NEGATIVE
Protein, UA: NEGATIVE
Spec Grav, UA: 1.015
UROBILINOGEN UA: 0.2
pH, UA: 8

## 2015-03-13 MED ORDER — CLONAZEPAM 0.5 MG PO TABS: 0.5000 mg | ORAL_TABLET | Freq: Every day | ORAL | Status: DC | PRN

## 2015-03-13 MED ORDER — CIPROFLOXACIN HCL 500 MG PO TABS: 500.0000 mg | ORAL_TABLET | Freq: Two times a day (BID) | ORAL | Status: DC

## 2015-03-13 NOTE — Patient Instructions (Signed)

## 2015-03-13 NOTE — Telephone Encounter (Signed)
Pt.notified

## 2015-03-13 NOTE — Progress Notes (Signed)
Subjective:    Patient ID: Angela Morgan, female    DOB: 05/01/1971, 44 y.o.   MRN: [id number] This chart was scribed for [name], MD by Zola Button, Medical Scribe. This patient was seen in Room 4 and the patient's care was started at 8:18 PM.   03/13/2015  Dysuria   HPI  HPI Comments: Angela Morgan is a 44 y.o. female with a hx of 2 prior UTIs and depression with anxiety who presents to the [facility] complaining of dysuria that started 3 days ago. Patient believes she may have a UTI. She also reports having pressurizing pelvic pain, cloudy urine and chills. She started taking AZO last night, but she states it made her drowsy. Patient recently had her wedding and honeymoon in [place] last week. She ate and slept well last week, but she started having abdominal pain, loss of appetite, and nausea when she returned 4 days ago. Patient notes constipation last week, but she attributes this to lack of vegetables. She had the following day off (Monday), but she stayed in bed the whole day because she was not feeling well. Patient notes her husband had the stomach bug a few nights ago. She denies fever, diaphoresis, diarrhea, back pain, hematuria, vaginal discharge and vaginal irritation. She had been diagnosed with interstitial cystitis in 2003, but her symptoms went away after she had a hysterectomy, so the doctor who performed her hysterectomy thought that she did not really have interstitial cystitis.  Endometriosis was so severe in 2003 that a ureter stent had to be placed by Dr. Jacqlyn Larsen during surgical resection of endometriosis.  She states she has been doing well with her anxiety recently. She wants a refill of anxiety medication to use prn because she anticipates increased stress over the summer.  Anxiety has been stable recently; doing really well emotionally.  Got married last week; wedding was wonderful; honeymoon in [place] was really enjoyable.    Review of Systems  Constitutional: Positive for chills, appetite change and fatigue. Negative for fever and diaphoresis.  Gastrointestinal: Positive for nausea, abdominal pain and constipation. Negative for diarrhea.  Genitourinary: Positive for dysuria, urgency, frequency and pelvic pain. Negative for hematuria, flank pain, vaginal discharge, genital sores and vaginal pain.  Musculoskeletal: Negative for back pain.  Skin: Negative for rash.  Neurological: Negative for headaches.    Past Medical History  Diagnosis Date  . Anxiety   . Depression   . GERD (gastroesophageal reflux disease)   . Migraine, unspecified, without mention of intractable migraine without mention of status migrainosus   . Arthropathy, unspecified, site unspecified   . Allergic rhinitis, cause unspecified   . Situational, disturbance, acute   . Vestibular dysfunction   . Vestibular neuritis   . Insomnia, unspecified   . Symptomatic states associated with artificial menopause   . Urticaria, unspecified   . Depressive disorder, not elsewhere classified   . Adjustment disorder with depressed mood   . Fibromyalgia   . Postablative ovarian failure   . Irritable bowel syndrome   . Cellulitis 11/2007    Orbital Shingles   Hospitalized  . Allergy    Past Surgical History  Procedure Laterality Date  . Total abdominal hysterectomy      ovaries removed  . Knee surgery      right  . Temporomandibular joint surgery    . Appendectomy    . Cesarean section      x 2  . Cholecystectomy  Allergies  Allergen Reactions  . Biaxin [Clarithromycin] Anaphylaxis   Current Outpatient Prescriptions  Medication Sig Dispense Refill  . clonazePAM (KLONOPIN) 0.5 MG tablet Take 1 tablet (0.5 mg total) by mouth daily as needed for anxiety. 30 tablet 1  . estradiol (ESTRACE) 1 MG tablet Take 1 mg by mouth daily.    Marland Kitchen ibuprofen (ADVIL,MOTRIN) 600 MG tablet Take 1 tablet (600 mg total) by mouth every 6 (six) hours as  needed. 30 tablet 0  . Multiple Vitamin (MULTIVITAMIN) tablet Take 1 tablet by mouth daily.    . NON FORMULARY 1 suppository by Other route every other day. Estrogen/testosterone suppository/ pill every other day.    . Progesterone Micronized (PROGESTERONE PO) Take 75 mg by mouth daily.    Marland Kitchen VITAMIN D, CHOLECALCIFEROL, PO Take by mouth daily.    . ciprofloxacin (CIPRO) 500 MG tablet Take 1 tablet (500 mg total) by mouth 2 (two) times daily. 14 tablet 0   No current facility-administered medications for this visit.       Objective:    BP 118/76 mmHg  Pulse 60  Temp(Src) 97.7 F (36.5 C) (Oral)  Resp 16  Ht 5\' 4"  (1.626 m)  Wt 154 lb (69.854 kg)  BMI 26.42 kg/m2  SpO2 99% Physical Exam  Constitutional: She is oriented to person, place, and time. She appears well-developed and well-nourished. No distress.  HENT:  Head: Normocephalic and atraumatic.  Mouth/Throat: Oropharynx is clear and moist. No oropharyngeal exudate.  Eyes: Pupils are equal, round, and reactive to light.  Neck: Neck supple.  Cardiovascular: Normal rate, regular rhythm and normal heart sounds.   No murmur heard. Pulmonary/Chest: Effort normal and breath sounds normal. No respiratory distress. She has no wheezes. She has no rales.  Abdominal: Soft. Bowel sounds are normal. She exhibits no distension and no mass. There is tenderness in the suprapubic area. There is CVA tenderness. There is no rebound and no guarding.  Right CVA tenderness.  Musculoskeletal: She exhibits no edema.  Neurological: She is alert and oriented to person, place, and time. No cranial nerve deficit.  Skin: Skin is warm and dry. No rash noted.  Psychiatric: She has a normal mood and affect. Her behavior is normal.  Vitals reviewed.  Results for orders placed or performed in visit on 03/13/15  POCT urinalysis dipstick  Result Value Ref Range   Color, UA yellow    Clarity, UA cloudy    Glucose, UA neg    Bilirubin, UA neg    Ketones, UA  neg    Spec Grav, UA 1.015    Blood, UA moderate    pH, UA 8.0    Protein, UA neg    Urobilinogen, UA 0.2    Nitrite, UA neg    Leukocytes, UA large (3+)   POCT UA - Microscopic Only  Result Value Ref Range   WBC, Ur, HPF, POC TNTC    RBC, urine, microscopic 3-6    Bacteria, U Microscopic 1+    Mucus, UA neg    Epithelial cells, urine per micros 2-4    Crystals, Ur, HPF, POC neg    Casts, Ur, LPF, POC neg    Yeast, UA neg        Assessment & Plan:   1. Dysuria   2. Adjustment disorder with mixed anxiety and depressed mood     1. Dysuria: New Send urine culture. Third UTI in past nine months.  If has another recurrent UTI, will refer to urology/Cope.  2. Anxiety: stable; intermittent; upcoming work stressors and son graduating from high school; refill of Clonazepam provided.   Meds ordered this encounter  Medications  . ciprofloxacin (CIPRO) 500 MG tablet    Sig: Take 1 tablet (500 mg total) by mouth 2 (two) times daily.    Dispense:  14 tablet    Refill:  0  . clonazePAM (KLONOPIN) 0.5 MG tablet    Sig: Take 1 tablet (0.5 mg total) by mouth daily as needed for anxiety.    Dispense:  30 tablet    Refill:  1    No Follow-up on file.   I personally performed the services described in this documentation, which was scribed in my presence. The recorded information has been reviewed and considered.  Kristi Elayne Guerin, M.D. [facility] [address] [place], [place]  [address] [phone] phone [phone] fax

## 2015-03-13 NOTE — Telephone Encounter (Signed)
Needs OV.  

## 2015-03-18 LAB — URINE CULTURE: Colony Count: >=100000

## 2015-03-24 ENCOUNTER — Ambulatory Visit (INDEPENDENT_AMBULATORY_CARE_PROVIDER_SITE_OTHER): Payer: BC Managed Care – PPO | Admitting: Emergency Medicine

## 2015-03-24 VITALS — BP 130/80 | HR 91 | Temp 101.2°F | Resp 18 | Ht 64.5 in | Wt 150.0 lb

## 2015-03-24 DIAGNOSIS — N3090 Cystitis, unspecified without hematuria: Secondary | ICD-10-CM

## 2015-03-24 DIAGNOSIS — M545 Low back pain: Secondary | ICD-10-CM

## 2015-03-24 DIAGNOSIS — R509 Fever, unspecified: Secondary | ICD-10-CM

## 2015-03-24 LAB — POCT URINALYSIS DIPSTICK
GLUCOSE UA: NEGATIVE
Leukocytes, UA: NEGATIVE
NITRITE UA: NEGATIVE
Protein, UA: 30
Spec Grav, UA: >=1.03
Urobilinogen, UA: 0.2
pH, UA: 5.5

## 2015-03-24 LAB — POCT UA - MICROSCOPIC ONLY
Casts, Ur, LPF, POC: NEGATIVE
Crystals, Ur, HPF, POC: NEGATIVE
Mucus, UA: POSITIVE
WBC, UR, HPF, POC: NEGATIVE
YEAST UA: NEGATIVE

## 2015-03-24 MED ORDER — PHENAZOPYRIDINE HCL 200 MG PO TABS: 200.0000 mg | ORAL_TABLET | Freq: Three times a day (TID) | ORAL | Status: DC | PRN

## 2015-03-24 MED ORDER — SULFAMETHOXAZOLE-TRIMETHOPRIM 800-160 MG PO TABS: 1.0000 | ORAL_TABLET | Freq: Two times a day (BID) | ORAL | Status: DC

## 2015-03-24 MED ORDER — IBUPROFEN 200 MG PO TABS
600.0000 mg | ORAL_TABLET | Freq: Once | ORAL | Status: AC
Start: 2015-03-24 — End: 2015-03-24
  Administered 2015-03-24: 600 mg via ORAL

## 2015-03-24 NOTE — Patient Instructions (Signed)

## 2015-03-24 NOTE — Progress Notes (Signed)
[facility] [address] [place] [address] [phone]  Date:  03/24/2015   Name:  Angela Morgan   DOB:  Jan 29, 1971   MRN:  [id number]  PCP:  [name], MD    Chief Complaint: Fever; Chills; and Back Pain   History of Present Illness:  Angela Morgan is a 44 y.o. very pleasant female patient who presents with the following:  Ill since yesterday had a sudden onset malaise and fever Denies dysuria, urgency or frequency.  Surgical menopause Has hematuria and back pain across lower back. Has clear rhinorrhea No sore throat No nausea or vomiting.   Minor cough not productive. Works with children. Recurrent UTI's last seen April for cystitis No improvement with over the counter medications or other home remedies.  Denies other complaint or health concern today.   Patient Active Problem List   Diagnosis Date Noted  . Pilonidal cyst 03/28/2013  . Migraine, unspecified, without mention of intractable migraine without mention of status migrainosus 03/15/2013  . Neck strain 03/15/2013  . Dizziness and giddiness 03/15/2013  . Contact dermatitis 03/15/2013  . Premature menopause on HRT 01/09/2013  . 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 Depression with anxiety 08/04/2012  . Insomnia 08/04/2012    Past Medical History  Diagnosis Date  . Anxiety   . Depression   . GERD (gastroesophageal reflux disease)   . Migraine, unspecified, without mention of intractable migraine without mention of status migrainosus   . Arthropathy, unspecified, site unspecified   . Allergic rhinitis, cause unspecified   . Situational, disturbance, acute   . Vestibular dysfunction   . Vestibular neuritis   . Insomnia, unspecified   . Symptomatic states associated with artificial menopause   . Urticaria, unspecified   . Depressive disorder, not elsewhere classified   . Adjustment disorder with depressed mood   . Fibromyalgia   . Postablative ovarian failure    . Irritable bowel syndrome   . Cellulitis 11/2007    Orbital Shingles   Hospitalized  . Allergy     Past Surgical History  Procedure Laterality Date  . Total abdominal hysterectomy      ovaries removed  . Knee surgery      right  . Temporomandibular joint surgery    . Appendectomy    . Cesarean section      x 2  . Cholecystectomy      History  Substance Use Topics  . Smoking status: Never Smoker   . Smokeless tobacco: Never Used  . Alcohol Use: 2.4 oz/week    4 Glasses of wine per week    Family History  Problem Relation Age of Onset  . Diabetes Maternal Grandfather   . Hypertension Mother   . Stroke Mother   . COPD Mother   . Aneurysm Mother     brain  . Transient ischemic attack Mother     multiple  . Hyperlipidemia Mother   . Hypertension Father   . Heart disease Father   . Mental illness Father   . Depression Father   . Hyperlipidemia Father   . Hyperlipidemia Brother   . Hypertension Brother   . Hypertension Brother     Allergies  Allergen Reactions  . Biaxin [Clarithromycin] Anaphylaxis    Medication list has been reviewed and updated.  Current Outpatient Prescriptions on File Prior to Visit  Medication Sig Dispense Refill  . clonazePAM (KLONOPIN) 0.5 MG tablet Take 1 tablet (0.5 mg total) by mouth daily as needed  for anxiety. 30 tablet 1  . estradiol (ESTRACE) 1 MG tablet Take 1 mg by mouth daily.    Marland Kitchen ibuprofen (ADVIL,MOTRIN) 600 MG tablet Take 1 tablet (600 mg total) by mouth every 6 (six) hours as needed. 30 tablet 0  . Multiple Vitamin (MULTIVITAMIN) tablet Take 1 tablet by mouth daily.    . NON FORMULARY 1 suppository by Other route every other day. Estrogen/testosterone suppository/ pill every other day.    . Progesterone Micronized (PROGESTERONE PO) Take 75 mg by mouth daily.    Marland Kitchen VITAMIN D, CHOLECALCIFEROL, PO Take by mouth daily.    . ciprofloxacin (CIPRO) 500 MG tablet Take 1 tablet (500 mg total) by mouth 2 (two) times daily.  (Patient not taking: Reported on 03/24/2015) 14 tablet 0   No current facility-administered medications on file prior to visit.    Review of Systems:  As per HPI, otherwise negative.    Physical Examination: Filed Vitals:   03/24/15 1530  BP: 130/80  Pulse: 91  Temp: 101.2 F (38.4 C)  Resp: 18   Filed Vitals:   03/24/15 1530  Height: 5' 4.5" (1.638 m)  Weight: 150 lb (68.04 kg)   Body mass index is 25.36 kg/(m^2). Ideal Body Weight: Weight in (lb) to have BMI = 25: 147.6  GEN: WDWN, NAD, Non-toxic, A & O x 3 HEENT: Atraumatic, Normocephalic. Neck supple. No masses, No LAD. Ears and Nose: No external deformity. CV: RRR, No M/G/R. No JVD. No thrill. No extra heart sounds. PULM: CTA B, no wheezes, crackles, rhonchi. No retractions. No resp. distress. No accessory muscle use. ABD: S, NT, ND, +BS. No rebound. No HSM. EXTR: No c/c/e NEURO Normal gait.  PSYCH: Normally interactive. Conversant. Not depressed or anxious appearing.  Calm demeanor.    Assessment and Plan: Acute cystitis Refer urology Septra Pyridium  Signed,  Ellison Carwin, MD   Results for orders placed or performed in visit on 03/24/15  POCT urinalysis dipstick  Result Value Ref Range   Color, UA yellow    Clarity, UA slightly cloudy    Glucose, UA neg    Bilirubin, UA small    Ketones, UA trace    Spec Grav, UA >=1.030    Blood, UA moderate    pH, UA 5.5    Protein, UA 30    Urobilinogen, UA 0.2    Nitrite, UA neg    Leukocytes, UA Negative   POCT UA - Microscopic Only  Result Value Ref Range   WBC, Ur, HPF, POC neg    RBC, urine, microscopic 10-15    Bacteria, U Microscopic 2+    Mucus, UA positive    Epithelial cells, urine per micros 3-5    Crystals, Ur, HPF, POC neg    Casts, Ur, LPF, POC neg    Yeast, UA neg

## 2015-03-26 ENCOUNTER — Telehealth: Payer: Self-pay

## 2015-03-26 LAB — URINE CULTURE: Colony Count: >=100000

## 2015-03-26 NOTE — Telephone Encounter (Signed)
Patient returned phone call. Please call back! [phone]

## 2015-03-26 NOTE — Telephone Encounter (Signed)
Assessment & Plan:   1. Dysuria   2. Adjustment disorder with mixed anxiety and depressed mood     1. Dysuria: New Send urine culture. Third UTI in past nine months. If has another recurrent UTI, will refer to urology/Cope. 2. Anxiety: stable; intermittent; upcoming work stressors and son graduating from high school; refill of Clonazepam provided.         Referral was placed on 03/24/2015. She is supposed to go back to work tomorrow but is just not feeling better. Should she try another ABX? She is also. Is having a pain in her back so I just advised her to come in due to the infection maybe going into her kidneys.

## 2015-03-26 NOTE — Telephone Encounter (Addendum)
Pt wanted Dr.Smith to know she still have a UTI and have been on the antibiotics since Monday and now feeling any better, was told we would refer her to a UROLOGIST if needed Please call [phone]

## 2015-03-26 NOTE — Telephone Encounter (Signed)
lmom to cb. 

## 2015-03-27 ENCOUNTER — Ambulatory Visit (INDEPENDENT_AMBULATORY_CARE_PROVIDER_SITE_OTHER): Payer: BC Managed Care – PPO

## 2015-03-27 ENCOUNTER — Ambulatory Visit (INDEPENDENT_AMBULATORY_CARE_PROVIDER_SITE_OTHER): Payer: BC Managed Care – PPO | Admitting: Family Medicine

## 2015-03-27 VITALS — BP 104/66 | HR 73 | Temp 98.3°F | Resp 20 | Ht 64.5 in | Wt 150.0 lb

## 2015-03-27 DIAGNOSIS — R059 Cough, unspecified: Secondary | ICD-10-CM

## 2015-03-27 DIAGNOSIS — R05 Cough: Secondary | ICD-10-CM | POA: Diagnosis not present

## 2015-03-27 DIAGNOSIS — R509 Fever, unspecified: Secondary | ICD-10-CM

## 2015-03-27 DIAGNOSIS — J111 Influenza due to unidentified influenza virus with other respiratory manifestations: Secondary | ICD-10-CM | POA: Diagnosis not present

## 2015-03-27 DIAGNOSIS — N390 Urinary tract infection, site not specified: Secondary | ICD-10-CM | POA: Diagnosis not present

## 2015-03-27 LAB — POCT CBC
GRANULOCYTE PERCENT: 34.8 % — AB (ref 37–80)
HEMATOCRIT: 39.5 % (ref 37.7–47.9)
Hemoglobin: 13.3 g/dL (ref 12.2–16.2)
Lymph, poc: 1.9 (ref 0.6–3.4)
MCH, POC: 29.9 pg (ref 27–31.2)
MCHC: 33.8 g/dL (ref 31.8–35.4)
MCV: 88.6 fL (ref 80–97)
MID (cbc): 0.2 (ref 0–0.9)
MPV: 7.3 fL (ref 0–99.8)
POC GRANULOCYTE: 1.1 — AB (ref 2–6.9)
POC LYMPH %: 58.3 % — AB (ref 10–50)
POC MID %: 6.9 %M (ref 0–12)
Platelet Count, POC: 241 10*3/uL (ref 142–424)
RBC: 4.46 M/uL (ref 4.04–5.48)
RDW, POC: 12.6 %
WBC: 3.3 10*3/uL — AB (ref 4.6–10.2)

## 2015-03-27 LAB — POCT URINALYSIS DIPSTICK
Bilirubin, UA: NEGATIVE
Glucose, UA: NEGATIVE
Ketones, UA: NEGATIVE
LEUKOCYTES UA: NEGATIVE
Nitrite, UA: NEGATIVE
PH UA: 7
Protein, UA: NEGATIVE
Spec Grav, UA: 1.01
Urobilinogen, UA: 0.2

## 2015-03-27 LAB — POCT UA - MICROSCOPIC ONLY
Casts, Ur, LPF, POC: NEGATIVE
Crystals, Ur, HPF, POC: NEGATIVE
Epithelial cells, urine per micros: NEGATIVE
MUCUS UA: NEGATIVE
RBC, urine, microscopic: NEGATIVE
WBC, UR, HPF, POC: NEGATIVE
Yeast, UA: NEGATIVE

## 2015-03-27 LAB — POCT INFLUENZA A/B
INFLUENZA B, POC: NEGATIVE
Influenza A, POC: NEGATIVE

## 2015-03-27 MED ORDER — HYDROCOD POLST-CPM POLST ER 10-8 MG/5ML PO SUER: 5.0000 mL | Freq: Two times a day (BID) | ORAL | Status: DC | PRN

## 2015-03-27 NOTE — Progress Notes (Addendum)
Subjective:   This chart was scribed for Angela Forts, MD by Erling Conte, ED Scribe. This patient was seen in Room 12 and the patient's care was started at 4:42 PM.   Patient ID: Angela Morgan, female    DOB: 12/31/1970, 44 y.o.   MRN: [id number]  Chief Complaint  Patient presents with  . Back Pain    twinges of pain in her right flank area  . Fever    fever all week.    . Cough    hacking cough that is non-productive    HPI HPI Comments: Angela Morgan is a 44 y.o. female who presents to the [facility] complaining of intermittent, moderate, fever for 5 days. She states her fever has gotten as high as 102 F and has ranged from 101.7-102 F. Her temp in the office today was 98.3 F. Pt was seen by Dr. Tamala Julian on 4/7 for UTI symptoms and treated her with Cipro and Pyridium. At that time urine culture was positive for e. Coli. She was also seen here 3 days ago with fever of 101.2 F. She was seen by Dr. Ouida Sills and had large amount of bacteria and blood in urine. Urine culture was positive for e. Coli again and she was prescribed Bactrim. She does note some mild dizziness and right flank pain. Pt was referred to urology by Dr. Ouida Sills at her appt 3 days ago. She denies any hematuria, urinary frequency, urinary urgency, dysuria, foul smelling urine, excessive urination at night, nausea, vomiting, or diarrhea.  Pt has a secondary complaint of intermittent, "hacking", non productive cough for 4 days. She is having associated SOB, mild wheezing, rhinorrhea and nasal congestion. Pt took Nyquil with no relief. She denies any HA, otalgia, sore throat, or sinus pressure.     Review of Systems  Constitutional: Positive for fever (t-max 102 F), chills, diaphoresis and fatigue.  HENT: Positive for congestion and rhinorrhea. Negative for ear pain, sinus pressure, sore throat, trouble swallowing and voice change.   Respiratory: Positive for cough, shortness of breath  and wheezing (mild).   Gastrointestinal: Negative for nausea, vomiting, abdominal pain and diarrhea.  Genitourinary: Positive for flank pain. Negative for dysuria, urgency, frequency and hematuria.  Skin: Negative for rash.  Neurological: Positive for dizziness. Negative for headaches.      Objective:   Physical Exam  Constitutional: She is oriented to person, place, and time. She appears well-developed and well-nourished. No distress.  HENT:  Head: Normocephalic and atraumatic.  Right Ear: Tympanic membrane, external ear and ear canal normal.  Left Ear: Tympanic membrane, external ear and ear canal normal.  Nose: Nose normal. Right sinus exhibits no maxillary sinus tenderness and no frontal sinus tenderness. Left sinus exhibits no maxillary sinus tenderness and no frontal sinus tenderness.  Mouth/Throat: Uvula is midline, oropharynx is clear and moist and mucous membranes are normal.  Eyes: Conjunctivae and EOM are normal.  Neck: Neck supple. No tracheal deviation present.  Cardiovascular: Normal rate, regular rhythm and normal heart sounds.   No murmur heard. Pulmonary/Chest: Effort normal and breath sounds normal. No respiratory distress. She has no wheezes. She has no rales.  Abdominal: Soft. Normal appearance and bowel sounds are normal. She exhibits no distension and no mass. There is tenderness (mild diffuse) in the suprapubic area. There is CVA tenderness (right). There is no rebound and no guarding.  Musculoskeletal: Normal range of motion.  Neurological: She is alert and oriented to person, place, and  time.  Skin: Skin is warm and dry. No rash noted.  Psychiatric: She has a normal mood and affect. Her behavior is normal.  Nursing note and vitals reviewed.  Results for orders placed or performed in visit on 03/27/15  POCT CBC  Result Value Ref Range   WBC 3.3 (A) 4.6 - 10.2 K/uL   Lymph, poc 1.9 0.6 - 3.4   POC LYMPH PERCENT 58.3 (A) 10 - 50 %L   MID (cbc) 0.2 0 - 0.9    POC MID % 6.9 0 - 12 %M   POC Granulocyte 1.1 (A) 2 - 6.9   Granulocyte percent 34.8 (A) 37 - 80 %G   RBC 4.46 4.04 - 5.48 M/uL   Hemoglobin 13.3 12.2 - 16.2 g/dL   HCT, POC 39.5 37.7 - 47.9 %   MCV 88.6 80 - 97 fL   MCH, POC 29.9 27 - 31.2 pg   MCHC 33.8 31.8 - 35.4 g/dL   RDW, POC 12.6 %   Platelet Count, POC 241 142 - 424 K/uL   MPV 7.3 0 - 99.8 fL  POCT Influenza A/B  Result Value Ref Range   Influenza A, POC Negative    Influenza B, POC Negative   POCT urinalysis dipstick  Result Value Ref Range   Color, UA yellow    Clarity, UA clear    Glucose, UA neg    Bilirubin, UA neg    Ketones, UA neg    Spec Grav, UA 1.010    Blood, UA trace-lysed    pH, UA 7.0    Protein, UA neg    Urobilinogen, UA 0.2    Nitrite, UA neg    Leukocytes, UA Negative   POCT UA - Microscopic Only  Result Value Ref Range   WBC, Ur, HPF, POC neg    RBC, urine, microscopic neg    Bacteria, U Microscopic trace    Mucus, UA neg    Epithelial cells, urine per micros neg    Crystals, Ur, HPF, POC neg    Casts, Ur, LPF, POC neg    Yeast, UA neg    [facility] reading (PRIMARY) by  Dr. Tamala Julian.  CXR:  NAD      Assessment & Plan:   1. Other specified fever   2. Cough   3. UTI (lower urinary tract infection)   4. Influenza      1. Influenza:  New.  Onset five days ago; source of fever, nasal congestion, coughing.  Rx for Tussionex provided.  Treat supportively with rest, fluids, Tylenol or Motrin. 2.  UTI: improved; continue Bactrim bid.  To undergo urology consultation due to repeat UTIs in past six months.    Meds ordered this encounter  Medications  . chlorpheniramine-HYDROcodone (TUSSIONEX PENNKINETIC ER) 10-8 MG/5ML SUER    Sig: Take 5 mLs by mouth every 12 (twelve) hours as needed for cough.    Dispense:  120 mL    Refill:  0     I personally performed the services described in this documentation, which was scribed in my presence. The recorded information has been reviewed and  considered.  Kristi Elayne Guerin, M.D. [facility] [address] [place], [place]  [address] [phone] phone [phone] fax

## 2015-03-27 NOTE — Patient Instructions (Signed)

## 2015-03-28 LAB — URINE CULTURE
Colony Count: NO GROWTH
Organism ID, Bacteria: NO GROWTH

## 2015-03-28 NOTE — Op Note (Signed)
PATIENT NAME:  Angela Morgan, Angela Morgan MR#:  [id number] DATE OF BIRTH:  Apr 10, 1971  DATE OF PROCEDURE:  04/27/2013  PREOPERATIVE DIAGNOSIS:  Pilonidal sinus.   POSTOPERATIVE DIAGNOSIS:  Pilonidal sinus.   PROCEDURE:  Excision of pilonidal sinus and cyst.  SURGEON:  Mckinley Jewel, MD  ANESTHESIA:  General.   COMPLICATIONS:  None.   ESTIMATED BLOOD LOSS:  Less than 20 mL.   DRAINS:  None.   DESCRIPTION OF PROCEDURE:  The patient was put to sleep on the stretcher and then placed in the prone position with jackknife on the table. The patient's pilonidal process was in the upper end of the gluteal cleft and had a recently active process located just to the left of midline about an inch from a skin opening in the midline inferiorly. The tract was palpable along this area. No active drainage noted at this time. This area was prepped and draped out as a sterile field. Time-out procedure was performed. An elliptical excision of this tract was then performed and extended perpendicular to the skin down to the fascia and excised out. Bleeding was controlled with the cautery. The skin and subcutaneous tissue was elevated on both sides, and the deep and subcutaneous tissue was approximated with 2-0 Monocryl stitch incorporating the fascia in the stitches. Additional layer of 2-0 Monocryl was placed in the subcutaneous tissue in an interrupted fashion. The skin was closed with subcuticular 3-0 Monocryl, and about 8 mL of 0.5% Marcaine was instilled into the wound for postoperative analgesia. The incision was covered with Dermabond and a light padded dressing placed over this. The patient tolerated the procedure well. She was subsequently returned to the Recovery Room after extubation in stable condition.   ____________________________ S.Robinette Haines, MD sgs:jm D: 04/27/2013 08:22:42 ET T: 04/27/2013 10:47:27 ET JOB#: [id number]  cc: Synthia Innocent. Jamal Collin, MD, <Dictator> [facility] Robinette Haines MD ELECTRONICALLY SIGNED 04/30/2013  7:30

## 2015-04-04 ENCOUNTER — Telehealth: Payer: Self-pay | Admitting: Family Medicine

## 2015-04-04 NOTE — Telephone Encounter (Signed)
Patient states that she has been experiencing dizzy spells since last Monday. She states that these are not the type of dizzy spells she normally has/is used to. States that she only feels dizzy on her right side. She is also finished with her antibiotic. Please advise.   [phone]

## 2015-04-04 NOTE — Telephone Encounter (Signed)
lmom to cb. 

## 2015-04-04 NOTE — Telephone Encounter (Signed)
Recommend office visit

## 2015-04-04 NOTE — Telephone Encounter (Signed)
lmom to advise pt to come in for OV.

## 2015-04-04 NOTE — Telephone Encounter (Signed)
Patient called back. Gave her Dr. Thompson Caul message to come in for an OV. She states that she will come in on 5/5 during Dr. Thompson Caul walk in clinic.

## 2015-04-09 ENCOUNTER — Ambulatory Visit: Payer: BC Managed Care – PPO | Admitting: Family Medicine

## 2015-08-27 ENCOUNTER — Ambulatory Visit (INDEPENDENT_AMBULATORY_CARE_PROVIDER_SITE_OTHER): Payer: BC Managed Care – PPO | Admitting: Family Medicine

## 2015-08-27 VITALS — BP 98/60 | HR 61 | Temp 98.2°F | Resp 16 | Ht 64.5 in | Wt 149.0 lb

## 2015-08-27 DIAGNOSIS — L03319 Cellulitis of trunk, unspecified: Secondary | ICD-10-CM | POA: Diagnosis not present

## 2015-08-27 DIAGNOSIS — L02219 Cutaneous abscess of trunk, unspecified: Secondary | ICD-10-CM | POA: Diagnosis not present

## 2015-08-27 MED ORDER — SULFAMETHOXAZOLE-TRIMETHOPRIM 800-160 MG PO TABS: 2.0000 | ORAL_TABLET | Freq: Two times a day (BID) | ORAL | Status: DC

## 2015-08-27 MED ORDER — HYDROCODONE-ACETAMINOPHEN 5-325 MG PO TABS: 1.0000 | ORAL_TABLET | Freq: Four times a day (QID) | ORAL | Status: DC | PRN

## 2015-08-27 MED ORDER — CEPHALEXIN 500 MG PO CAPS: 500.0000 mg | ORAL_CAPSULE | Freq: Four times a day (QID) | ORAL | Status: DC

## 2015-08-27 NOTE — Patient Instructions (Addendum)
Continue the antibiotic as prescribed. Apply a warm compress to the area for 15-20 minutes 2-4 times each day. Change the dressing if it becomes saturated, leaks or falls off.  Abscess An abscess is an infected area that contains a collection of pus and debris.It can occur in almost any part of the body. An abscess is also known as a furuncle or boil. CAUSES  An abscess occurs when tissue gets infected. This can occur from blockage of oil or sweat glands, infection of hair follicles, or a minor injury to the skin. As the body tries to fight the infection, pus collects in the area and creates pressure under the skin. This pressure causes pain. People with weakened immune systems have difficulty fighting infections and get certain abscesses more often.  SYMPTOMS Usually an abscess develops on the skin and becomes a painful mass that is red, warm, and tender. If the abscess forms under the skin, you may feel a moveable soft area under the skin. Some abscesses break open (rupture) on their own, but most will continue to get worse without care. The infection can spread deeper into the body and eventually into the bloodstream, causing you to feel ill.  DIAGNOSIS  Your caregiver will take your medical history and perform a physical exam. A sample of fluid may also be taken from the abscess to determine what is causing your infection. TREATMENT  Your caregiver may prescribe antibiotic medicines to fight the infection. However, taking antibiotics alone usually does not cure an abscess. Your caregiver may need to make a small cut (incision) in the abscess to drain the pus. In some cases, gauze is packed into the abscess to reduce pain and to continue draining the area. HOME CARE INSTRUCTIONS   Only take over-the-counter or prescription medicines for pain, discomfort, or fever as directed by your caregiver.  If you were prescribed antibiotics, take them as directed. Finish them even if you start to feel  better.  If gauze is used, follow your caregiver's directions for changing the gauze.  To avoid spreading the infection:  Keep your draining abscess covered with a bandage.  Wash your hands well.  Do not share personal care items, towels, or whirlpools with others.  Avoid skin contact with others.  Keep your skin and clothes clean around the abscess.  Keep all follow-up appointments as directed by your caregiver. SEEK MEDICAL CARE IF:   You have increased pain, swelling, redness, fluid drainage, or bleeding.  You have muscle aches, chills, or a general ill feeling.  You have a fever. MAKE SURE YOU:   Understand these instructions.  Will watch your condition.  Will get help right away if you are not doing well or get worse. Document Released: 09/01/2005 Document Revised: 05/23/2012 Document Reviewed: 02/04/2012 [facility] Patient Information 2015 [place], [place]. This information is not intended to replace advice given to you by your health care provider. Make sure you discuss any questions you have with your health care provider.

## 2015-08-27 NOTE — Progress Notes (Signed)
Verbal Consent Obtained. Local anesthesia with 1 cc of 2% lidocaine plain.  11 blade used to incise the lesion centrally.  Purulence expressed. Irrigated wound with 4 cc of 2% lidocaine and packed with 1/4 inch plain packing.  Cleansed and dressed.

## 2015-08-27 NOTE — Progress Notes (Signed)
Subjective:  This chart was scribed for [name], MD by Thea Alken, ED Scribe. This patient was seen in room 3 and the patient's care was started at 5:04 PM.   Patient ID: Angela Morgan, female    DOB: Apr 27, 1971, 44 y.o.   MRN: [id number]  HPI  Chief Complaint  Patient presents with   Cyst    Lower right abdomen x 3 days   HPI Comments: Angela Morgan is a 44 y.o. female who presents to the [facility] complaining of painful, red cyst to lower abdomen noticed 2 days ago. Pt has had improved warmth, redness and swelling to area, stating area was more red, swollen yesterday. She denies fever, chills and sweats.   Patient Active Problem List   Diagnosis Date Noted   Pilonidal cyst 03/28/2013   Migraine, unspecified, without mention of intractable migraine without mention of status migrainosus 03/15/2013   Neck strain 03/15/2013   Dizziness and giddiness 03/15/2013   Contact dermatitis 03/15/2013   Premature menopause on HRT 01/09/2013   Vestibular neuritis 08/04/2012   Depression with anxiety 08/04/2012   Insomnia 08/04/2012   Past Medical History  Diagnosis Date   Anxiety    Depression    GERD (gastroesophageal reflux disease)    Migraine, unspecified, without mention of intractable migraine without mention of status migrainosus    Arthropathy, unspecified, site unspecified    Allergic rhinitis, cause unspecified    Situational, disturbance, acute    Vestibular dysfunction    Vestibular neuritis    Insomnia, unspecified    Symptomatic states associated with artificial menopause    Urticaria, unspecified    Depressive disorder, not elsewhere classified    Adjustment disorder with depressed mood    Fibromyalgia    Postablative ovarian failure    Irritable bowel syndrome    Cellulitis 11/2007    Orbital Shingles   Hospitalized   Allergy    Prior to Admission medications   Medication Sig Start Date  End Date Taking? Authorizing Provider  clonazePAM (KLONOPIN) 0.5 MG tablet Take 1 tablet (0.5 mg total) by mouth daily as needed for anxiety. 03/13/15  Yes Wardell Honour, MD  estradiol (ESTRACE) 1 MG tablet Take 1 mg by mouth daily.   Yes Historical Provider, MD  ibuprofen (ADVIL,MOTRIN) 600 MG tablet Take 1 tablet (600 mg total) by mouth every 6 (six) hours as needed. 09/08/14  Yes Junius Creamer, NP  Multiple Vitamin (MULTIVITAMIN) tablet Take 1 tablet by mouth daily.   Yes Historical Provider, MD  NON FORMULARY 1 suppository by Other route every other day. Estrogen/testosterone suppository/ pill every other day.   Yes Historical Provider, MD  Progesterone Micronized (PROGESTERONE PO) Take 75 mg by mouth daily.   Yes Historical Provider, MD  VITAMIN D, CHOLECALCIFEROL, PO Take by mouth daily.   Yes Historical Provider, MD   Review of Systems  Constitutional: Negative for fever, chills and diaphoresis.  Skin: Positive for color change. Negative for pallor, rash and wound.   Objective:   Physical Exam  Constitutional: She is oriented to person, place, and time. She appears well-developed and well-nourished. No distress.  HENT:  Head: Normocephalic and atraumatic.  Eyes: Conjunctivae and EOM are normal.  Neck: Neck supple.  Cardiovascular: Normal rate.   Pulmonary/Chest: Effort normal.  Musculoskeletal: Normal range of motion.  Neurological: She is alert and oriented to person, place, and time.  Skin: Skin is warm and dry. There is erythema.  3.5cm x 8 cm area of erythema with a central pustule at lower abdomen.   Psychiatric: She has a normal mood and affect. Her behavior is normal.  Nursing note and vitals reviewed.  Filed Vitals:   08/27/15 1657  BP: 98/60  Pulse: 61  Temp: 98.2 F (36.8 C)  TempSrc: Oral  Resp: 16  Height: 5' 4.5" (1.638 m)  Weight: 149 lb (67.586 kg)  SpO2: 99%   Assessment & Plan:  1. Cellulitis and abscess of trunk -New.  S/p I&D during visit. -  Wound culture obtained. -Rx for Keflex, Bactrim, Hydrocodone provided.  -RTC 48 hours for wound check.   -RTC sooner for fever>101, increasing pain, redness, swelling.   Meds ordered this encounter  Medications   DISCONTD: sulfamethoxazole-trimethoprim (BACTRIM DS,SEPTRA DS) 800-160 MG per tablet    Sig: Take 2 tablets by mouth 2 (two) times daily.    Dispense:  40 tablet    Refill:  0   DISCONTD: cephALEXin (KEFLEX) 500 MG capsule    Sig: Take 1 capsule (500 mg total) by mouth 4 (four) times daily.    Dispense:  40 capsule    Refill:  0   HYDROcodone-acetaminophen (NORCO/VICODIN) 5-325 MG per tablet    Sig: Take 1 tablet by mouth every 6 (six) hours as needed for moderate pain.    Dispense:  30 tablet    Refill:  0    By signing my name below, I, Raven Small, attest that this documentation has been prepared under the direction and in the presence of [name], MD.  Electronically Signed: Thea Alken, ED Scribe. 08/27/2015. 5:15 PM.  I personally performed the services described in this documentation, which was scribed in my presence. The recorded information has been reviewed and considered. Kristi Elayne Guerin, M.D. [facility] [address] [place], [place]  [address] [phone] phone [phone] fax

## 2015-08-29 ENCOUNTER — Ambulatory Visit (INDEPENDENT_AMBULATORY_CARE_PROVIDER_SITE_OTHER): Payer: BC Managed Care – PPO | Admitting: Physician Assistant

## 2015-08-29 VITALS — BP 120/60 | HR 71 | Temp 99.1°F | Resp 16 | Ht 64.5 in | Wt 149.2 lb

## 2015-08-29 DIAGNOSIS — L02219 Cutaneous abscess of trunk, unspecified: Secondary | ICD-10-CM | POA: Diagnosis not present

## 2015-08-29 DIAGNOSIS — L03319 Cellulitis of trunk, unspecified: Secondary | ICD-10-CM | POA: Diagnosis not present

## 2015-08-29 DIAGNOSIS — Z5189 Encounter for other specified aftercare: Secondary | ICD-10-CM

## 2015-08-29 LAB — POCT CBC
GRANULOCYTE PERCENT: 68.6 % (ref 37–80)
HCT, POC: 36.7 % — AB (ref 37.7–47.9)
Hemoglobin: 11.8 g/dL — AB (ref 12.2–16.2)
Lymph, poc: 1.7 (ref 0.6–3.4)
MCH: 28.5 pg (ref 27–31.2)
MCHC: 32.1 g/dL (ref 31.8–35.4)
MCV: 88.7 fL (ref 80–97)
MID (cbc): 0.6 (ref 0–0.9)
MPV: 6.9 fL (ref 0–99.8)
POC Granulocyte: 5 (ref 2–6.9)
POC LYMPH PERCENT: 23 %L (ref 10–50)
POC MID %: 8.4 %M (ref 0–12)
Platelet Count, POC: 264 10*3/uL (ref 142–424)
RBC: 4.14 M/uL (ref 4.04–5.48)
RDW, POC: 12.5 %
WBC: 7.3 10*3/uL (ref 4.6–10.2)

## 2015-08-29 MED ORDER — DOXYCYCLINE HYCLATE 100 MG PO CAPS: 100.0000 mg | ORAL_CAPSULE | Freq: Two times a day (BID) | ORAL | Status: DC

## 2015-08-29 NOTE — Progress Notes (Signed)
08/29/2015 at 11:25 AM  Angela Morgan / DOB: 1971/02/09 / MRN: [id number]  The patient has Vestibular neuritis; Depression with anxiety; Insomnia; Premature menopause on HRT; Migraine, unspecified, without mention of intractable migraine without mention of status migrainosus; Neck strain; Dizziness and giddiness; Contact dermatitis; and Pilonidal cyst on her problem list.  SUBJECTIVE  [name] is a well appearing 44 y.o. here today for wound care. She continues to complain of exquisite tenderness at the site of the wound and now reports a low grade fever and chills that started this morning. Denies nausea at this time.   She has been compliant with medical therapy and recommendations thus far.  Chart review reveals abundant staph without sensitivities resulted.  She is currently taking Bactrim.    She  has a past medical history of Anxiety; Depression; GERD (gastroesophageal reflux disease); Migraine, unspecified, without mention of intractable migraine without mention of status migrainosus; Arthropathy, unspecified, site unspecified; Allergic rhinitis, cause unspecified; Situational, disturbance, acute; Vestibular dysfunction; Vestibular neuritis; Insomnia, unspecified; Symptomatic states associated with artificial menopause; Urticaria, unspecified; Depressive disorder, not elsewhere classified; Adjustment disorder with depressed mood; Fibromyalgia; Postablative ovarian failure; Irritable bowel syndrome; Cellulitis (11/2007); and Allergy.    Medications reviewed and updated by myself where necessary, and exist elsewhere in the encounter.   [name] is allergic to biaxin. She  reports that she has never smoked. She has never used smokeless tobacco. She reports that she drinks about 2.4 oz of alcohol per week. She reports that she does not use illicit drugs. She  reports that she currently engages in sexual activity. She reports using the following method of birth control/protection:  Post-menopausal. The patient  has past surgical history that includes Total abdominal hysterectomy; Knee surgery; Temporomandibular joint surgery; Appendectomy; Cesarean section; and Cholecystectomy.  Her family history includes Aneurysm in her mother; COPD in her mother; Depression in her father; Diabetes in her maternal grandfather; Heart disease in her father; Hyperlipidemia in her brother, father, and mother; Hypertension in her brother, brother, father, and mother; Mental illness in her father; Stroke in her mother; Transient ischemic attack in her mother.  Review of Systems  Constitutional: Negative for fever, chills and diaphoresis.    OBJECTIVE  Her  height is 5' 4.5" (1.638 m) and weight is 149 lb 3.2 oz (67.677 kg). Her oral temperature is 99.1 F (37.3 C). Her blood pressure is 120/60 and her pulse is 71. Her respiration is 16 and oxygen saturation is 98%.  The patient's body mass index is 25.22 kg/(m^2).  Physical Exam  Constitutional: She is oriented to person, place, and time. She appears well-developed and well-nourished. No distress.  Eyes: Pupils are equal, round, and reactive to light.  Cardiovascular: Normal rate.   Respiratory: Effort normal. No respiratory distress.  GI: She exhibits no distension.    Neurological: She is alert and oriented to person, place, and time.  Skin: Skin is warm and dry. She is not diaphoretic.  Psychiatric: She has a normal mood and affect. Her behavior is normal. Judgment and thought content normal.    Recent Results (from the past 2160 hour(s))  Wound culture     Status: None (Preliminary result)   Collection Time: 08/27/15  6:01 PM  Result Value Ref Range   Gram Stain Rare    Gram Stain WBC present-both PMN and Mononuclear    Gram Stain No Squamous Epithelial Cells Seen    Gram Stain No Organisms Seen    Preliminary Report Abundant STAPHYLOCOCCUS  AUREUS     Comment: Rifampin and Gentamicin should not be used as single drugs for  treatment of Staph infections.   POCT CBC     Status: Abnormal   Collection Time: 08/29/15 11:24 AM  Result Value Ref Range   WBC 7.3 4.6 - 10.2 K/uL   Lymph, poc 1.7 0.6 - 3.4   POC LYMPH PERCENT 23.0 10 - 50 %L   MID (cbc) 0.6 0 - 0.9   POC MID % 8.4 0 - 12 %M   POC Granulocyte 5.0 2 - 6.9   Granulocyte percent 68.6 37 - 80 %G   RBC 4.14 4.04 - 5.48 M/uL   Hemoglobin 11.8 (A) 12.2 - 16.2 g/dL   HCT, POC 36.7 (A) 37.7 - 47.9 %   MCV 88.7 80 - 97 fL   MCH, POC 28.5 27 - 31.2 pg   MCHC 32.1 31.8 - 35.4 g/dL   RDW, POC 12.5 %   Platelet Count, POC 264 142 - 424 K/uL   MPV 6.9 0 - 99.8 fL     ASSESSMENT & PLAN  Malu was seen today for follow-up, chills and fever.  Diagnoses and all orders for this visit:  Cellulitis and abscess of trunk: Culture growing staph and no sensitivities available at this time.  Holding Keflex and starting doxy.  She is to continue TMP/SMX and will start doxy for better staph coverage. Will call patient if medication changes are warranted, however her CBC is reassuring. -     Cancel: CBC with Differential/Platelet -     POCT CBC  Encounter for wound care: Patient anesthetized given level of tenderness with wound examination.  Wound washed out with copious NS.  Abx managed with problem 1.     The patient was advised to call or come back to clinic if she does not see an improvement in symptoms, or worsens with the above plan.   Philis Fendt, MHS, PA-C [facility] 08/29/2015 11:25 AM

## 2015-08-30 ENCOUNTER — Telehealth: Payer: Self-pay | Admitting: Physician Assistant

## 2015-08-30 LAB — WOUND CULTURE
GRAM STAIN: NONE SEEN
Gram Stain: NONE SEEN

## 2015-08-30 NOTE — Telephone Encounter (Signed)
Pt called back and had a question about the doxy.  Advised her that the culture was sensitive to that antibiotic and it should clear up with just that and no iv antibiotics.

## 2015-08-30 NOTE — Telephone Encounter (Signed)
Left VM with patient regarding culture results.  Stopping TMP/SMX, and advised that she finish the course of doxy.  Philis Fendt, MS, PA-C   10:08 AM, 08/30/2015

## 2015-08-31 ENCOUNTER — Ambulatory Visit (INDEPENDENT_AMBULATORY_CARE_PROVIDER_SITE_OTHER): Payer: BC Managed Care – PPO | Admitting: Physician Assistant

## 2015-08-31 VITALS — BP 102/80 | HR 60 | Temp 98.4°F | Resp 16 | Ht 64.0 in | Wt 147.4 lb

## 2015-08-31 DIAGNOSIS — L0291 Cutaneous abscess, unspecified: Secondary | ICD-10-CM

## 2015-08-31 DIAGNOSIS — R5383 Other fatigue: Secondary | ICD-10-CM | POA: Diagnosis not present

## 2015-08-31 DIAGNOSIS — L0390 Cellulitis, unspecified: Secondary | ICD-10-CM | POA: Diagnosis not present

## 2015-08-31 MED ORDER — CEFTRIAXONE SODIUM 1 G IJ SOLR: 1.0000 g | Freq: Once | INTRAMUSCULAR | Status: DC

## 2015-08-31 NOTE — Patient Instructions (Signed)
Continue with the doxycycline. Return in 2 days for wound care follow up.

## 2015-08-31 NOTE — Progress Notes (Signed)
[facility] [address], [place] [address] [phone]  Date:  08/31/2015   Name:  Angela Morgan   DOB:  03/12/1971   MRN:  [id number]  PCP:  [name], MD    History of Present Illness:  Angela Morgan is a 44 y.o. female patient who [facility] wound care of abscess status post I and D,  4 days ago.  Patient states that the pain is doing a little better, but she is feeling blah.  She states that she has decreased appetite and fatigue.  She is changing the dressing.  She has less drainage, no fever, and with some nausea.    Patient Active Problem List   Diagnosis Date Noted  . Pilonidal cyst 03/28/2013  . Migraine, unspecified, without mention of intractable migraine without mention of status migrainosus 03/15/2013  . Neck strain 03/15/2013  . Dizziness and giddiness 03/15/2013  . Contact dermatitis 03/15/2013  . Premature menopause on HRT 01/09/2013  . 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 Depression with anxiety 08/04/2012  . Insomnia 08/04/2012    Past Medical History  Diagnosis Date  . Anxiety   . Depression   . GERD (gastroesophageal reflux disease)   . Migraine, unspecified, without mention of intractable migraine without mention of status migrainosus   . Arthropathy, unspecified, site unspecified   . Allergic rhinitis, cause unspecified   . Situational, disturbance, acute   . Vestibular dysfunction   . Vestibular neuritis   . Insomnia, unspecified   . Symptomatic states associated with artificial menopause   . Urticaria, unspecified   . Depressive disorder, not elsewhere classified   . Adjustment disorder with depressed mood   . Fibromyalgia   . Postablative ovarian failure   . Irritable bowel syndrome   . Cellulitis 11/2007    Orbital Shingles   Hospitalized  . Allergy     Past Surgical History  Procedure Laterality Date  . Total abdominal hysterectomy      ovaries removed  . Knee surgery      right  .  Temporomandibular joint surgery    . Appendectomy    . Cesarean section      x 2  . Cholecystectomy      Social History  Substance Use Topics  . Smoking status: Never Smoker   . Smokeless tobacco: Never Used  . Alcohol Use: 2.4 oz/week    4 Glasses of wine per week    Family History  Problem Relation Age of Onset  . Diabetes Maternal Grandfather   . Hypertension Mother   . Stroke Mother   . COPD Mother   . Aneurysm Mother     brain  . Transient ischemic attack Mother     multiple  . Hyperlipidemia Mother   . Hypertension Father   . Heart disease Father   . Mental illness Father   . Depression Father   . Hyperlipidemia Father   . Hyperlipidemia Brother   . Hypertension Brother   . Hypertension Brother     Allergies  Allergen Reactions  . Biaxin [Clarithromycin] Anaphylaxis    Medication list has been reviewed and updated.  Current Outpatient Prescriptions on File Prior to Visit  Medication Sig Dispense Refill  . clonazePAM (KLONOPIN) 0.5 MG tablet Take 1 tablet (0.5 mg total) by mouth daily as needed for anxiety. 30 tablet 1  . doxycycline (VIBRAMYCIN) 100 MG capsule Take 1 capsule (100 mg total) by mouth 2 (two) times daily. Wayne  capsule 0  . estradiol (ESTRACE) 1 MG tablet Take 1 mg by mouth daily.    Marland Kitchen HYDROcodone-acetaminophen (NORCO/VICODIN) 5-325 MG per tablet Take 1 tablet by mouth every 6 (six) hours as needed for moderate pain. 30 tablet 0  . ibuprofen (ADVIL,MOTRIN) 600 MG tablet Take 1 tablet (600 mg total) by mouth every 6 (six) hours as needed. 30 tablet 0  . Multiple Vitamin (MULTIVITAMIN) tablet Take 1 tablet by mouth daily.    . NON FORMULARY 1 suppository by Other route every other day. Estrogen/testosterone suppository/ pill every other day.    . Progesterone Micronized (PROGESTERONE PO) Take 75 mg by mouth daily.    Marland Kitchen VITAMIN D, CHOLECALCIFEROL, PO Take by mouth daily.     No current facility-administered medications on file prior to visit.     ROS ROS otherwise unremarkable unless listed above.   Physical Examination: BP 102/80 mmHg  Pulse 60  Temp(Src) 98.4 F (36.9 C) (Oral)  Resp 16  Ht 5\' 4"  (1.626 m)  Wt 147 lb 6.4 oz (66.86 kg)  BMI 25.29 kg/m2  SpO2 98% Ideal Body Weight: Weight in (lb) to have BMI = 25: 145.3  Physical Exam  Abdominal:    Wound with exudative non-adhered clump that was removed with ease.  No necrosis and wound reveals beefy red tissue.  Wound depth about 1 cm.   Just superior-lateral to wound is ecchymosis tracks with red papules, consistent with possible prior injections.  Induration at inferior lateral at the wound.  Mild erythema along the abdomen with tenderness to palpation.     Verbal consent obtained.  Wound irrigated with 1%lidocaine.  Wound searched without necrosis, purulent drainage, or tracking.  Irrigated again with normal saline.  Gently packed.  Dressings applied.     Assessment and Plan: 44 year old female is here today for wound care.  This is healing well.  Gently packed.  Next visit may be the last.  Doxycycline covering well.  Given rocephin 1g at this time to promote healing, as recommended by Dr. Elder Cyphers.  Patient was amicable to this as well. Continue doxycycline rtn in 2 days.  Cellulitis and abscess - Plan: cefTRIAXone (ROCEPHIN) injection 1 g,   Other fatigue - Plan: cefTRIAXone (ROCEPHIN) injection 1 g,    Ivar Drape, PA-C [facility] 08/31/2015 2:48 PM

## 2015-09-02 ENCOUNTER — Ambulatory Visit (INDEPENDENT_AMBULATORY_CARE_PROVIDER_SITE_OTHER): Payer: BC Managed Care – PPO | Admitting: Physician Assistant

## 2015-09-02 VITALS — BP 112/70 | HR 61 | Temp 98.2°F | Resp 18 | Wt 147.0 lb

## 2015-09-02 DIAGNOSIS — L0390 Cellulitis, unspecified: Secondary | ICD-10-CM

## 2015-09-02 DIAGNOSIS — L0291 Cutaneous abscess, unspecified: Secondary | ICD-10-CM

## 2015-09-02 NOTE — Progress Notes (Signed)
Angela Morgan  MRN: [id number] DOB: Feb 22, 1971  Subjective:  Pt presents to clinic for recheck of her abscess.  She is taking her doxycycline daily.  She still feels not 100% - more tired than normal.  She feels like she is having less pain and she is having less drainage from the area.  She is changing drsg at least daily.  Patient Active Problem List   Diagnosis Date Noted  . Pilonidal cyst 03/28/2013  . Migraine, unspecified, without mention of intractable migraine without mention of status migrainosus 03/15/2013  . Neck strain 03/15/2013  . Dizziness and giddiness 03/15/2013  . Contact dermatitis 03/15/2013  . Premature menopause on HRT 01/09/2013  . 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 Depression with anxiety 08/04/2012  . Insomnia 08/04/2012    Current Outpatient Prescriptions on File Prior to Visit  Medication Sig Dispense Refill  . clonazePAM (KLONOPIN) 0.5 MG tablet Take 1 tablet (0.5 mg total) by mouth daily as needed for anxiety. 30 tablet 1  . doxycycline (VIBRAMYCIN) 100 MG capsule Take 1 capsule (100 mg total) by mouth 2 (two) times daily. 20 capsule 0  . estradiol (ESTRACE) 1 MG tablet Take 1 mg by mouth daily.    Marland Kitchen HYDROcodone-acetaminophen (NORCO/VICODIN) 5-325 MG per tablet Take 1 tablet by mouth every 6 (six) hours as needed for moderate pain. 30 tablet 0  . ibuprofen (ADVIL,MOTRIN) 600 MG tablet Take 1 tablet (600 mg total) by mouth every 6 (six) hours as needed. 30 tablet 0  . Multiple Vitamin (MULTIVITAMIN) tablet Take 1 tablet by mouth daily.    . NON FORMULARY 1 suppository by Other route every other day. Estrogen/testosterone suppository/ pill every other day.    . Progesterone Micronized (PROGESTERONE PO) Take 75 mg by mouth daily.    Marland Kitchen VITAMIN D, CHOLECALCIFEROL, PO Take by mouth daily.     Current Facility-Administered Medications on File Prior to Visit  Medication Dose Route Frequency Provider Last Rate Last Dose  . cefTRIAXone (ROCEPHIN)  injection 1 g  1 g Intramuscular Once Stephanie D English, PA        Allergies  Allergen Reactions  . Biaxin [Clarithromycin] Anaphylaxis    Review of Systems  Constitutional: Negative for fever and chills.  Gastrointestinal: Negative for nausea.  Skin: Positive for wound. Negative for rash.   Objective:  BP 112/70 mmHg  Pulse 61  Temp(Src) 98.2 F (36.8 C) (Oral)  Resp 18  Wt 147 lb (66.679 kg)  SpO2 98%  Physical Exam  Constitutional: She is oriented to person, place, and time and well-developed, well-nourished, and in no distress.  HENT:  Head: Normocephalic and atraumatic.  Right Ear: Hearing and external ear normal.  Left Ear: Hearing and external ear normal.  Eyes: Conjunctivae are normal.  Neck: Normal range of motion.  Pulmonary/Chest: Effort normal.  Neurological: She is alert and oriented to person, place, and time. Gait normal.  Skin: Skin is warm and dry.  drsg and packing removed - wound ~1 cm deep and incision is about 1 cm long - no purulence expressed - mild erythema about 2 cm around incision area and there is a small amount about 1cm induration around the incision  Psychiatric: Mood, memory, affect and judgment normal.  Vitals reviewed.   Assessment and Plan :  Cellulitis and abscess   Finish abx - continue drsg changes daily - recheck is any problems but no f/u needed if pt is doing well.  Windell Hummingbird PA-C  [facility] 09/02/2015 5:31 PM

## 2015-10-20 ENCOUNTER — Ambulatory Visit (INDEPENDENT_AMBULATORY_CARE_PROVIDER_SITE_OTHER): Payer: BC Managed Care – PPO | Admitting: Family Medicine

## 2015-10-20 ENCOUNTER — Encounter: Payer: Self-pay | Admitting: Family Medicine

## 2015-10-20 VITALS — BP 122/75 | HR 58 | Temp 98.4°F | Resp 16 | Ht 64.25 in | Wt 146.8 lb

## 2015-10-20 DIAGNOSIS — J069 Acute upper respiratory infection, unspecified: Secondary | ICD-10-CM | POA: Diagnosis not present

## 2015-10-20 DIAGNOSIS — Z23 Encounter for immunization: Secondary | ICD-10-CM | POA: Diagnosis not present

## 2015-10-20 DIAGNOSIS — H8111 Benign paroxysmal vertigo, right ear: Secondary | ICD-10-CM

## 2015-10-20 DIAGNOSIS — K219 Gastro-esophageal reflux disease without esophagitis: Secondary | ICD-10-CM | POA: Diagnosis not present

## 2015-10-20 NOTE — Patient Instructions (Signed)
1. Continue Prevacid 20mg  two daily for one to two weeks and then decrease to one daily. 2.  Start Advil Cold & Sinus.

## 2015-10-21 ENCOUNTER — Encounter: Payer: Self-pay | Admitting: Family Medicine

## 2015-10-21 NOTE — Progress Notes (Signed)
Subjective:    Patient ID: Angela Morgan, female    DOB: Oct 27, 1971, 44 y.o.   MRN: [id number]  10/20/2015  gastric reflux and Nasal Congestion   HPI This 44 y.o. female presents for evaluation of acute symptoms.  Onset 48 hours ago.  No fever/chills/sweats.  No headache.  +R ear pain; +hearing slightly muffled for past three weeks on R.  +nasal congestion mild but green.  No rhinorrhea. +PND. +cough for two days. No SOB; no sputum; no wheezing.  +FB sensation in throat.  No n/v/d/c.  Mild abdominal discomfort.  Worried about recurrent reflux; has not needed PPI for several years with weight loss and dietary modification.  Started taking Prevacid 20mg  OTC two days ago but thinks was on a higher dose in past.  No other medications for symptoms.     Review of Systems  Constitutional: Negative for fever, chills, diaphoresis and fatigue.  HENT: Positive for congestion, postnasal drip, sinus pressure and sore throat. Negative for ear pain, rhinorrhea, trouble swallowing and voice change.   Eyes: Negative for visual disturbance.  Respiratory: Negative for cough and shortness of breath.   Cardiovascular: Negative for chest pain, palpitations and leg swelling.  Gastrointestinal: Positive for abdominal distention. Negative for nausea, vomiting, abdominal pain, diarrhea, constipation, blood in stool, anal bleeding and rectal pain.  Endocrine: Negative for cold intolerance, heat intolerance, polydipsia, polyphagia and polyuria.  Neurological: Positive for dizziness. Negative for tremors, seizures, syncope, facial asymmetry, speech difficulty, weakness, light-headedness, numbness and headaches.    Past Medical History  Diagnosis Date  . Anxiety   . Depression   . GERD (gastroesophageal reflux disease)   . Migraine, unspecified, without mention of intractable migraine without mention of status migrainosus   . Arthropathy, unspecified, site unspecified   . Allergic rhinitis, cause  unspecified   . Situational, disturbance, acute   . Vestibular dysfunction   . Vestibular neuritis   . Insomnia, unspecified   . Symptomatic states associated with artificial menopause   . Urticaria, unspecified   . Depressive disorder, not elsewhere classified   . Adjustment disorder with depressed mood   . Fibromyalgia   . Postablative ovarian failure   . Irritable bowel syndrome   . Cellulitis 11/2007    Orbital Shingles   Hospitalized  . Allergy    Past Surgical History  Procedure Laterality Date  . Total abdominal hysterectomy      ovaries removed  . Knee surgery      right  . Temporomandibular joint surgery    . Appendectomy    . Cesarean section      x 2  . Cholecystectomy     Allergies  Allergen Reactions  . Biaxin [Clarithromycin] Anaphylaxis    Social History   Social History  . Marital Status: Divorced    Spouse Name: n/a  . Number of Children: 2  . Years of Education: college   Occupational History  . 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 Smoking status: Never Smoker   . Smokeless tobacco: Never Used  . Alcohol Use: 2.4 oz/week    4 Glasses of wine per week  . Drug Use: No  . Sexual Activity: Yes    Birth Control/ Protection: Post-menopausal   Other Topics Concern  . Not on file   Social History Narrative   Marital Status:separated in 09/2013 after married x  16+ years. Getting married in [place].  Children:  2 children (22, 17); no grandchildren.       Living:  living in apartment with fiance in [place].  Son is in [place].      Employment:  Working at [employer] sine 08/2013. [employer] job.      Exercise: exercising at [facility] per week.      Always uses seat belts / helmet.       Smoke alarm in the home.       No guns in the home.     Caffeine use: moderate      Education: College                              Family History  Problem Relation Age of Onset    . Diabetes Maternal Grandfather   . Hypertension Mother   . Stroke Mother   . COPD Mother   . Aneurysm Mother     brain  . Transient ischemic attack Mother     multiple  . Hyperlipidemia Mother   . Hypertension Father   . Heart disease Father   . Mental illness Father   . Depression Father   . Hyperlipidemia Father   . Hyperlipidemia Brother   . Hypertension Brother   . Hypertension Brother        Objective:    BP 122/75 mmHg  Pulse 58  Temp(Src) 98.4 F (36.9 C) (Oral)  Resp 16  Ht 5' 4.25" (1.632 m)  Wt 146 lb 12.8 oz (66.588 kg)  BMI 25.00 kg/m2 Physical Exam  Constitutional: She is oriented to person, place, and time. She appears well-developed and well-nourished. No distress.  HENT:  Head: Normocephalic and atraumatic.  Right Ear: External ear normal.  Left Ear: External ear normal.  Nose: Nose normal.  Mouth/Throat: Oropharynx is clear and moist.  Eyes: Conjunctivae and EOM are normal. Pupils are equal, round, and reactive to light.  Neck: Normal range of motion. Neck supple. Carotid bruit is not present. No thyromegaly present.  Cardiovascular: Normal rate, regular rhythm, normal heart sounds and intact distal pulses.  Exam reveals no gallop and no friction rub.   No murmur heard. Pulmonary/Chest: Effort normal and breath sounds normal. She has no wheezes. She has no rales.  Abdominal: Soft. Bowel sounds are normal. She exhibits no distension and no mass. There is no tenderness. There is no rebound and no guarding.  Lymphadenopathy:    She has no cervical adenopathy.  Neurological: She is alert and oriented to person, place, and time. No cranial nerve deficit. She exhibits normal muscle tone. Coordination normal.  Skin: Skin is warm and dry. No rash noted. She is not diaphoretic. No erythema. No pallor.  Psychiatric: She has a normal mood and affect. Her behavior is normal.       Assessment & Plan:   1. Upper respiratory infection   2. Benign paroxysmal  positional vertigo, right   3. Gastroesophageal reflux disease without esophagitis   4. Need for prophylactic vaccination and inoculation against influenza     1. Upper respiratory infection:  New.  Supportive care with Advil Cold & Sinus and Flonase.   2.  Benign positional vertigo: New.  R sided; onset two weeks; start Flonase and Advil Cold & Sinus.  Continue home exercises. 3.  GERD: chronic with recent improvement; current symptoms most suggestive of URI with PND; however, recommend pt continue Prevacid 20mg  OTC daily. 4. S/p flu  vaccine.   Orders Placed This Encounter  Procedures  . Flu Vaccine QUAD 36+ mos IM   No orders of the defined types were placed in this encounter.    Return if symptoms worsen or fail to improve.   Tesa Meadors Elayne Guerin, M.D. [facility] [address] [place], [place]  [address] [phone] phone [phone] fax

## 2015-11-19 ENCOUNTER — Other Ambulatory Visit: Payer: Self-pay | Admitting: Nurse Practitioner

## 2015-11-19 DIAGNOSIS — R1083 Colic: Secondary | ICD-10-CM

## 2015-11-19 DIAGNOSIS — K581 Irritable bowel syndrome with constipation: Secondary | ICD-10-CM

## 2015-11-27 ENCOUNTER — Ambulatory Visit
Admission: RE | Admit: 2015-11-27 | Discharge: 2015-11-27 | Disposition: A | Discharge status: Home / Self Care | Payer: BC Managed Care – PPO | Source: Ambulatory Visit | Attending: Nurse Practitioner | Admitting: Nurse Practitioner

## 2015-11-27 DIAGNOSIS — R1084 Generalized abdominal pain: Secondary | ICD-10-CM | POA: Diagnosis present

## 2015-11-27 DIAGNOSIS — K581 Irritable bowel syndrome with constipation: Secondary | ICD-10-CM | POA: Insufficient documentation

## 2015-11-27 DIAGNOSIS — R1083 Colic: Secondary | ICD-10-CM

## 2015-11-27 DIAGNOSIS — D1803 Hemangioma of intra-abdominal structures: Secondary | ICD-10-CM | POA: Diagnosis not present

## 2015-11-27 MED ORDER — IOHEXOL 300 MG/ML  SOLN
100.0000 mL | Freq: Once | INTRAMUSCULAR | Status: AC | PRN
  Administered 2015-11-27: 100 mL via INTRAVENOUS

## 2015-12-01 ENCOUNTER — Ambulatory Visit (INDEPENDENT_AMBULATORY_CARE_PROVIDER_SITE_OTHER): Payer: BC Managed Care – PPO

## 2015-12-01 ENCOUNTER — Ambulatory Visit (INDEPENDENT_AMBULATORY_CARE_PROVIDER_SITE_OTHER): Payer: BC Managed Care – PPO | Admitting: Family Medicine

## 2015-12-01 VITALS — BP 124/70 | HR 53 | Temp 98.1°F | Resp 20 | Ht 64.0 in | Wt 140.8 lb

## 2015-12-01 DIAGNOSIS — S29009A Unspecified injury of muscle and tendon of unspecified wall of thorax, initial encounter: Secondary | ICD-10-CM | POA: Diagnosis not present

## 2015-12-01 DIAGNOSIS — S161XXA Strain of muscle, fascia and tendon at neck level, initial encounter: Secondary | ICD-10-CM

## 2015-12-01 DIAGNOSIS — S29019A Strain of muscle and tendon of unspecified wall of thorax, initial encounter: Secondary | ICD-10-CM

## 2015-12-01 DIAGNOSIS — W010XXA Fall on same level from slipping, tripping and stumbling without subsequent striking against object, initial encounter: Secondary | ICD-10-CM

## 2015-12-01 DIAGNOSIS — T148XXA Other injury of unspecified body region, initial encounter: Secondary | ICD-10-CM

## 2015-12-01 MED ORDER — HYDROCODONE-ACETAMINOPHEN 5-325 MG PO TABS: 1.0000 | ORAL_TABLET | Freq: Four times a day (QID) | ORAL | Status: DC | PRN

## 2015-12-01 MED ORDER — CYCLOBENZAPRINE HCL 10 MG PO TABS: 10.0000 mg | ORAL_TABLET | Freq: Three times a day (TID) | ORAL | Status: DC | PRN

## 2015-12-01 NOTE — Progress Notes (Signed)
Subjective:    Patient ID: Angela Morgan, female    DOB: July 21, 1971, 44 y.o.   MRN: [id number] By signing my name below, I, Zola Button, attest that this documentation has been prepared under the direction and in the presence of Delman Cheadle, MD.  Electronically Signed: Zola Button, Medical Scribe. 12/01/2015. 5:33 PM.  Chief Complaint  Patient presents with  . Generalized Body Aches    fell last saturday  . Flu Vaccine    HPI HPI Comments: Angela Morgan is a 44 y.o. female who presents to the [facility] complaining of a fall that occurred 2 days ago. Patient slipped and fell over a sign in a [employer] bathroom; she was able to catch herself with both of her wrists. She reports having bilateral shoulder pain as well as diffuse pain in her bilateral knees and arms. The pain worsened the morning after the fall. She does have burning pain in her right shoulder, but this does not radiate to her hands. Patient also has bruising to her right knee and bilateral wrists. She has been taking Robaxin, Naprosyn, and ibuprofen for the pain; she has not taken anything other than Naprosyn today. Patient has had multiple right knee surgeries done by Dr. Eddie Dibbles, who is retired now from [employer]. She has also received cortisone injections in her left shoulder due to an injury - seen at [place] for that.  Patient received a flu vaccine when she was here 6 weeks ago.  Past Medical History  Diagnosis Date  . Anxiety   . Depression   . GERD (gastroesophageal reflux disease)   . Migraine, unspecified, without mention of intractable migraine without mention of status migrainosus   . Arthropathy, unspecified, site unspecified   . Allergic rhinitis, cause unspecified   . Situational, disturbance, acute   . Vestibular dysfunction   . Vestibular neuritis   . Insomnia, unspecified   . Symptomatic states associated with artificial menopause   . Urticaria, unspecified   .  Depressive disorder, not elsewhere classified   . Adjustment disorder with depressed mood   . Fibromyalgia   . Postablative ovarian failure   . Irritable bowel syndrome   . Cellulitis 11/2007    Orbital Shingles   Hospitalized  . Allergy    Past Surgical History  Procedure Laterality Date  . Total abdominal hysterectomy      ovaries removed  . Knee surgery      right  . Temporomandibular joint surgery    . Appendectomy    . Cesarean section      x 2  . Cholecystectomy     Current Outpatient Prescriptions on File Prior to Visit  Medication Sig Dispense Refill  . clonazePAM (KLONOPIN) 0.5 MG tablet Take 1 tablet (0.5 mg total) by mouth daily as needed for anxiety. 30 tablet 1  . estradiol (ESTRACE) 1 MG tablet Take 1 mg by mouth daily.    Marland Kitchen ibuprofen (ADVIL,MOTRIN) 600 MG tablet Take 1 tablet (600 mg total) by mouth every 6 (six) hours as needed. 30 tablet 0  . Multiple Vitamin (MULTIVITAMIN) tablet Take 1 tablet by mouth daily.    . NON FORMULARY 1 suppository by Other route every other day. Estrogen/testosterone suppository/ pill every other day.    . Progesterone Micronized (PROGESTERONE PO) Take 75 mg by mouth daily.    Marland Kitchen VITAMIN D, CHOLECALCIFEROL, PO Take by mouth daily.     No current facility-administered medications on file prior to  visit.   Allergies  Allergen Reactions  . Biaxin [Clarithromycin] Anaphylaxis   Family History  Problem Relation Age of Onset  . Diabetes Maternal Grandfather   . Hypertension Mother   . Stroke Mother   . COPD Mother   . Aneurysm Mother     brain  . Transient ischemic attack Mother     multiple  . Hyperlipidemia Mother   . Hypertension Father   . Heart disease Father   . Mental illness Father   . Depression Father   . Hyperlipidemia Father   . Hyperlipidemia Brother   . Hypertension Brother   . Hypertension Brother    Social History   Social History  . Marital Status: Divorced    Spouse Name: n/a  . Number of Children:  2  . Years of Education: college   Occupational History  . 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 Smoking status: Never Smoker   . Smokeless tobacco: Never Used  . Alcohol Use: 2.4 oz/week    4 Glasses of wine per week  . Drug Use: No  . Sexual Activity: Yes    Birth Control/ Protection: Post-menopausal   Other Topics Concern  . None   Social History Narrative   Marital Status:separated in 09/2013 after married x  16+ years. Getting married in [place]:  2 children (44, 17); no grandchildren.       Living:  living in apartment with fiance in [place].  Son is in [place].      Employment:  Working at [employer] sine 08/2013. [place] job.      Exercise: exercising at [facility] per week.      Always uses seat belts / helmet.       Smoke alarm in the home.       No guns in the home.     Caffeine use: moderate      Education: College                                Review of Systems  Constitutional: Positive for activity change. Negative for fever, chills, appetite change and unexpected weight change.  Respiratory: Positive for chest tightness. Negative for cough, choking, shortness of breath and wheezing.   Cardiovascular: Positive for chest pain. Negative for palpitations and leg swelling.  Gastrointestinal: Negative for abdominal pain.  Genitourinary: Negative for flank pain.  Musculoskeletal: Positive for myalgias, back pain, joint swelling, arthralgias, gait problem, neck pain and neck stiffness.  Skin: Positive for color change and wound. Negative for rash.  Neurological: Negative for dizziness, tremors, facial asymmetry, weakness, light-headedness, numbness and headaches.  Hematological: Does not bruise/bleed easily.  Psychiatric/Behavioral: Positive for sleep disturbance.       Objective:  BP 124/70 mmHg  Pulse 53  Temp(Src) 98.1 F (36.7 C) (Oral)  Resp 20  Ht 5\' 4"  (1.626  m)  Wt 140 lb 12.8 oz (63.866 kg)  BMI 24.16 kg/m2  SpO2 99%  Physical Exam  Constitutional: She is oriented to person, place, and time. She appears well-developed and well-nourished. No distress.  HENT:  Head: Normocephalic and atraumatic.  Mouth/Throat: Oropharynx is clear and moist. No oropharyngeal exudate.  Eyes: Pupils are equal, round, and reactive to light.  Neck: Neck supple.  TTP along lower cervical spinous process. Bilateral paraspinal muscle spasms,  left greater than right. Negative Spurling's. Normal cervical flexion, extension, and lateral rotation, but moderate restriction on lateroflexion.    Cardiovascular: Normal rate.   Pulmonary/Chest: Effort normal.  Musculoskeletal: She exhibits no edema.  Pain over left AC joint. Active and passive ROM of both shoulders is limited to 90 degrees flexion, 90 degrees abduction bilaterally.  Neurological: She is alert and oriented to person, place, and time. No cranial nerve deficit.  Skin: Skin is warm and dry. No rash noted.  Bruising to lateral distal right knee. Large bruise on ulnar aspect of the flexor surface of left wrist and right palmar aspect of thenar eminence.  Psychiatric: She has a normal mood and affect. Her behavior is normal.  Nursing note and vitals reviewed.  [facility] (PRIMARY) x-ray report read by Dr. 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 Appears unchanged from prior.  Cervical spine - Good alignment. Disc spaces well-preserved. No acute abnormality. Left wrist - Normal. Right wrist - Normal. Right knee - Normal.     Assessment & Plan:   1. Fall on same level from slipping as cause of accidental injury   2. Cervical strain, acute, initial encounter   3. Bruising   4. Acute thoracic myofascial strain, initial encounter     Fortunately, I did not identify any concerning bony abnormalities on exam. Pt does have a good amount of bruising and soft tissue  pain/spasm from impact though so rec she ice painful joints - shoulder, wrists, knees - and keep ibuprofen on board. Rec heat to central musculature - neck, upper back, gluts, and start flexeril.  Given rx for hydrocodone if having breakthrough pain at night. Rec rest with gentle stretching for x 3-4d, no gym.  Then gradually resume normal activity. Recheck in clinic in 3-4d if still having any focal or localized pain may need more in depth exam at that sight but difficult to assess all in detail today due to large amount of muscular acute pain limiting exam.  May need to cont flexeril qhs for several wks.   Orders Placed This Encounter  Procedures  . DG AC Joints    Standing Status: Future     Number of Occurrences: 1     Standing Expiration Date: 11/30/2016    Order Specific Question:  Reason for Exam (SYMPTOM  OR DIAGNOSIS REQUIRED)    Answer:  fall on outstretched hands, severe tenderness    Order Specific Question:  Is the patient pregnant?    Answer:  No    Order Specific Question:  Preferred imaging location?    Answer:  External  . DG Wrist 2 Views Left    Standing Status: Future     Number of Occurrences: 1     Standing Expiration Date: 11/30/2016    Order Specific Question:  Reason for Exam (SYMPTOM  OR DIAGNOSIS REQUIRED)    Answer:  fall on outstretched hands, severe tenderness    Order Specific Question:  Is the patient pregnant?    Answer:  No    Order Specific Question:  Preferred imaging location?    Answer:  External  . DG Wrist 2 Views Right    Standing Status: Future     Number of Occurrences: 1     Standing Expiration Date: 11/30/2016    Order Specific Question:  Reason for Exam (SYMPTOM  OR DIAGNOSIS REQUIRED)    Answer:  fall on outstretched hands, severe tenderness  Order Specific Question:  Is the patient pregnant?    Answer:  No    Order Specific Question:  Preferred imaging location?    Answer:  External  . DG Knee Complete 4 Views Right    Standing  Status: Future     Number of Occurrences: 1     Standing Expiration Date: 11/30/2016    Order Specific Question:  Reason for Exam (SYMPTOM  OR DIAGNOSIS REQUIRED)    Answer:  fall on outstretched hands, severe tenderness    Order Specific Question:  Is the patient pregnant?    Answer:  No    Order Specific Question:  Preferred imaging location?    Answer:  External  . DG Cervical Spine 2 or 3 views    Standing Status: Future     Number of Occurrences: 1     Standing Expiration Date: 11/30/2016    Order Specific Question:  Reason for Exam (SYMPTOM  OR DIAGNOSIS REQUIRED)    Answer:  fall on outstretched hands, severe tenderness    Order Specific Question:  Is the patient pregnant?    Answer:  No    Order Specific Question:  Preferred imaging location?    Answer:  External    Meds ordered this encounter  Medications  . cyclobenzaprine (FLEXERIL) 10 MG tablet    Sig: Take 1 tablet (10 mg total) by mouth 3 (three) times daily as needed for muscle spasms.    Dispense:  30 tablet    Refill:  1  . HYDROcodone-acetaminophen (NORCO/VICODIN) 5-325 MG tablet    Sig: Take 1 tablet by mouth every 6 (six) hours as needed for moderate pain.    Dispense:  20 tablet    Refill:  0    I personally performed the services described in this documentation, which was scribed in my presence. The recorded information has been reviewed and considered, and addended by me as needed.  Delman Cheadle, MD MPH

## 2015-12-01 NOTE — Patient Instructions (Addendum)
I recommend keeping ibuprofen on board - esp taking during the day. Take a muscle relaxant and then after 30 minutes or so apply 15 minutes of heat to neck and upper back followed by very gentle slow stretching. Try to do this regiment 2 - 3 times a day. Recheck in 3-4 days to ensure you are heading in the right direction and that there is no concern for additional underlying bony injuries. If you are still having pain in 4 to 6 wks, come back to clinic for further eval. RTC immed if symptoms worsen or you develop any other concerning symptoms such as numbness, tingling, or weakness in any extremity or change in bowel/bladder function.   Cervical Strain and Sprain With Rehab Cervical strain and sprain are injuries that commonly occur with "whiplash" injuries. Whiplash occurs when the neck is forcefully whipped backward or forward, such as during a motor vehicle accident or during contact sports. The muscles, ligaments, tendons, discs, and nerves of the neck are susceptible to injury when this occurs. RISK FACTORS Risk of having a whiplash injury increases if:  Osteoarthritis of the spine.  Situations that make head or neck accidents or trauma more likely.  High-risk sports (football, rugby, wrestling, hockey, auto racing, gymnastics, diving, contact karate, or boxing).  Poor strength and flexibility of the neck.  Previous neck injury.  Poor tackling technique.  Improperly fitted or padded equipment. SYMPTOMS   Pain or stiffness in the front or back of neck or both.  Symptoms may present immediately or up to 24 hours after injury.  Dizziness, headache, nausea, and vomiting.  Muscle spasm with soreness and stiffness in the neck.  Tenderness and swelling at the injury site. PREVENTION  Learn and use proper technique (avoid tackling with the head, spearing, and head-butting; use proper falling techniques to avoid landing on the head).  Warm up and stretch properly before  activity.  Maintain physical fitness:  Strength, flexibility, and endurance.  Cardiovascular fitness.  Wear properly fitted and padded protective equipment, such as padded soft collars, for participation in contact sports. PROGNOSIS  Recovery from cervical strain and sprain injuries is dependent on the extent of the injury. These injuries are usually curable in 1 week to 3 months with appropriate treatment.  RELATED COMPLICATIONS   Temporary numbness and weakness may occur if the nerve roots are damaged, and this may persist until the nerve has completely healed.  Chronic pain due to frequent recurrence of symptoms.  Prolonged healing, especially if activity is resumed too soon (before complete recovery). TREATMENT  Treatment initially involves the use of ice and medication to help reduce pain and inflammation. It is also important to perform strengthening and stretching exercises and modify activities that worsen symptoms so the injury does not get worse. These exercises may be performed at home or with a therapist. For patients who experience severe symptoms, a soft, padded collar may be recommended to be worn around the neck.  Improving your posture may help reduce symptoms. Posture improvement includes pulling your chin and abdomen in while sitting or standing. If you are sitting, sit in a firm chair with your buttocks against the back of the chair. While sleeping, try replacing your pillow with a small towel rolled to 2 inches in diameter, or use a cervical pillow or soft cervical collar. Poor sleeping positions delay healing.  For patients with nerve root damage, which causes numbness or weakness, the use of a cervical traction apparatus may be recommended. Surgery is rarely  necessary for these injuries. However, cervical strain and sprains that are present at birth (congenital) may require surgery. MEDICATION   If pain medication is necessary, nonsteroidal anti-inflammatory  medications, such as aspirin and ibuprofen, or other minor pain relievers, such as acetaminophen, are often recommended.  Do not take pain medication for 7 days before surgery.  Prescription pain relievers may be given if deemed necessary by your caregiver. Use only as directed and only as much as you need. HEAT AND COLD:   Cold treatment (icing) relieves pain and reduces inflammation. Cold treatment should be applied for 10 to 15 minutes every 2 to 3 hours for inflammation and pain and immediately after any activity that aggravates your symptoms. Use ice packs or an ice massage.  Heat treatment may be used prior to performing the stretching and strengthening activities prescribed by your caregiver, physical therapist, or athletic trainer. Use a heat pack or a warm soak. SEEK MEDICAL CARE IF:   Symptoms get worse or do not improve in 2 weeks despite treatment.  New, unexplained symptoms develop (drugs used in treatment may produce side effects). EXERCISES RANGE OF MOTION (ROM) AND STRETCHING EXERCISES - Cervical Strain and Sprain These exercises may help you when beginning to rehabilitate your injury. In order to successfully resolve your symptoms, you must improve your posture. These exercises are designed to help reduce the forward-head and rounded-shoulder posture which contributes to this condition. Your symptoms may resolve with or without further involvement from your physician, physical therapist or athletic trainer. While completing these exercises, remember:   Restoring tissue flexibility helps normal motion to return to the joints. This allows healthier, less painful movement and activity.  An effective stretch should be held for at least 20 seconds, although you may need to begin with shorter hold times for comfort.  A stretch should never be painful. You should only feel a gentle lengthening or release in the stretched tissue. STRETCH- Axial Extensors  Lie on your back on the  floor. You may bend your knees for comfort. Place a rolled-up hand towel or dish towel, about 2 inches in diameter, under the part of your head that makes contact with the floor.  Gently tuck your chin, as if trying to make a "double chin," until you feel a gentle stretch at the base of your head.  Hold __________ seconds. Repeat __________ times. Complete this exercise __________ times per day.  STRETCH - Axial Extension   Stand or sit on a firm surface. Assume a good posture: chest up, shoulders drawn back, abdominal muscles slightly tense, knees unlocked (if standing) and feet hip width apart.  Slowly retract your chin so your head slides back and your chin slightly lowers. Continue to look straight ahead.  You should feel a gentle stretch in the back of your head. Be certain not to feel an aggressive stretch since this can cause headaches later.  Hold for __________ seconds. Repeat __________ times. Complete this exercise __________ times per day. STRETCH - Cervical Side Bend   Stand or sit on a firm surface. Assume a good posture: chest up, shoulders drawn back, abdominal muscles slightly tense, knees unlocked (if standing) and feet hip width apart.  Without letting your nose or shoulders move, slowly tip your right / left ear to your shoulder until your feel a gentle stretch in the muscles on the opposite side of your neck.  Hold __________ seconds. Repeat __________ times. Complete this exercise __________ times per day. STRETCH - Cervical Rotators  Stand or sit on a firm surface. Assume a good posture: chest up, shoulders drawn back, abdominal muscles slightly tense, knees unlocked (if standing) and feet hip width apart.  Keeping your eyes level with the ground, slowly turn your head until you feel a gentle stretch along the back and opposite side of your neck.  Hold __________ seconds. Repeat __________ times. Complete this exercise __________ times per day. RANGE OF MOTION  - Neck Circles   Stand or sit on a firm surface. Assume a good posture: chest up, shoulders drawn back, abdominal muscles slightly tense, knees unlocked (if standing) and feet hip width apart.  Gently roll your head down and around from the back of one shoulder to the back of the other. The motion should never be forced or painful.  Repeat the motion 10-20 times, or until you feel the neck muscles relax and loosen. Repeat __________ times. Complete the exercise __________ times per day. STRENGTHENING EXERCISES - Cervical Strain and Sprain These exercises may help you when beginning to rehabilitate your injury. They may resolve your symptoms with or without further involvement from your physician, physical therapist, or athletic trainer. While completing these exercises, remember:   Muscles can gain both the endurance and the strength needed for everyday activities through controlled exercises.  Complete these exercises as instructed by your physician, physical therapist, or athletic trainer. Progress the resistance and repetitions only as guided.  You may experience muscle soreness or fatigue, but the pain or discomfort you are trying to eliminate should never worsen during these exercises. If this pain does worsen, stop and make certain you are following the directions exactly. If the pain is still present after adjustments, discontinue the exercise until you can discuss the trouble with your clinician. STRENGTH - Cervical Flexors, Isometric  Face a wall, standing about 6 inches away. Place a small pillow, a ball about 6-8 inches in diameter, or a folded towel between your forehead and the wall.  Slightly tuck your chin and gently push your forehead into the soft object. Push only with mild to moderate intensity, building up tension gradually. Keep your jaw and forehead relaxed.  Hold 10 to 20 seconds. Keep your breathing relaxed.  Release the tension slowly. Relax your neck muscles  completely before you start the next repetition. Repeat __________ times. Complete this exercise __________ times per day. STRENGTH- Cervical Lateral Flexors, Isometric   Stand about 6 inches away from a wall. Place a small pillow, a ball about 6-8 inches in diameter, or a folded towel between the side of your head and the wall.  Slightly tuck your chin and gently tilt your head into the soft object. Push only with mild to moderate intensity, building up tension gradually. Keep your jaw and forehead relaxed.  Hold 10 to 20 seconds. Keep your breathing relaxed.  Release the tension slowly. Relax your neck muscles completely before you start the next repetition. Repeat __________ times. Complete this exercise __________ times per day. STRENGTH - Cervical Extensors, Isometric   Stand about 6 inches away from a wall. Place a small pillow, a ball about 6-8 inches in diameter, or a folded towel between the back of your head and the wall.  Slightly tuck your chin and gently tilt your head back into the soft object. Push only with mild to moderate intensity, building up tension gradually. Keep your jaw and forehead relaxed.  Hold 10 to 20 seconds. Keep your breathing relaxed.  Release the tension slowly. Relax your neck  muscles completely before you start the next repetition. Repeat __________ times. Complete this exercise __________ times per day. POSTURE AND BODY MECHANICS CONSIDERATIONS - Cervical Strain and Sprain Keeping correct posture when sitting, standing or completing your activities will reduce the stress put on different body tissues, allowing injured tissues a chance to heal and limiting painful experiences. The following are general guidelines for improved posture. Your physician or physical therapist will provide you with any instructions specific to your needs. While reading these guidelines, remember:  The exercises prescribed by your provider will help you have the flexibility and  strength to maintain correct postures.  The correct posture provides the optimal environment for your joints to work. All of your joints have less wear and tear when properly supported by a spine with good posture. This means you will experience a healthier, less painful body.  Correct posture must be practiced with all of your activities, especially prolonged sitting and standing. Correct posture is as important when doing repetitive low-stress activities (typing) as it is when doing a single heavy-load activity (lifting). PROLONGED STANDING WHILE SLIGHTLY LEANING FORWARD When completing a task that requires you to lean forward while standing in one place for a long time, place either foot up on a stationary 2- to 4-inch high object to help maintain the best posture. When both feet are on the ground, the low back tends to lose its slight inward curve. If this curve flattens (or becomes too large), then the back and your other joints will experience too much stress, fatigue more quickly, and can cause pain.  RESTING POSITIONS Consider which positions are most painful for you when choosing a resting position. If you have pain with flexion-based activities (sitting, bending, stooping, squatting), choose a position that allows you to rest in a less flexed posture. You would want to avoid curling into a fetal position on your side. If your pain worsens with extension-based activities (prolonged standing, working overhead), avoid resting in an extended position such as sleeping on your stomach. Most people will find more comfort when they rest with their spine in a more neutral position, neither too rounded nor too arched. Lying on a non-sagging bed on your side with a pillow between your knees, or on your back with a pillow under your knees will often provide some relief. Keep in mind, being in any one position for a prolonged period of time, no matter how correct your posture, can still lead to  stiffness. WALKING Walk with an upright posture. Your ears, shoulders, and hips should all line up. OFFICE WORK When working at a desk, create an environment that supports good, upright posture. Without extra support, muscles fatigue and lead to excessive strain on joints and other tissues. CHAIR:  A chair should be able to slide under your desk when your back makes contact with the back of the chair. This allows you to work closely.  The chair's height should allow your eyes to be level with the upper part of your monitor and your hands to be slightly lower than your elbows.  Body position:  Your feet should make contact with the floor. If this is not possible, use a foot rest.  Keep your ears over your shoulders. This will reduce stress on your neck and low back.   This information is not intended to replace advice given to you by your health care provider. Make sure you discuss any questions you have with your health care provider.   Document Released:  11/22/2005 Document Revised: 12/13/2014 Document Reviewed: 03/06/2009 Elsevier Interactive Patient Education [employer].

## 2016-01-09 ENCOUNTER — Encounter: Payer: Self-pay | Admitting: *Deleted

## 2016-01-12 ENCOUNTER — Ambulatory Visit
Admission: RE | Admit: 2016-01-12 | Discharge: 2016-01-12 | Disposition: A | Discharge status: Home / Self Care | Payer: BC Managed Care – PPO | Source: Ambulatory Visit | Attending: Gastroenterology | Admitting: Gastroenterology

## 2016-01-12 ENCOUNTER — Ambulatory Visit: Payer: BC Managed Care – PPO | Admitting: Anesthesiology

## 2016-01-12 ENCOUNTER — Encounter: Payer: BC Managed Care – PPO | Admitting: Family Medicine

## 2016-01-12 ENCOUNTER — Encounter: Payer: Self-pay | Admitting: *Deleted

## 2016-01-12 ENCOUNTER — Encounter
Admission: RE | Disposition: A | Discharge status: Home / Self Care | Payer: Self-pay | Source: Ambulatory Visit | Attending: Gastroenterology

## 2016-01-12 DIAGNOSIS — K589 Irritable bowel syndrome without diarrhea: Secondary | ICD-10-CM | POA: Insufficient documentation

## 2016-01-12 DIAGNOSIS — F419 Anxiety disorder, unspecified: Secondary | ICD-10-CM | POA: Insufficient documentation

## 2016-01-12 DIAGNOSIS — K219 Gastro-esophageal reflux disease without esophagitis: Secondary | ICD-10-CM | POA: Insufficient documentation

## 2016-01-12 DIAGNOSIS — F329 Major depressive disorder, single episode, unspecified: Secondary | ICD-10-CM | POA: Diagnosis not present

## 2016-01-12 DIAGNOSIS — Z79899 Other long term (current) drug therapy: Secondary | ICD-10-CM | POA: Insufficient documentation

## 2016-01-12 DIAGNOSIS — M797 Fibromyalgia: Secondary | ICD-10-CM | POA: Diagnosis not present

## 2016-01-12 DIAGNOSIS — K210 Gastro-esophageal reflux disease with esophagitis: Secondary | ICD-10-CM | POA: Diagnosis not present

## 2016-01-12 DIAGNOSIS — M129 Arthropathy, unspecified: Secondary | ICD-10-CM | POA: Diagnosis not present

## 2016-01-12 DIAGNOSIS — G4700 Insomnia, unspecified: Secondary | ICD-10-CM | POA: Insufficient documentation

## 2016-01-12 HISTORY — PX: ESOPHAGOGASTRODUODENOSCOPY (EGD) WITH PROPOFOL: SHX5813

## 2016-01-12 SURGERY — ESOPHAGOGASTRODUODENOSCOPY (EGD) WITH PROPOFOL
Anesthesia: General

## 2016-01-12 MED ORDER — LIDOCAINE HCL (CARDIAC) 20 MG/ML IV SOLN
INTRAVENOUS | Status: DC | PRN
Start: 2016-01-12 — End: 2016-01-12
  Administered 2016-01-12: 100 mg via INTRAVENOUS

## 2016-01-12 MED ORDER — SODIUM CHLORIDE 0.9 % IV SOLN: INTRAVENOUS | Status: DC

## 2016-01-12 MED ORDER — SODIUM CHLORIDE 0.9 % IV SOLN
INTRAVENOUS | Status: DC
  Administered 2016-01-12: 14:00:00 via INTRAVENOUS

## 2016-01-12 MED ORDER — PROPOFOL 10 MG/ML IV BOLUS
INTRAVENOUS | Status: DC | PRN
  Administered 2016-01-12: 50 mg via INTRAVENOUS
  Administered 2016-01-12: 80 mg via INTRAVENOUS

## 2016-01-12 MED ORDER — PROPOFOL 500 MG/50ML IV EMUL
INTRAVENOUS | Status: DC | PRN
  Administered 2016-01-12: 175 ug/kg/min via INTRAVENOUS

## 2016-01-12 MED ORDER — GLYCOPYRROLATE 0.2 MG/ML IJ SOLN
INTRAMUSCULAR | Status: DC | PRN
  Administered 2016-01-12: 0.2 mg via INTRAVENOUS

## 2016-01-12 NOTE — Transfer of Care (Signed)
Immediate Anesthesia Transfer of Care Note  Patient: Angela Morgan  Procedure(s) Performed: Procedure(s): ESOPHAGOGASTRODUODENOSCOPY (EGD) WITH PROPOFOL (N/A)  Patient Location: Endoscopy Unit  Anesthesia Type:General  Level of Consciousness: sedated  Airway & Oxygen Therapy: Patient Spontanous Breathing and Patient connected to nasal cannula oxygen  Post-op Assessment: Report given to RN and Post -op Vital signs reviewed and stable  Post vital signs: Reviewed and stable  Last Vitals:  Filed Vitals:   01/12/16 1341  BP: 128/74  Pulse: 62  Temp: 36.5 C  Resp: 12    Complications: No apparent anesthesia complications

## 2016-01-12 NOTE — Op Note (Signed)
[name] Patient Name: Angela Morgan Procedure Date: 01/12/2016 2:11 PM MRN: [id number] Account #: [id number] Date of Birth: 03-Apr-1971 Admit Type: Outpatient Age: 45 Room: [facility] ENDO ROOM 4 Gender: Female Note Status: Finalized Procedure:         Upper GI endoscopy Indications:       Suspected esophageal reflux Providers:         Lupita Dawn. Candace Cruise, MD Referring MD:      Renette Butters. Tamala Julian, MD (Referring MD) Medicines:         Monitored Anesthesia Care Complications:     No immediate complications. Procedure:         Pre-Anesthesia Assessment:                    - Prior to the procedure, a History and Physical was                     performed, and patient medications, allergies and                     sensitivities were reviewed. The patient's tolerance of                     previous anesthesia was reviewed.                    - The risks and benefits of the procedure and the sedation                     options and risks were discussed with the patient. All                     questions were answered and informed consent was obtained.                    - After reviewing the risks and benefits, the patient was                     deemed in satisfactory condition to undergo the procedure.                    After obtaining informed consent, the endoscope was passed                     under direct vision. Throughout the procedure, the                     patient's blood pressure, pulse, and oxygen saturations                     were monitored continuously. The Endoscope was introduced                     through the mouth, and advanced to the second part of                     duodenum. The upper GI endoscopy was accomplished without                     difficulty. The patient tolerated the procedure well. Findings:      LA Grade A (one or more mucosal breaks less than 5 mm, not extending       between tops of 2 mucosal folds) esophagitis was found  at the  gastroesophageal junction. Biopsies were taken with a cold forceps for       histology.      The exam was otherwise without abnormality.      The entire examined stomach was normal.      The examined duodenum was normal. Impression:        - LA Grade A reflux esophagitis. Biopsied.                    - The examination was otherwise normal.                    - Normal stomach.                    - Normal examined duodenum. Recommendation:    - Discharge patient to home.                    - Observe patient's clinical course.                    - Await pathology results.                    - The findings and recommendations were discussed with the                     patient. Procedure Code(s): --- Professional ---                    [phone], Esophagogastroduodenoscopy, flexible, transoral;                     with biopsy, single or multiple Diagnosis Code(s): --- Professional ---                    K21.0, Gastro-esophageal reflux disease with esophagitis CPT copyright 2014 American Medical Association. All rights reserved. The codes documented in this report are preliminary and upon coder review may  be revised to meet current compliance requirements. Hulen Luster, MD 01/12/2016 2:22:32 PM This report has been signed electronically. Number of Addenda: 0 Note Initiated On: 01/12/2016 2:11 PM      [facility]

## 2016-01-12 NOTE — H&P (Signed)
Primary Care Physician:  [name], MD Primary Gastroenterologist:  Dr. Candace Cruise  Pre-Procedure History & Physical: HPI:  Angela Morgan is a 45 y.o. female is here for an EGD   Past Medical History  Diagnosis Date  . Anxiety   . Depression   . GERD (gastroesophageal reflux disease)   . Migraine, unspecified, without mention of intractable migraine without mention of status migrainosus   . Arthropathy, unspecified, site unspecified   . Allergic rhinitis, cause unspecified   . Situational, disturbance, acute   . Vestibular dysfunction   . Vestibular neuritis   . Insomnia, unspecified   . Symptomatic states associated with artificial menopause   . Urticaria, unspecified   . Depressive disorder, not elsewhere classified   . Adjustment disorder with depressed mood   . Fibromyalgia   . Postablative ovarian failure   . Irritable bowel syndrome   . Cellulitis 11/2007    Orbital Shingles   Hospitalized  . Allergy     Past Surgical History  Procedure Laterality Date  . Total abdominal hysterectomy      ovaries removed  . Knee surgery      right  . Temporomandibular joint surgery    . Appendectomy    . Cesarean section      x 2  . Cholecystectomy      Prior to Admission medications   Medication Sig Start Date End Date Taking? Authorizing Provider  clonazePAM (KLONOPIN) 0.5 MG tablet Take 1 tablet (0.5 mg total) by mouth daily as needed for anxiety. 03/13/15  Yes Wardell Honour, MD  cyclobenzaprine (FLEXERIL) 10 MG tablet Take 1 tablet (10 mg total) by mouth 3 (three) times daily as needed for muscle spasms. 12/01/15  Yes Shawnee Knapp, MD  dexlansoprazole (DEXILANT) 60 MG capsule Take 60 mg by mouth daily.   Yes Historical Provider, MD  estradiol (ESTRACE) 1 MG tablet Take 1 mg by mouth daily.   Yes Historical Provider, MD  ibuprofen (ADVIL,MOTRIN) 600 MG tablet Take 1 tablet (600 mg total) by mouth every 6 (six) hours as needed. 09/08/14  Yes Junius Creamer, NP  Multiple  Vitamin (MULTIVITAMIN) tablet Take 1 tablet by mouth daily.   Yes Historical Provider, MD  NON FORMULARY 1 suppository by Other route every other day. Estrogen/testosterone suppository/ pill every other day.   Yes Historical Provider, MD  Progesterone Micronized (PROGESTERONE PO) Take 75 mg by mouth daily.   Yes Historical Provider, MD  ranitidine (ZANTAC) 150 MG tablet Take 150 mg by mouth 2 (two) times daily.   Yes Historical Provider, MD  VITAMIN D, CHOLECALCIFEROL, PO Take by mouth daily.   Yes Historical Provider, MD  escitalopram (LEXAPRO) 20 MG tablet Take 20 mg by mouth daily. Reported on 01/12/2016    Historical Provider, MD  HYDROcodone-acetaminophen (NORCO/VICODIN) 5-325 MG tablet Take 1 tablet by mouth every 6 (six) hours as needed for moderate pain. 12/01/15   Shawnee Knapp, MD    Allergies as of 01/08/2016 - Review Complete 12/01/2015  Allergen Reaction Noted  . Biaxin [clarithromycin] Anaphylaxis 07/31/2012    Family History  Problem Relation Age of Onset  . Diabetes Maternal Grandfather   . Hypertension Mother   . Stroke Mother   . COPD Mother   . Aneurysm Mother     brain  . Transient ischemic attack Mother     multiple  . Hyperlipidemia Mother   . Hypertension Father   . Heart disease Father   . Mental illness Father   .  Depression Father   . Hyperlipidemia Father   . Hyperlipidemia Brother   . Hypertension Brother   . Hypertension Brother     Social History   Social History  . Marital Status: Married    Spouse Name: n/a  . Number of Children: 2  . Years of Education: college   Occupational History  . 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 Smoking status: Never Smoker   . Smokeless tobacco: Never Used  . Alcohol Use: 2.4 oz/week    4 Glasses of wine per week  . Drug Use: No  . Sexual Activity: Yes    Birth Control/ Protection: Post-menopausal   Other Topics Concern  . Not on file   Social History  Narrative   Marital Status:separated in 09/2013 after married x  16+ years. Getting married in [place]:  2 children (27, 17); no grandchildren.       Living:  living in apartment with fiance in [place].  Son is in [place].      Employment:  Working at [employer] sine 08/2013. [place] job.      Exercise: exercising at [facility] per week.      Always uses seat belts / helmet.       Smoke alarm in the home.       No guns in the home.     Caffeine use: moderate      Education: College                               Review of Systems: See HPI, otherwise negative ROS  Physical Exam: BP 128/74 mmHg  Pulse 62  Temp(Src) 97.7 F (36.5 C) (Tympanic)  Resp 12  Ht 5\' 4"  (1.626 m)  Wt 65.318 kg (144 lb)  BMI 24.71 kg/m2  SpO2 98% General:   Alert,  pleasant and cooperative in NAD Head:  Normocephalic and atraumatic. Neck:  Supple; no masses or thyromegaly. Lungs:  Clear throughout to auscultation.    Heart:  Regular rate and rhythm. Abdomen:  Soft, nontender and nondistended. Normal bowel sounds, without guarding, and without rebound.   Neurologic:  Alert and  oriented x4;  grossly normal neurologically.  Impression/Plan: Angela Morgan is here for an EGD to be performed for hx of PUD  Risks, benefits, limitations, and alternatives regarding EGD have been reviewed with the patient.  Questions have been answered.  All parties agreeable.   Haseeb Fiallos, Lupita Dawn, MD  01/12/2016, 1:59 PM

## 2016-01-12 NOTE — H&P (Signed)
  Date of Initial H&P: 12/26/2015  History reviewed, patient examined, no change in status, stable for surgery.

## 2016-01-12 NOTE — Anesthesia Postprocedure Evaluation (Signed)
Anesthesia Post Note  Patient: Angela Morgan  Procedure(s) Performed: Procedure(s) (LRB): ESOPHAGOGASTRODUODENOSCOPY (EGD) WITH PROPOFOL (N/A)  Patient location during evaluation: Endoscopy Anesthesia Type: General Level of consciousness: awake and alert Pain management: pain level controlled Vital Signs Assessment: post-procedure vital signs reviewed and stable Respiratory status: spontaneous breathing, nonlabored ventilation, respiratory function stable and patient connected to nasal cannula oxygen Cardiovascular status: blood pressure returned to baseline and stable Postop Assessment: no signs of nausea or vomiting Anesthetic complications: no    Last Vitals:  Filed Vitals:   01/12/16 1445 01/12/16 1455  BP: 112/82 113/79  Pulse: 73 71  Temp:    Resp: 14 15    Last Pain:  Filed Vitals:   01/12/16 1456  PainSc: 4                  Martha Clan

## 2016-01-12 NOTE — Anesthesia Preprocedure Evaluation (Signed)
Anesthesia Evaluation  Patient identified by MRN, date of birth, ID band Patient awake    Reviewed: Allergy & Precautions, H&P , NPO status , Patient's Chart, lab work & pertinent test results, reviewed documented beta blocker date and time   History of Anesthesia Complications Negative for: history of anesthetic complications  Airway Mallampati: II  TM Distance: >3 FB Neck ROM: full    Dental no notable dental hx. (+) Teeth Intact   Pulmonary neg pulmonary ROS,    Pulmonary exam normal breath sounds clear to auscultation       Cardiovascular Exercise Tolerance: Good negative cardio ROS Normal cardiovascular exam Rhythm:regular Rate:Normal     Neuro/Psych neg Seizures PSYCHIATRIC DISORDERS (Depression)  Neuromuscular disease (Vestibular neuritis)    GI/Hepatic Neg liver ROS, GERD  Medicated,  Endo/Other  negative endocrine ROS  Renal/GU negative Renal ROS  negative genitourinary   Musculoskeletal   Abdominal   Peds  Hematology negative hematology ROS (+)   Anesthesia Other Findings Past Medical History:   Anxiety                                                      Depression                                                   GERD (gastroesophageal reflux disease)                       Migraine, unspecified, without mention of intr*              Arthropathy, unspecified, site unspecified                   Allergic rhinitis, cause unspecified                         Situational, disturbance, acute                              Vestibular dysfunction                                       Vestibular neuritis                                          Insomnia, unspecified                                        Symptomatic states associated with artificial *              Urticaria, unspecified                                       Depressive disorder, not elsewhere classified  Adjustment disorder with  depressed mood                      Fibromyalgia                                                 Postablative ovarian failure                                 Irritable bowel syndrome                                     Cellulitis                                      11/2007        Comment:Orbital Shingles   Hospitalized   Allergy                                                      Reproductive/Obstetrics negative OB ROS                             Anesthesia Physical Anesthesia Plan  ASA: II  Anesthesia Plan: General   Post-op Pain Management:    Induction:   Airway Management Planned:   Additional Equipment:   Intra-op Plan:   Post-operative Plan:   Informed Consent: I have reviewed the patients History and Physical, chart, labs and discussed the procedure including the risks, benefits and alternatives for the proposed anesthesia with the patient or authorized representative who has indicated his/her understanding and acceptance.   Dental Advisory Given  Plan Discussed with: Anesthesiologist, CRNA and Surgeon  Anesthesia Plan Comments:         Anesthesia Quick Evaluation

## 2016-01-13 ENCOUNTER — Encounter: Payer: Self-pay | Admitting: Gastroenterology

## 2016-01-14 LAB — SURGICAL PATHOLOGY

## 2016-01-20 ENCOUNTER — Encounter: Payer: BC Managed Care – PPO | Admitting: Family Medicine

## 2016-02-08 IMAGING — US US BREAST*L* LIMITED INC AXILLA
1 series · 7 of 7 positions shown · non-contrast
Comparison: [facility]

CLINICAL DATA: Patient presents for evaluation of palpable
abnormality within the 12 o'clock position left breast. Patient
states the palpable abnormality has been present since 07/06/2014.

EXAM:
DIGITAL DIAGNOSTIC  LEFT MAMMOGRAM WITH CAD
ULTRASOUND LEFT BREAST

[Series 1: us breast*left* limited inc axilla · 0.08mm/px · 7 of 7 slices shown]
[im 1/7]
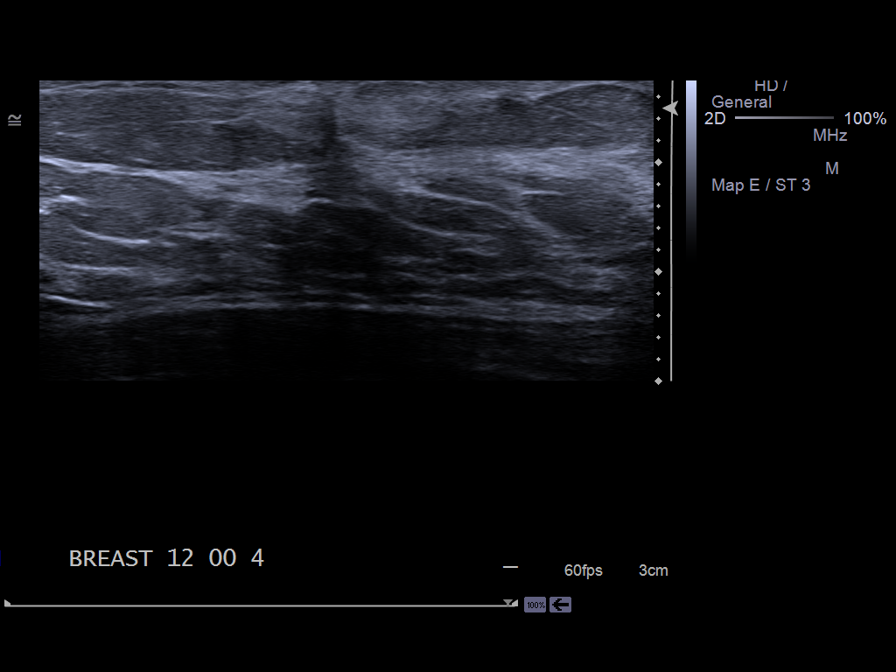
[im 2/7]
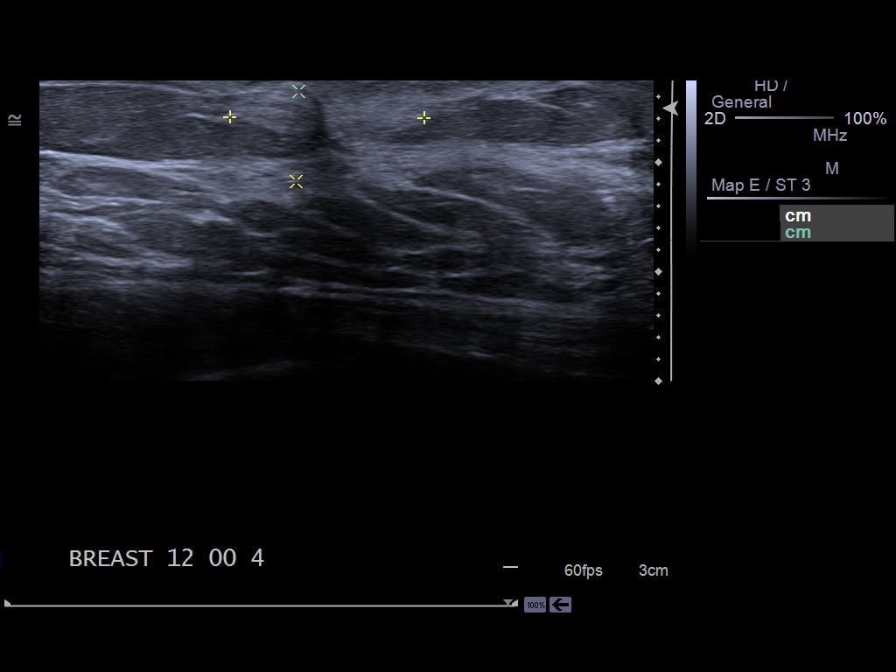
[im 3/7]
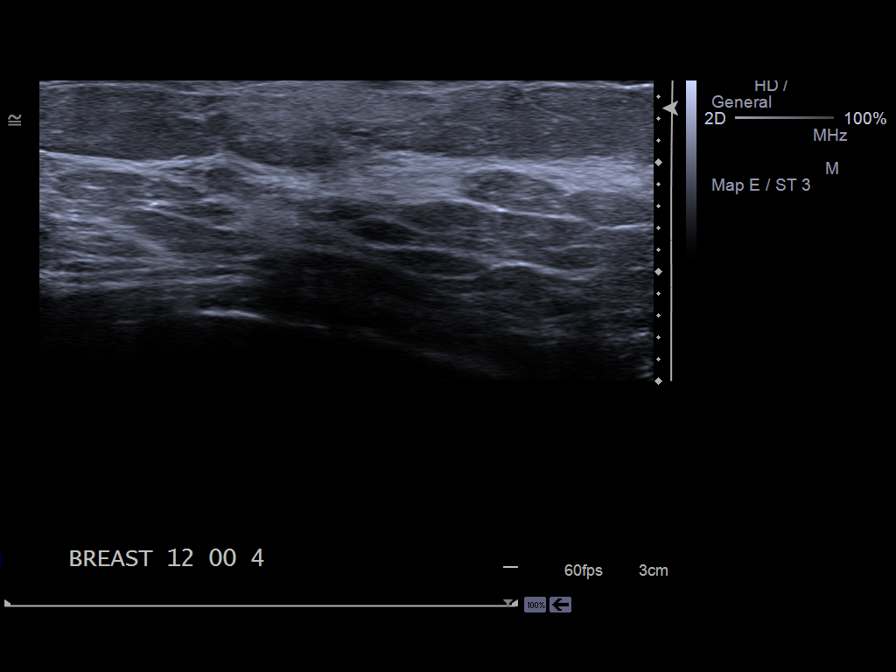
[im 4/7]
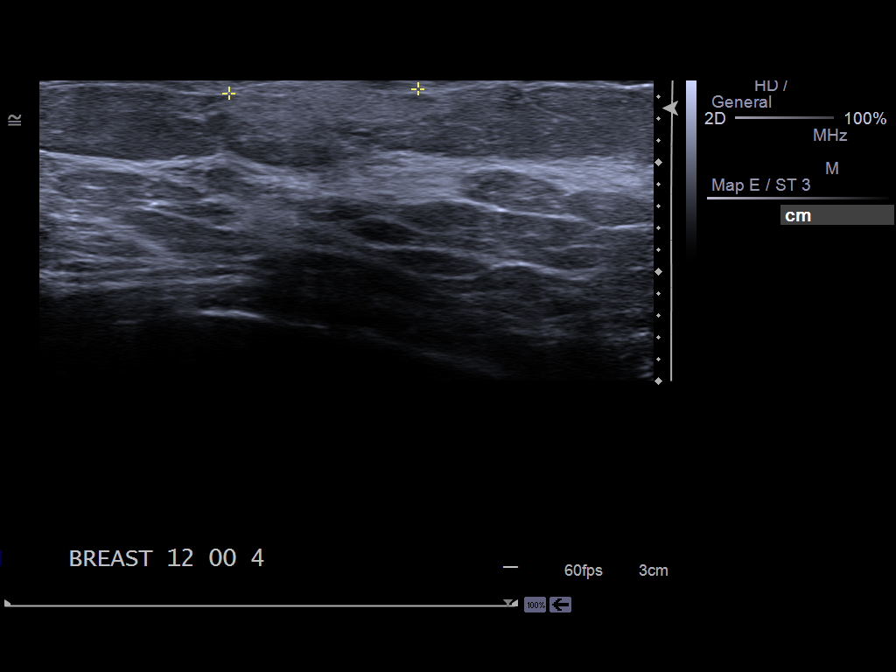
[im 5/7]
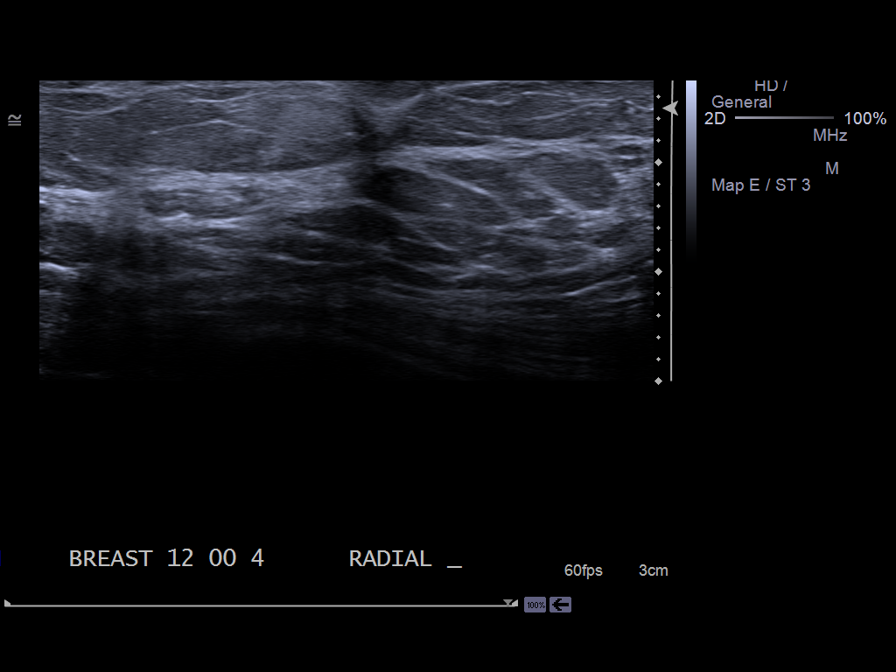
[im 6/7]
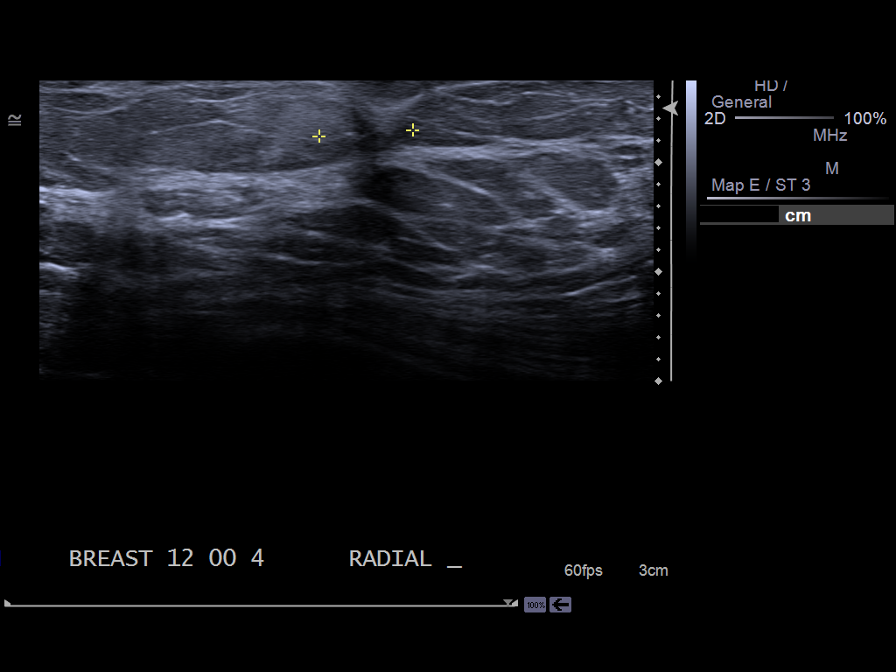
[im 7/7]
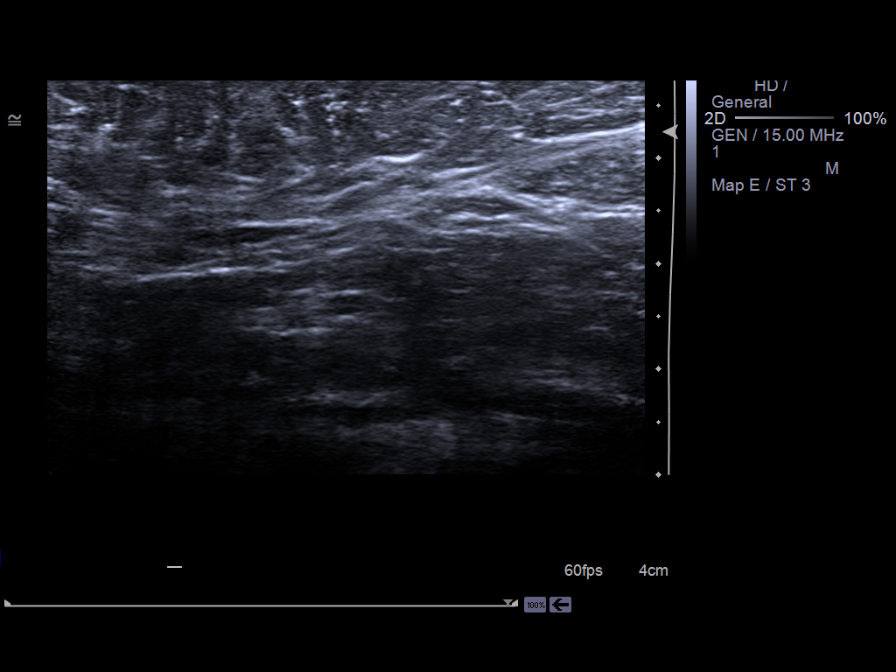

[7 of 7 positions shown; findings below may reference images not displayed]

ACR Breast Density Category b: There are scattered areas of
fibroglandular density.
FINDINGS: No concerning masses, calcifications or architectural distortion
identified within the left breast. No concerning mass underlying the
palpable marker on the spot tangential view within the left breast.

Mammographic images were processed with CAD.

On physical exam, I palpate a discrete for mass within the 12
o'clock position left breast middle depth.

Ultrasound is performed, showing a discrete area of mixed
echogenicity within the 12 o'clock position left breast 4 cm from
the nipple measuring 18 x 8 x 9 mm. There is a small focal area of
associated vague posterior acoustic shadowing.
IMPRESSION: Palpable abnormality corresponds with a mixed echogenicity area on
ultrasound. While this may potentially represent an area of
fibroglandular tissue or fat necrosis, given the palpable nature of
the finding, ultrasound-guided core needle biopsy is recommended.

RECOMMENDATION:
Ultrasound-guided core needle biopsy palpable area left breast.

This will be scheduled at the patient's convenience.

I have discussed the findings and recommendations with the patient.
Results were also provided in writing at the conclusion of the
visit. If applicable, a reminder letter will be sent to the patient
regarding the next appointment.

BI-RADS CATEGORY  4: Suspicious.

## 2016-03-02 ENCOUNTER — Ambulatory Visit (INDEPENDENT_AMBULATORY_CARE_PROVIDER_SITE_OTHER): Payer: BC Managed Care – PPO | Admitting: Family Medicine

## 2016-03-02 ENCOUNTER — Encounter: Payer: Self-pay | Admitting: Family Medicine

## 2016-03-02 VITALS — BP 120/82 | HR 77 | Temp 99.2°F | Resp 16 | Ht 64.25 in | Wt 142.0 lb

## 2016-03-02 DIAGNOSIS — Z1329 Encounter for screening for other suspected endocrine disorder: Secondary | ICD-10-CM | POA: Diagnosis not present

## 2016-03-02 DIAGNOSIS — Z79899 Other long term (current) drug therapy: Secondary | ICD-10-CM

## 2016-03-02 DIAGNOSIS — N898 Other specified noninflammatory disorders of vagina: Secondary | ICD-10-CM

## 2016-03-02 DIAGNOSIS — Z131 Encounter for screening for diabetes mellitus: Secondary | ICD-10-CM | POA: Diagnosis not present

## 2016-03-02 DIAGNOSIS — E28319 Asymptomatic premature menopause: Secondary | ICD-10-CM | POA: Diagnosis not present

## 2016-03-02 DIAGNOSIS — Z0000 Encounter for general adult medical examination without abnormal findings: Secondary | ICD-10-CM

## 2016-03-02 DIAGNOSIS — Z13220 Encounter for screening for lipoid disorders: Secondary | ICD-10-CM

## 2016-03-02 DIAGNOSIS — Z1231 Encounter for screening mammogram for malignant neoplasm of breast: Secondary | ICD-10-CM

## 2016-03-02 DIAGNOSIS — Z79890 Hormone replacement therapy (postmenopausal): Secondary | ICD-10-CM

## 2016-03-02 DIAGNOSIS — Z5181 Encounter for therapeutic drug level monitoring: Secondary | ICD-10-CM

## 2016-03-02 DIAGNOSIS — H8123 Vestibular neuronitis, bilateral: Secondary | ICD-10-CM

## 2016-03-02 LAB — POCT WET + KOH PREP
Trich by wet prep: ABSENT
Yeast by KOH: ABSENT
Yeast by wet prep: ABSENT

## 2016-03-02 LAB — POCT URINALYSIS DIP (MANUAL ENTRY)
BILIRUBIN UA: NEGATIVE
Bilirubin, UA: NEGATIVE
Glucose, UA: NEGATIVE
Leukocytes, UA: NEGATIVE
Nitrite, UA: NEGATIVE
PROTEIN UA: NEGATIVE
RBC UA: NEGATIVE
SPEC GRAV UA: 1.015
UROBILINOGEN UA: 0.2
pH, UA: 7

## 2016-03-02 MED ORDER — CLONAZEPAM 0.5 MG PO TABS: 0.5000 mg | ORAL_TABLET | Freq: Every day | ORAL | Status: DC | PRN

## 2016-03-02 NOTE — Patient Instructions (Addendum)
   IF you received an x-ray today, you will receive an invoice from [facility]. Please contact [facility] at [phone] with questions or concerns regarding your invoice.   IF you received labwork today, you will receive an invoice from [facility]. Please contact [facility] at [phone] with questions or concerns regarding your invoice.   Our billing staff will not be able to assist you with questions regarding bills from these companies.  You will be contacted with the lab results as soon as they are available. The fastest way to get your results is to activate your My Chart account. Instructions are located on the last page of this paperwork. If you have not heard from us regarding the results in 2 weeks, please contact this office.     Keeping You Healthy  Get These Tests 1. Blood Pressure- Have your blood pressure checked once a year by your health care provider.  Normal blood pressure is 120/80. 2. Weight- Have your body mass index (BMI) calculated to screen for obesity.  BMI is measure of body fat based on height and weight.  You can also calculate your own BMI at www.nhlbisupport.com/bmi/. 3. Cholesterol- Have your cholesterol checked every 5 years starting at age 20 then yearly starting at age 45. 4. Chlamydia, HIV, and other sexually transmitted diseases- Get screened every year until age 25, then within three months of each new sexual provider. 5. Pap Test - Every 1-5 years; discuss with your health care provider. 6. Mammogram- Every 1-2 years starting at age 40--50  Take these medicines  Calcium with Vitamin D-Your body needs 1200 mg of Calcium each day and 800-1000 IU of Vitamin D daily.  Your body can only absorb 500 mg of Calcium at a time so Calcium must be taken in 2 or 3 divided doses throughout the day.  Multivitamin with folic acid- Once daily if it is possible for you to become pregnant.  Get these  Immunizations  Gardasil-Series of three doses; prevents HPV related illness such as genital warts and cervical cancer.  Menactra-Single dose; prevents meningitis.  Tetanus shot- Every 10 years.  Flu shot-Every year.  Take these steps 1. Do not smoke-Your healthcare provider can help you quit.  For tips on how to quit go to www.smokefree.gov or call 1-800 QUITNOW. 2. Be physically active- Exercise 5 days a week for at least 30 minutes.  If you are not already physically active, start slow and gradually work up to 30 minutes of moderate physical activity.  Examples of moderate activity include walking briskly, dancing, swimming, bicycling, etc. 3. Breast Cancer- A self breast exam every month is important for early detection of breast cancer.  For more information and instruction on self breast exams, ask your healthcare provider or www.womenshealth.gov/faq/breast-self-exam.cfm. 4. Eat a healthy diet- Eat a variety of healthy foods such as fruits, vegetables, whole grains, low fat milk, low fat cheeses, yogurt, lean meats, poultry and fish, beans, nuts, tofu, etc.  For more information go to www. Thenutritionsource.org 5. Drink alcohol in moderation- Limit alcohol intake to one drink or less per day. Never drink and drive. 6. Depression- Your emotional health is as important as your physical health.  If you're feeling down or losing interest in things you normally enjoy please talk to your healthcare provider about being screened for depression. 7. Dental visit- Brush and floss your teeth twice daily; visit your dentist twice a year. 8. Eye doctor- Get an eye exam at least every   2 years. 9. Helmet use- Always wear a helmet when riding a bicycle, motorcycle, rollerblading or skateboarding. 10. Safe sex- If you may be exposed to sexually transmitted infections, use a condom. 11. Seat belts- Seat belts can save your live; always wear one. 12. Smoke/Carbon Monoxide detectors- These detectors need to  be installed on the appropriate level of your home. Replace batteries at least once a year. 13. Skin cancer- When out in the sun please cover up and use sunscreen 15 SPF or higher. 14. Violence- If anyone is threatening or hurting you, please tell your healthcare provider.        
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
## 2016-03-03 LAB — COMPREHENSIVE METABOLIC PANEL
ALBUMIN: 4.2 g/dL (ref 3.5–5.5)
ALK PHOS: 55 IU/L (ref 39–117)
ALT: 14 IU/L (ref 0–32)
AST: 17 IU/L (ref 0–40)
Albumin/Globulin Ratio: 2.3 — ABNORMAL HIGH (ref 1.2–2.2)
BUN / CREAT RATIO: 11 (ref 9–23)
BUN: 8 mg/dL (ref 6–24)
Bilirubin Total: 0.3 mg/dL (ref 0.0–1.2)
CO2: 24 mmol/L (ref 18–29)
CREATININE: 0.76 mg/dL (ref 0.57–1.00)
Calcium: 9.3 mg/dL (ref 8.7–10.2)
Chloride: 104 mmol/L (ref 96–106)
GFR calc non Af Amer: 96 mL/min/{1.73_m2} (ref >59)
GFR, EST AFRICAN AMERICAN: 110 mL/min/{1.73_m2} (ref >59)
GLOBULIN, TOTAL: 1.8 g/dL (ref 1.5–4.5)
Glucose: 97 mg/dL (ref 65–99)
Potassium: 3.8 mmol/L (ref 3.5–5.2)
SODIUM: 143 mmol/L (ref 134–144)
TOTAL PROTEIN: 6 g/dL (ref 6.0–8.5)

## 2016-03-03 LAB — LIPID PANEL
CHOL/HDL RATIO: 3.1 ratio (ref 0.0–4.4)
Cholesterol, Total: 175 mg/dL (ref 100–199)
HDL: 56 mg/dL (ref >39)
LDL CALC: 99 mg/dL (ref 0–99)
Triglycerides: 100 mg/dL (ref 0–149)
VLDL CHOLESTEROL CAL: 20 mg/dL (ref 5–40)

## 2016-03-03 LAB — HEMOGLOBIN A1C
Est. average glucose Bld gHb Est-mCnc: 108 mg/dL
HEMOGLOBIN A1C: 5.4 % (ref 4.8–5.6)

## 2016-03-03 LAB — TSH: TSH: 1.67 u[IU]/mL (ref 0.450–4.500)

## 2016-03-03 LAB — VITAMIN B12: Vitamin B-12: 998 pg/mL — ABNORMAL HIGH (ref 211–946)

## 2016-03-03 LAB — VITAMIN D 25 HYDROXY (VIT D DEFICIENCY, FRACTURES): Vit D, 25-Hydroxy: 39.7 ng/mL (ref 30.0–100.0)

## 2016-03-04 LAB — CBC WITH DIFFERENTIAL/PLATELET
BASOS: 1 %
Basophils Absolute: 0.1 10*3/uL (ref 0.0–0.2)
EOS (ABSOLUTE): 0.1 10*3/uL (ref 0.0–0.4)
EOS: 1 %
HEMATOCRIT: 42.8 % (ref 34.0–46.6)
HEMOGLOBIN: 14.7 g/dL (ref 11.1–15.9)
IMMATURE GRANS (ABS): 0 10*3/uL (ref 0.0–0.1)
IMMATURE GRANULOCYTES: 0 %
LYMPHS: 31 %
Lymphocytes Absolute: 1.8 10*3/uL (ref 0.7–3.1)
MCH: 30.4 pg (ref 26.6–33.0)
MCHC: 34.3 g/dL (ref 31.5–35.7)
MCV: 89 fL (ref 79–97)
Monocytes Absolute: 0.3 10*3/uL (ref 0.1–0.9)
Monocytes: 6 %
NEUTROS ABS: 3.7 10*3/uL (ref 1.4–7.0)
NEUTROS PCT: 61 %
PLATELETS: 284 10*3/uL (ref 150–379)
RBC: 4.83 x10E6/uL (ref 3.77–5.28)
RDW: 13.1 % (ref 12.3–15.4)
WBC: 6 10*3/uL (ref 3.4–10.8)
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
## 2016-06-23 ENCOUNTER — Encounter: Payer: Self-pay | Admitting: Family Medicine

## 2016-06-23 ENCOUNTER — Ambulatory Visit (INDEPENDENT_AMBULATORY_CARE_PROVIDER_SITE_OTHER): Payer: BC Managed Care – PPO

## 2016-06-23 ENCOUNTER — Ambulatory Visit (INDEPENDENT_AMBULATORY_CARE_PROVIDER_SITE_OTHER): Payer: BC Managed Care – PPO | Admitting: Family Medicine

## 2016-06-23 VITALS — BP 120/88 | HR 87 | Temp 98.5°F | Resp 18 | Ht 64.25 in | Wt 142.0 lb

## 2016-06-23 DIAGNOSIS — M25552 Pain in left hip: Secondary | ICD-10-CM

## 2016-06-23 DIAGNOSIS — M545 Low back pain, unspecified: Secondary | ICD-10-CM

## 2016-06-23 DIAGNOSIS — G5702 Lesion of sciatic nerve, left lower limb: Secondary | ICD-10-CM | POA: Diagnosis not present

## 2016-06-23 MED ORDER — METHOCARBAMOL 500 MG PO TABS: 500.0000 mg | ORAL_TABLET | Freq: Four times a day (QID) | ORAL | Status: DC | PRN

## 2016-06-23 MED ORDER — TRAMADOL HCL 50 MG PO TABS: 50.0000 mg | ORAL_TABLET | Freq: Four times a day (QID) | ORAL | Status: DC | PRN

## 2016-06-23 NOTE — Progress Notes (Signed)
Subjective:    Patient ID: Angela Morgan, female    DOB: 1971/02/16, 45 y.o.   MRN: [id number]  06/23/2016  Back Pain (Radiates down left leg. Injury in June.)   HPI This 45 y.o. female presents for evaluation of lower back pain.  Last week noticed back tightness.  Has been a while since did yoga. Rested it.  Did hot yoga two days ago.  With bending, tightness was radiating into hamstrings.  Little [age] two pound puppy decided that she was going to take off after a squirrel; fell on lower back one month ago; improved.  Then last week recurrent lower back tightness.  Last night, laying down and pain radiated into L hip laterally.  Hot yoga helped a little bit.  Pain is still there.  Severity 7/10. No radiation into leg but radiating into anterior hip.  No n/t unless prolonged sitting.  Normal b/b function; no saddle paresthesias.  Has taken Ibuprofen 800mg  every 6 hours; husband did have Tramadol two tablets two without improvement.  No muscle relaxers.   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     Review of Systems  Constitutional: Negative for chills, diaphoresis, fatigue and fever.  Genitourinary: Negative for decreased urine volume and difficulty urinating.  Musculoskeletal: Positive for arthralgias, back pain, gait problem, joint swelling and myalgias.  Neurological: Negative for weakness and numbness.    Past Medical History:  Diagnosis Date  . Adjustment disorder with depressed mood   . Allergic rhinitis, cause unspecified   . Allergy   . Anxiety   . Arthropathy, unspecified, site unspecified   . Cellulitis 11/2007   Orbital Shingles   Hospitalized  . Depression   . Depressive disorder, not elsewhere classified   . Fibromyalgia   . GERD (gastroesophageal reflux disease)   . Insomnia, unspecified   . Irritable bowel syndrome   . Migraine, unspecified, without mention of intractable migraine without  mention of status migrainosus   . Postablative ovarian failure   . Situational, disturbance, acute   . Symptomatic states associated with artificial menopause   . Urticaria, unspecified   . Vestibular dysfunction   . Vestibular neuritis    Past Surgical History:  Procedure Laterality Date  . APPENDECTOMY    . CESAREAN SECTION     x 2  . CHOLECYSTECTOMY    . ESOPHAGOGASTRODUODENOSCOPY (EGD) WITH PROPOFOL N/A 01/12/2016   Procedure: ESOPHAGOGASTRODUODENOSCOPY (EGD) WITH PROPOFOL;  Surgeon: Hulen Luster, MD;  Location: [facility] ENDOSCOPY;  Service: Gastroenterology;  Laterality: N/A;  . KNEE SURGERY     right  . TEMPOROMANDIBULAR JOINT SURGERY    . TOTAL ABDOMINAL HYSTERECTOMY     ovaries removed   Allergies  Allergen Reactions  . Biaxin [Clarithromycin] Anaphylaxis    Social History   Social History  . Marital status: Married    Spouse name: n/a  . Number of children: 2  . Years of education: college   Occupational History  . 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 Smoking status: Never Smoker  . Smokeless tobacco: Never Used  . Alcohol use 2.4 oz/week    4 Glasses of wine per week  . Drug use: No  . Sexual activity: Yes    Birth control/ protection: Post-menopausal   Other Topics Concern  . Not on file   Social History Narrative   Marital Status:separated in 09/2013 after  married x  16+ years. Getting married in [place]:  2 children (59, 17); no grandchildren.       Living:  living in apartment with fiance in [place].  Son is in [place].      Employment:  Working at [employer] sine 08/2013. [place] job.      Exercise: exercising at [facility] per week.      Always uses seat belts / helmet.       Smoke alarm in the home.       No guns in the home.     Caffeine use: moderate      Education: College                              Family History  Problem Relation Age of Onset  .  Diabetes Maternal Grandfather   . Hypertension Mother   . Stroke Mother   . COPD Mother   . Aneurysm Mother     brain  . Transient ischemic attack Mother     multiple  . Hyperlipidemia Mother   . Hypertension Father   . Heart disease Father   . Mental illness Father   . Depression Father   . Hyperlipidemia Father   . Hyperlipidemia Brother   . Hypertension Brother   . Hypertension Brother        Objective:    BP 120/88   Pulse 87   Temp 98.5 F (36.9 C) (Oral)   Resp 18   Ht 5' 4.25" (1.632 m)   Wt 142 lb (64.4 kg)   SpO2 99%   BMI 24.18 kg/m   Physical Exam  Constitutional: She is oriented to person, place, and time. She appears well-developed and well-nourished. No distress.  HENT:  Head: Normocephalic and atraumatic.  Eyes: Conjunctivae are normal. Pupils are equal, round, and reactive to light.  Neck: Normal range of motion. Neck supple.  Cardiovascular: Normal rate, regular rhythm and normal heart sounds.  Exam reveals no gallop and no friction rub.   No murmur heard. Pulmonary/Chest: Effort normal and breath sounds normal. She has no wheezes. She has no rales.  Musculoskeletal:       Right hip: Normal. She exhibits normal range of motion, normal strength, no tenderness and no bony tenderness.       Left hip: Normal. She exhibits normal range of motion, normal strength, no tenderness and no bony tenderness.       Lumbar back: She exhibits pain and spasm. She exhibits normal range of motion, no tenderness, no bony tenderness and normal pulse.  Lumbar spine:  Non-tender midline; non-tender paraspinal regions B.  Straight leg raises negative B; toe and heel walking intact; marching intact; motor 5/5 BLE.  Full ROM lumbar spine without limitation.   Neurological: She is alert and oriented to person, place, and time.  Skin: She is not diaphoretic.  Psychiatric: She has a normal mood and affect. Her behavior is normal.  Nursing note and vitals reviewed.  Results for  orders placed or performed in visit on 03/02/16  CBC with Differential/Platelet  Result Value Ref Range   WBC 6.0 3.4 - 10.8 x10E3/uL   RBC 4.83 3.77 - 5.28 x10E6/uL   Hemoglobin 14.7 11.1 - 15.9 g/dL   Hematocrit 42.8 34.0 - 46.6 %   MCV 89 79 - 97 fL   MCH 30.4 26.6 - 33.0 pg  MCHC 34.3 31.5 - 35.7 g/dL   RDW 13.1 12.3 - 15.4 %   Platelets 284 150 - 379 x10E3/uL   Neutrophils 61 %   Lymphs 31 %   Monocytes 6 %   Eos 1 %   Basos 1 %   Neutrophils Absolute 3.7 1.4 - 7.0 x10E3/uL   Lymphocytes Absolute 1.8 0.7 - 3.1 x10E3/uL   Monocytes Absolute 0.3 0.1 - 0.9 x10E3/uL   EOS (ABSOLUTE) 0.1 0.0 - 0.4 x10E3/uL   Basophils Absolute 0.1 0.0 - 0.2 x10E3/uL   Immature Granulocytes 0 %   Immature Grans (Abs) 0.0 0.0 - 0.1 x10E3/uL  Comprehensive metabolic panel  Result Value Ref Range   Glucose 97 65 - 99 mg/dL   BUN 8 6 - 24 mg/dL   Creatinine, Ser 0.76 0.57 - 1.00 mg/dL   GFR calc non Af Amer 96 >59 mL/min/1.73   GFR calc Af Amer 110 >59 mL/min/1.73   BUN/Creatinine Ratio 11 9 - 23   Sodium 143 134 - 144 mmol/L   Potassium 3.8 3.5 - 5.2 mmol/L   Chloride 104 96 - 106 mmol/L   CO2 24 18 - 29 mmol/L   Calcium 9.3 8.7 - 10.2 mg/dL   Total Protein 6.0 6.0 - 8.5 g/dL   Albumin 4.2 3.5 - 5.5 g/dL   Globulin, Total 1.8 1.5 - 4.5 g/dL   Albumin/Globulin Ratio 2.3 (H) 1.2 - 2.2   Bilirubin Total 0.3 0.0 - 1.2 mg/dL   Alkaline Phosphatase 55 39 - 117 IU/L   AST 17 0 - 40 IU/L   ALT 14 0 - 32 IU/L  Hemoglobin A1c  Result Value Ref Range   Hgb A1c MFr Bld 5.4 4.8 - 5.6 %   Est. average glucose Bld gHb Est-mCnc 108 mg/dL  Lipid panel  Result Value Ref Range   Cholesterol, Total 175 100 - 199 mg/dL   Triglycerides 100 0 - 149 mg/dL   HDL 56 >39 mg/dL   VLDL Cholesterol Cal 20 5 - 40 mg/dL   LDL Calculated 99 0 - 99 mg/dL   Chol/HDL Ratio 3.1 0.0 - 4.4 ratio units  TSH  Result Value Ref Range   TSH 1.670 0.450 - 4.500 uIU/mL  Vitamin B12  Result Value Ref Range   Vitamin B-12  998 (H) 211 - 946 pg/mL  VITAMIN D 25 Hydroxy (Vit-D Deficiency, Fractures)  Result Value Ref Range   Vit D, 25-Hydroxy 39.7 30.0 - 100.0 ng/mL  POCT urinalysis dipstick  Result Value Ref Range   Color, UA yellow yellow   Clarity, UA clear clear   Glucose, UA negative negative   Bilirubin, UA negative negative   Ketones, POC UA negative negative   Spec Grav, UA 1.015    Blood, UA negative negative   pH, UA 7.0    Protein Ur, POC negative negative   Urobilinogen, UA 0.2    Nitrite, UA Negative Negative   Leukocytes, UA Negative Negative  POCT Wet + KOH Prep  Result Value Ref Range   Yeast by KOH Absent Present, Absent   Yeast by wet prep Absent Present, Absent   WBC by wet prep None None, Few, Too numerous to count   Clue Cells Wet Prep HPF POC None None, Too numerous to count   Trich by wet prep Absent Present, Absent   Bacteria Wet Prep HPF POC Few None, Few, Too numerous to count   Epithelial Cells By [employer] Pref ([facility]) Few None, Few, Too numerous to  count   RBC,UR,HPF,POC None None RBC/hpf       Assessment & Plan:   1. Piriformis syndrome of left side   2. Left-sided low back pain without sciatica   3. Left hip pain    -new. -rx provided. -home exercise program provided to perform daily. -if no improvement in 2 weeks, call for PT and/or ortho referral.   Orders Placed This Encounter  Procedures  . DG Lumbar Spine Complete    Standing Status:   Future    Number of Occurrences:   1    Standing Expiration Date:   06/23/2017    Order Specific Question:   Reason for Exam (SYMPTOM  OR DIAGNOSIS REQUIRED)    Answer:   L lower back pain at SI joint; pain radiates into hamstring and L hip    Order Specific Question:   Is the patient pregnant?    Answer:   No    Order Specific Question:   Preferred imaging location?    Answer:   External  . DG HIP UNILAT W OR W/O PELVIS 2-3 VIEWS LEFT    Standing Status:   Future    Number of Occurrences:   1    Standing Expiration  Date:   06/23/2017    Order Specific Question:   Reason for Exam (SYMPTOM  OR DIAGNOSIS REQUIRED)    Answer:   L lower back pain at SI joint; pain radiates into hamstring and L hip    Order Specific Question:   Is the patient pregnant?    Answer:   No    Order Specific Question:   Preferred imaging location?    Answer:   External   Meds ordered this encounter  Medications  . methocarbamol (ROBAXIN) 500 MG tablet    Sig: Take 1 tablet (500 mg total) by mouth every 6 (six) hours as needed for muscle spasms.    Dispense:  40 tablet    Refill:  0  . traMADol (ULTRAM) 50 MG tablet    Sig: Take 1 tablet (50 mg total) by mouth every 6 (six) hours as needed.    Dispense:  40 tablet    Refill:  0    Return if symptoms worsen or fail to improve.    Jaiyah Beining Elayne Guerin, M.D. [facility] [address] [place], [place]  [phone] phone [phone] fax

## 2016-06-23 NOTE — Patient Instructions (Addendum)
IF you received an x-ray today, you will receive an invoice from [facility]. Please contact [facility] at [phone] with questions or concerns regarding your invoice.   IF you received labwork today, you will receive an invoice from [employer]. Please contact [facility] at [phone] with questions or concerns regarding your invoice.   Our billing staff will not be able to assist you with questions regarding bills from these companies.  You will be contacted with the lab results as soon as they are available. The fastest way to get your results is to activate your My Chart account. Instructions are located on the last page of this paperwork. If you have not heard from [place] regarding the results in 2 weeks, please contact this office.     Piriformis Syndrome With Rehab Piriformis syndrome is a condition the affects the nervous system in the area of the hip, and is characterized by pain and possibly a loss of feeling in the backside (posterior) thigh that may extend down the entire length of the leg. The symptoms are caused by an increase in pressure on the sciatic nerve by the piriformis muscle, which is on the back of the hip and is responsible for externally rotating the hip. The sciatic nerve and its branches connect to much of the leg. Normally the sciatic nerve runs between the piriformis muscle and other muscles. However, in certain individuals the nerve runs through the muscle, which causes an increase in pressure on the nerve and results in the symptoms of piriformis syndrome. SYMPTOMS   Pain, tingling, numbness, or burning in the back of the thigh that may also extend down the entire leg.  Occasionally, tenderness in the buttock.  Loss of function of the leg.  Pain that worsens when using the piriformis muscle (running, jumping, or stairs).  Pain that increases with prolonged sitting.  Pain that is lessened by lying flat on the  back. CAUSES   Piriformis syndrome is the result of an increase in pressure placed on the sciatic nerve. Oftentimes, piriformis syndrome is an overuse injury.  Stress placed on the nerve from a sudden increase in the intensity, frequency, or duration of training.  Compensation of other extremity injuries. RISK INCREASES WITH:  Sports that involve the piriformis muscle (running, walking, or jumping).  You are born with (congenital) a defect in which the sciatic nerve passes through the muscle. PREVENTION  Warm up and stretch properly before activity.  Allow for adequate recovery between workouts.  Maintain physical fitness:  Strength, flexibility, and endurance.  Cardiovascular fitness. PROGNOSIS  If treated properly, the symptoms of piriformis syndrome usually resolve in 2 to 6 weeks. RELATED COMPLICATIONS   Persistent and possibly permanent pain and numbness in the lower extremity.  Weakness of the extremity that may progress to disability and inability to compete. TREATMENT  The most effective treatment for piriformis syndrome is rest from any activities that aggravate the symptoms. Ice and pain medication may help reduce pain and inflammation. The use of strengthening and stretching exercises may help reduce pain with activity. These exercises may be performed at home or with a therapist. A referral to a therapist may be given for further evaluation and treatment, such as ultrasound. Corticosteroid injections may be given to reduce inflammation that is causing pressure to be placed on the sciatic nerve. If nonsurgical (conservative) treatment is unsuccessful, then surgery may be recommended.  MEDICATION   If pain medication is necessary, then nonsteroidal anti-inflammatory medications,  such as aspirin and ibuprofen, or other minor pain relievers, such as acetaminophen, are often recommended.  Do not take pain medication for 7 days before surgery.  Prescription pain relievers  may be given if deemed necessary by your caregiver. Use only as directed and only as much as you need.  Corticosteroid injections may be given by your caregiver. These injections should be reserved for the most serious cases, because they may only be given a certain number of times. HEAT AND COLD:   Cold treatment (icing) relieves pain and reduces inflammation. Cold treatment should be applied for 10 to 15 minutes every 2 to 3 hours for inflammation and pain and immediately after any activity that aggravates your symptoms. Use ice packs or massage the area with a piece of ice (ice massage).  Heat treatment may be used prior to performing the stretching and strengthening activities prescribed by your caregiver, physical therapist, or athletic trainer. Use a heat pack or soak the injury in warm water. SEEK IMMEDIATE MEDICAL CARE IF:  Treatment seems to offer no benefit, or the condition worsens.  Any medications produce adverse side effects. EXERCISES RANGE OF MOTION (ROM) AND STRETCHING EXERCISES - Piriformis Syndrome These exercises may help you when beginning to rehabilitate your injury. Your symptoms may resolve with or without further involvement from your physician, physical therapist, or athletic trainer. While completing these exercises, remember:   Restoring tissue flexibility helps normal motion to return to the joints. This allows healthier, less painful movement and activity.  An effective stretch should be held for at least 30 seconds.  A stretch should never be painful. You should only feel a gentle lengthening or release in the stretched tissue. STRETCH - Hip Rotators  Lie on your back on a firm surface. Grasp your right / left knee with your right / left hand and your ankle with your opposite hand.  Keeping your hips and shoulders firmly planted, gently pull your right / left knee and rotate your lower leg toward your opposite shoulder until you feel a stretch in your  buttocks.  Hold this stretch for __________ seconds. Repeat this stretch __________ times. Complete this stretch __________ times per day. STRETCH - Iliotibial Band  On the floor or bed, lie on your side so your right / left leg is on top. Bend your knee and grab your ankle.  Slowly bring your knee back so that your thigh is in line with your trunk. Keep your heel at your buttocks and gently arch your back so your head, shoulders, and hips line up.  Slowly lower your leg so that your knee approaches the floor/bed until you feel a gentle stretch on the outside of your right / left thigh. If you do not feel a stretch and your knee will not fall farther, place the heel of your opposite foot on top of your knee and pull your thigh down farther.  Hold this stretch for __________ seconds. Repeat __________ times. Complete __________ times per day. STRENGTHENING EXERCISES - Piriformis Syndrome  These are some of the caregiver again or until your symptoms are resolved. Remember:   Strong muscles with good endurance tolerate stress better.  Do the exercises as initially prescribed by your caregiver. Progress slowly with each exercise, gradually increasing the number of repetitions and weight used under their guidance. STRENGTH - Hip Abductors, Straight Leg Raises Be aware of your form throughout the entire exercise so that you exercise the correct muscles. Sloppy form means that you are  not strengthening the correct muscles.  Lie on your side so that your head, shoulders, knee, and hip line up. You may bend your lower knee to help maintain your balance. Your right / left leg should be on top.  Roll your hips slightly forward, so that your hips are stacked directly over each other and your right / left knee is facing forward.  Lift your top leg up 4-6 inches, leading with your heel. Be sure that your foot does not drift forward or that your knee does not roll toward the ceiling.  Hold this  position for __________ seconds. You should feel the muscles in your outer hip lifting (you may not notice this until your leg begins to tire).  Slowly lower your leg to the starting position. Allow the muscles to fully relax before beginning the next repetition. Repeat __________ times. Complete this exercise __________ times per day.  STRENGTH - Hip Abductors, Quadruped  On a firm, lightly padded surface, position yourself on your hands and knees. Your hands should be directly below your shoulders and your knees should be directly below your hips.  Keeping your right / left knee bent, lift your leg out to the side. Keep your legs level and in line with your shoulders.  Position yourself on your hands and knees.  Hold for __________ seconds.  Keeping your trunk steady and your hips level, slowly lower your leg to the starting position. Repeat __________ times. Complete this exercise __________ times per day.  STRENGTH - Hip Abductors, Standing  Tie one end of a rubber exercise band/tubing to a secure surface (table, pole) and tie a loop at the other end.  Place the loop around your right / left ankle. Keeping your ankle with the band directly opposite of the secured end, step away until there is tension in the tube/band.  Hold onto a chair as needed for balance.  Keeping your back upright, your shoulders over your hips, and your toes pointing forward, lift your right / left leg out to your side. Be sure to lift your leg with your hip muscles. Do not "throw" your leg or tip your body to lift your leg.  Slowly and with control, return to the starting position. Repeat exercise __________ times. Complete this exercise __________ times per day.    This information is not intended to replace advice given to you by your health care provider. Make sure you discuss any questions you have with your health care provider.   Document Released: 11/22/2005 Document Revised: 04/08/2015 Document  Reviewed: 03/06/2009 Elsevier Interactive Patient Education [employer].

## 2016-08-31 ENCOUNTER — Ambulatory Visit: Payer: BC Managed Care – PPO | Admitting: Family Medicine

## 2016-10-20 ENCOUNTER — Encounter: Payer: Self-pay | Admitting: Family Medicine

## 2016-10-20 ENCOUNTER — Ambulatory Visit (INDEPENDENT_AMBULATORY_CARE_PROVIDER_SITE_OTHER): Payer: BC Managed Care – PPO | Admitting: Family Medicine

## 2016-10-20 VITALS — BP 124/70 | HR 70 | Temp 98.4°F | Resp 18 | Ht 64.25 in | Wt 142.0 lb

## 2016-10-20 DIAGNOSIS — J0101 Acute recurrent maxillary sinusitis: Secondary | ICD-10-CM

## 2016-10-20 DIAGNOSIS — J069 Acute upper respiratory infection, unspecified: Secondary | ICD-10-CM

## 2016-10-20 DIAGNOSIS — B9789 Other viral agents as the cause of diseases classified elsewhere: Secondary | ICD-10-CM | POA: Diagnosis not present

## 2016-10-20 DIAGNOSIS — F430 Acute stress reaction: Secondary | ICD-10-CM

## 2016-10-20 MED ORDER — AMOXICILLIN-POT CLAVULANATE 875-125 MG PO TABS: 1.0000 | ORAL_TABLET | Freq: Two times a day (BID) | ORAL | 0 refills | Status: DC

## 2016-10-20 MED ORDER — PREDNISONE 20 MG PO TABS: ORAL_TABLET | ORAL | 0 refills | Status: DC

## 2016-10-20 NOTE — Progress Notes (Signed)
Subjective:    Patient ID: Angela Morgan, female    DOB: 1971/03/06, 45 y.o.   MRN: [id number]  10/20/2016  Cough; Nasal Congestion; and Headache   HPI This 45 y.o. female presents for acute visit. Feels horrible.  Onset last week; job shifted; now in room full of grown males with autism at [place] age 70-22.  Miserable with current job position. Looking for another job.  Must build up immune system; no exposure to children. 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  Must stand in weather.  Last week, onset with viral symptoms.  Achy.  Fatigued.  No fever.  Depression going on.  Onset eight days ago.  Five days after onset, acutely worsening with thick head congestion and sputum production green.  Made hot tottie with burboun.  Chest feels tight; feels crappy.  Nyquil a lot.  No history of asthma.  Has restarted psychotherapy due to stressors of combined families and holiday season with adult children.   Review of Systems  Constitutional: Positive for fatigue. Negative for chills, diaphoresis and fever.  HENT: Positive for congestion, postnasal drip, rhinorrhea, sinus pain, sinus pressure and voice change. Negative for ear pain, sore throat and trouble swallowing.   Eyes: Negative for visual disturbance.  Respiratory: Positive for cough. Negative for shortness of breath.   Cardiovascular: Negative for chest pain, palpitations and leg swelling.  Gastrointestinal: Negative for abdominal pain, constipation, diarrhea, nausea and vomiting.  Endocrine: Negative for cold intolerance, heat intolerance, polydipsia, polyphagia and polyuria.  Neurological: Negative for dizziness, tremors, seizures, syncope, facial asymmetry, speech difficulty, weakness, light-headedness, numbness and headaches.  Psychiatric/Behavioral: Positive for dysphoric mood. Negative for self-injury, sleep disturbance and suicidal ideas. The patient is nervous/anxious.     Past Medical History:  Diagnosis Date  .  Adjustment disorder with depressed mood   . Allergic rhinitis, cause unspecified   . Allergy   . Anxiety   . Arthropathy, unspecified, site unspecified   . Cellulitis 11/2007   Orbital Shingles   Hospitalized  . Depression   . Depressive disorder, not elsewhere classified   . Fibromyalgia   . GERD (gastroesophageal reflux disease)   . Insomnia, unspecified   . Irritable bowel syndrome   . Migraine, unspecified, without mention of intractable migraine without mention of status migrainosus   . Postablative ovarian failure   . Situational, disturbance, acute   . Symptomatic states associated with artificial menopause   . Urticaria, unspecified   . Vestibular dysfunction   . Vestibular neuritis    Past Surgical History:  Procedure Laterality Date  . APPENDECTOMY    . CESAREAN SECTION     x 2  . CHOLECYSTECTOMY    . ESOPHAGOGASTRODUODENOSCOPY (EGD) WITH PROPOFOL N/A 01/12/2016   Procedure: ESOPHAGOGASTRODUODENOSCOPY (EGD) WITH PROPOFOL;  Surgeon: Hulen Luster, MD;  Location: [facility] ENDOSCOPY;  Service: Gastroenterology;  Laterality: N/A;  . KNEE SURGERY     right  . TEMPOROMANDIBULAR JOINT SURGERY    . TOTAL ABDOMINAL HYSTERECTOMY     ovaries removed   Allergies  Allergen Reactions  . Biaxin [Clarithromycin] Anaphylaxis    Social History   Social History  . Marital status: Married    Spouse name: n/a  . Number of children: 2  . Years of education: college   Occupational History  . 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 Smoking status: Never Smoker  . Smokeless tobacco: Never Used  .  Alcohol use 2.4 oz/week    4 Glasses of wine per week  . Drug use: No  . Sexual activity: Yes    Birth control/ protection: Post-menopausal   Other Topics Concern  . Not on file   Social History Narrative   Marital Status:separated in 09/2013 after married x  16+ years. Getting married in [place]:  2 children (75, 17); no  grandchildren.       Living:  living in apartment with fiance in [place].  Son is in [place].      Employment:  Working at [employer] sine 08/2013. [place] job.      Exercise: exercising at [facility] per week.      Always uses seat belts / helmet.       Smoke alarm in the home.       No guns in the home.     Caffeine use: moderate      Education: College                              Family History  Problem Relation Age of Onset  . Diabetes Maternal Grandfather   . Hypertension Mother   . Stroke Mother   . COPD Mother   . Aneurysm Mother     brain  . Transient ischemic attack Mother     multiple  . Hyperlipidemia Mother   . Hypertension Father   . Heart disease Father   . Mental illness Father   . Depression Father   . Hyperlipidemia Father   . Hyperlipidemia Brother   . Hypertension Brother   . Hypertension Brother        Objective:    BP 124/70 (BP Location: Left Arm, Patient Position: Sitting, Cuff Size: Small)   Pulse 70   Temp 98.4 F (36.9 C) (Oral)   Resp 18   Ht 5' 4.25" (1.632 m)   Wt 142 lb (64.4 kg)   SpO2 99%   BMI 24.18 kg/m  Physical Exam  Constitutional: She is oriented to person, place, and time. She appears well-developed and well-nourished. No distress.  HENT:  Head: Normocephalic and atraumatic.  Right Ear: Tympanic membrane, external ear and ear canal normal.  Left Ear: Tympanic membrane, external ear and ear canal normal.  Nose: Mucosal edema and rhinorrhea present. Right sinus exhibits maxillary sinus tenderness. Right sinus exhibits no frontal sinus tenderness. Left sinus exhibits maxillary sinus tenderness. Left sinus exhibits no frontal sinus tenderness.  Mouth/Throat: Oropharynx is clear and moist.  Eyes: Conjunctivae and EOM are normal. Pupils are equal, round, and reactive to light.  Neck: Normal range of motion. Neck supple. Carotid bruit is not present. No thyromegaly present.  Cardiovascular: Normal rate,  regular rhythm, normal heart sounds and intact distal pulses.  Exam reveals no gallop and no friction rub.   No murmur heard. Pulmonary/Chest: Effort normal and breath sounds normal. She has no wheezes. She has no rales.  Abdominal: Soft. Bowel sounds are normal. She exhibits no distension and no mass. There is no tenderness. There is no rebound and no guarding.  Lymphadenopathy:    She has no cervical adenopathy.  Neurological: She is alert and oriented to person, place, and time. No cranial nerve deficit.  Skin: Skin is warm and dry. No rash noted. She is not diaphoretic. No erythema. No pallor.  Psychiatric: She has a normal mood and affect.  Her behavior is normal. Judgment and thought content normal.  Tearful.   Depression screen [facility] 2/9 10/20/2016 06/23/2016 06/23/2016 03/02/2016 12/01/2015  Decreased Interest 0 0 0 1 0  Down, Depressed, Hopeless 0 0 0 0 0  PHQ - 2 Score 0 0 0 1 0        Assessment & Plan:   1. Viral URI   2. Acute recurrent maxillary sinusitis   3. Acute stress reaction    -New; rx for Augmentin, Prednisone provided; recommend Flonase and Mucinex DM. -counseling provided in office due to recent stressors; encourage ongoing counseling with Mal[facility] Mood. -OOW note provided.  No orders of the defined types were placed in this encounter.  Meds ordered this encounter  Medications  . amoxicillin-clavulanate (AUGMENTIN) 875-125 MG tablet    Sig: Take 1 tablet by mouth 2 (two) times daily.    Dispense:  20 tablet    Refill:  0  . predniSONE (DELTASONE) 20 MG tablet    Sig: Take 3 PO QAM x 1 day, 2 PO QAM x 5 days, 1 PO QAM x 5 days    Dispense:  18 tablet    Refill:  0    No Follow-up on file.   Muneer Leider Elayne Guerin, M.D. [facility] [address] [place], [place]  [address] [phone] phone [phone] fax

## 2016-10-20 NOTE — Patient Instructions (Addendum)
IF you received an x-ray today, you will receive an invoice from [facility]. Please contact [facility] at [phone] with questions or concerns regarding your invoice.   IF you received labwork today, you will receive an invoice from [facility]. Please contact [facility] at [phone] with questions or concerns regarding your invoice.   Our billing staff will not be able to assist you with questions regarding bills from these companies.  You will be contacted with the lab results as soon as they are available. The fastest way to get your results is to activate your My Chart account. Instructions are located on the last page of this paperwork. If you have not heard from [place] regarding the results in 2 weeks, please contact this office.     Sinusitis, Adult Sinusitis is soreness and inflammation of your sinuses. Sinuses are hollow spaces in the bones around your face. Your sinuses are located:  Around your eyes.  In the middle of your forehead.  Behind your nose.  In your cheekbones. Your sinuses and nasal passages are lined with a stringy fluid (mucus). Mucus normally drains out of your sinuses. When your nasal tissues become inflamed or swollen, the mucus can become trapped or blocked so air cannot flow through your sinuses. This allows bacteria, viruses, and funguses to grow, which leads to infection. Sinusitis can develop quickly and last for 7?10 days (acute) or for more than 12 weeks (chronic). Sinusitis often develops after a cold. What are the causes? This condition is caused by anything that creates swelling in the sinuses or stops mucus from draining, including:  Allergies.  Asthma.  Bacterial or viral infection.  Abnormally shaped bones between the nasal passages.  Nasal growths that contain mucus (nasal polyps).  Narrow sinus openings.  Pollutants, such as chemicals or irritants in the air.  A foreign object stuck  in the nose.  A fungal infection. This is rare. What increases the risk? The following factors may make you more likely to develop this condition:  Having allergies or asthma.  Having had a recent cold or respiratory tract infection.  Having structural deformities or blockages in your nose or sinuses.  Having a weak immune system.  Doing a lot of swimming or diving.  Overusing nasal sprays.  Smoking. What are the signs or symptoms? The main symptoms of this condition are pain and a feeling of pressure around the affected sinuses. Other symptoms include:  Upper toothache.  Earache.  Headache.  Bad breath.  Decreased sense of smell and taste.  A cough that may get worse at night.  Fatigue.  Fever.  Thick drainage from your nose. The drainage is often green and it may contain pus (purulent).  Stuffy nose or congestion.  Postnasal drip. This is when extra mucus collects in the throat or back of the nose.  Swelling and warmth over the affected sinuses.  Sore throat.  Sensitivity to light. How is this diagnosed? This condition is diagnosed based on symptoms, a medical history, and a physical exam. To find out if your condition is acute or chronic, your health care provider may:  Look in your nose for signs of nasal polyps.  Tap over the affected sinus to check for signs of infection.  View the inside of your sinuses using an imaging device that has a light attached (endoscope). If your health care provider suspects that you have chronic sinusitis, you may also:  Be tested for allergies.  Have a  sample of mucus taken from your nose (nasal culture) and checked for bacteria.  Have a mucus sample examined to see if your sinusitis is related to an allergy. If your sinusitis does not respond to treatment and it lasts longer than 8 weeks, you may have an MRI or CT scan to check your sinuses. These scans also help to determine how severe your infection is. In rare  cases, a bone biopsy may be done to rule out more serious types of fungal sinus disease. How is this treated? Treatment for sinusitis depends on the cause and whether your condition is chronic or acute. If a virus is causing your sinusitis, your symptoms will go away on their own within 10 days. You may be given medicines to relieve your symptoms, including:  Topical nasal decongestants. They shrink swollen nasal passages and let mucus drain from your sinuses.  Antihistamines. These drugs block inflammation that is triggered by allergies. This can help to ease swelling in your nose and sinuses.  Topical nasal corticosteroids. These are nasal sprays that ease inflammation and swelling in your nose and sinuses.  Nasal saline washes. These rinses can help to get rid of thick mucus in your nose. If your condition is caused by bacteria, you will be given an antibiotic medicine. If your condition is caused by a fungus, you will be given an antifungal medicine. Surgery may be needed to correct underlying conditions, such as narrow nasal passages. Surgery may also be needed to remove polyps. Follow these instructions at home: Medicines  Take, use, or apply over-the-counter and prescription medicines only as told by your health care provider. These may include nasal sprays.  If you were prescribed an antibiotic medicine, take it as told by your health care provider. Do not stop taking the antibiotic even if you start to feel better. Hydrate and Humidify  Drink enough water to keep your urine clear or pale yellow. Staying hydrated will help to thin your mucus.  Use a cool mist humidifier to keep the humidity level in your home above 50%.  Inhale steam for 10-15 minutes, 3-4 times a day or as told by your health care provider. You can do this in the bathroom while a hot shower is running.  Limit your exposure to cool or dry air. Rest  Rest as much as possible.  Sleep with your head raised  (elevated).  Make sure to get enough sleep each night. General instructions  Apply a warm, moist washcloth to your face 3-4 times a day or as told by your health care provider. This will help with discomfort.  Wash your hands often with soap and water to reduce your exposure to viruses and other germs. If soap and water are not available, use hand sanitizer.  Do not smoke. Avoid being around people who are smoking (secondhand smoke).  Keep all follow-up visits as told by your health care provider. This is important. Contact a health care provider if:  You have a fever.  Your symptoms get worse.  Your symptoms do not improve within 10 days. Get help right away if:  You have a severe headache.  You have persistent vomiting.  You have pain or swelling around your face or eyes.  You have vision problems.  You develop confusion.  Your neck is stiff.  You have trouble breathing. This information is not intended to replace advice given to you by your health care provider. Make sure you discuss any questions you have with your health  care provider. Document Released: 11/22/2005 Document Revised: 07/18/2016 Document Reviewed: 09/17/2015 Elsevier Interactive Patient Education  2017 [employer].

## 2016-10-22 ENCOUNTER — Ambulatory Visit: Payer: BC Managed Care – PPO

## 2016-10-22 ENCOUNTER — Telehealth: Payer: Self-pay

## 2016-10-22 NOTE — Telephone Encounter (Signed)
Pt was seen here on 10/20/16 by Dr. Tamala Julian for Viral URI. She is having a hard time sleeping at night, she would like a cough syrup sent to Pharmacy:  [facility] [place], [place]. Please advise at [phone]

## 2016-10-27 NOTE — Telephone Encounter (Signed)
LMOM advising that if she still needs a cough med after having been on Abx and pred for several days, to please call me back and I will ask Dr Tamala Julian if she can Rx something. Also recommended Delsyn or mucinex DM OTC if she still has some cough but doesn't need something as strong as narcotic.

## 2016-12-20 ENCOUNTER — Other Ambulatory Visit: Payer: Self-pay | Admitting: Obstetrics and Gynecology

## 2016-12-20 DIAGNOSIS — N63 Unspecified lump in unspecified breast: Secondary | ICD-10-CM

## 2016-12-24 ENCOUNTER — Other Ambulatory Visit: Payer: Self-pay | Admitting: Family Medicine

## 2016-12-24 NOTE — Telephone Encounter (Signed)
Please call in refill of Clonazepam as approved. 

## 2016-12-24 NOTE — Telephone Encounter (Signed)
02/2016 last refill with 1 additional Last ov 10/2016

## 2016-12-25 NOTE — Telephone Encounter (Signed)
Called in to [facility]

## 2017-01-14 ENCOUNTER — Ambulatory Visit: Payer: BC Managed Care – PPO

## 2017-01-14 ENCOUNTER — Other Ambulatory Visit: Payer: BC Managed Care – PPO

## 2017-01-28 ENCOUNTER — Ambulatory Visit
Admission: RE | Admit: 2017-01-28 | Discharge: 2017-01-28 | Disposition: A | Discharge status: Home / Self Care | Payer: BC Managed Care – PPO | Source: Ambulatory Visit | Attending: Obstetrics and Gynecology | Admitting: Obstetrics and Gynecology

## 2017-01-28 DIAGNOSIS — N63 Unspecified lump in unspecified breast: Secondary | ICD-10-CM

## 2017-02-01 ENCOUNTER — Encounter: Payer: Self-pay | Admitting: Obstetrics and Gynecology

## 2017-03-02 ENCOUNTER — Ambulatory Visit (INDEPENDENT_AMBULATORY_CARE_PROVIDER_SITE_OTHER): Payer: BC Managed Care – PPO | Admitting: Physician Assistant

## 2017-03-02 VITALS — BP 107/72 | HR 63 | Temp 98.6°F | Resp 17 | Ht 64.5 in | Wt 145.0 lb

## 2017-03-02 DIAGNOSIS — J0101 Acute recurrent maxillary sinusitis: Secondary | ICD-10-CM

## 2017-03-02 MED ORDER — GUAIFENESIN ER 1200 MG PO TB12: 1.0000 | ORAL_TABLET | Freq: Two times a day (BID) | ORAL | 1 refills | Status: DC | PRN

## 2017-03-02 MED ORDER — BENZONATATE 100 MG PO CAPS: 100.0000 mg | ORAL_CAPSULE | Freq: Three times a day (TID) | ORAL | 0 refills | Status: DC | PRN

## 2017-03-02 MED ORDER — AMOXICILLIN-POT CLAVULANATE 875-125 MG PO TABS: 1.0000 | ORAL_TABLET | Freq: Two times a day (BID) | ORAL | 0 refills | Status: DC

## 2017-03-02 NOTE — Patient Instructions (Addendum)
Get plenty of rest and drink at least 64 ounces of water daily.    IF you received an x-ray today, you will receive an invoice from [facility]. Please contact [facility] at [phone] with questions or concerns regarding your invoice.   IF you received labwork today, you will receive an invoice from [facility]. Please contact [facility] at [phone] with questions or concerns regarding your invoice.   Our billing staff will not be able to assist you with questions regarding bills from these companies.  You will be contacted with the lab results as soon as they are available. The fastest way to get your results is to activate your My Chart account. Instructions are located on the last page of this paperwork. If you have not heard from us regarding the results in 2 weeks, please contact this office.      

## 2017-03-02 NOTE — Progress Notes (Signed)
Patient ID: Angela Morgan, female    DOB: 1970/12/28, 46 y.o.   MRN: [id number]  PCP: [name], MD  Chief Complaint  Patient presents with  . Sinusitis  . URI    Subjective:   Presents for evaluation of possible sinusitis.  Symptoms began 02/25/2017. Started out with increased phlegm, and thought it may be IBS, trying get those symptoms back under control after a mild flare. Scratchy throat, coughing, feels run down. Cough keeping her awake. Today head pressure. Lots of nasal congestion.  Just moved. Previous home had a lot of smoke in it, which she is allergic to. She took Claritin for it, and it helped, but isn't helping this.  Review of Systems As above.    Patient Active Problem List   Diagnosis Date Noted  . Migraine, unspecified, without mention of intractable migraine without mention of status migrainosus 03/15/2013  . Premature menopause on HRT 01/09/2013  . 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 Depression with anxiety 08/04/2012  . Insomnia 08/04/2012     Prior to Admission medications   Medication Sig Start Date End Date Taking? Authorizing Provider  estradiol (ESTRACE) 1 MG tablet Take 1 mg by mouth daily.   Yes Historical Provider, MD  ibuprofen (ADVIL,MOTRIN) 600 MG tablet Take 1 tablet (600 mg total) by mouth every 6 (six) hours as needed. 09/08/14  Yes Junius Creamer, NP  Multiple Vitamin (MULTIVITAMIN) tablet Take 1 tablet by mouth daily.   Yes Historical Provider, MD  NON FORMULARY 1 suppository by Other route every other day. Estrogen/testosterone suppository/ pill every other day.   Yes Historical Provider, MD  pantoprazole (PROTONIX) 40 MG tablet Take 40 mg by mouth daily.   Yes Historical Provider, MD  predniSONE (DELTASONE) 20 MG tablet Take 3 PO QAM x 1 day, 2 PO QAM x 5 days, 1 PO QAM x 5 days 10/20/16  Yes Wardell Honour, MD  Progesterone Micronized (PROGESTERONE PO) Take 75 mg by mouth daily.   Yes Historical Provider, MD  ranitidine  (ZANTAC) 150 MG tablet Take 150 mg by mouth 2 (two) times daily.   Yes Historical Provider, MD  VITAMIN D, CHOLECALCIFEROL, PO Take by mouth daily.   Yes Historical Provider, MD     Allergies  Allergen Reactions  . Biaxin [Clarithromycin] Anaphylaxis       Objective:  Physical Exam  Constitutional: She is oriented to person, place, and time. She appears well-developed and well-nourished. She is active and cooperative. No distress.  BP 107/72   Pulse 63   Temp 98.6 F (37 C) (Oral)   Resp 17   Ht 5' 4.5" (1.638 m)   Wt 145 lb (65.8 kg)   SpO2 97%   BMI 24.50 kg/m   HENT:  Head: Normocephalic and atraumatic.  Right Ear: Hearing, tympanic membrane, external ear and ear canal normal.  Left Ear: Hearing, tympanic membrane, external ear and ear canal normal.  Nose: Mucosal edema present. No rhinorrhea, nose lacerations, sinus tenderness, nasal deformity, septal deviation or nasal septal hematoma. No epistaxis.  No foreign bodies. Right sinus exhibits no maxillary sinus tenderness and no frontal sinus tenderness. Left sinus exhibits no maxillary sinus tenderness and no frontal sinus tenderness.  Mouth/Throat: Uvula is midline, oropharynx is clear and moist and mucous membranes are normal. No oropharyngeal exudate.  Eyes: Conjunctivae are normal. No scleral icterus.  Neck: Normal range of motion. Neck supple. No thyromegaly present.  Cardiovascular: Normal rate, regular rhythm and normal heart sounds.  Pulses:      Radial pulses are 2+ on the right side, and 2+ on the left side.  Pulmonary/Chest: Effort normal and breath sounds normal.  Lymphadenopathy:       Head (right side): No tonsillar, no preauricular, no posterior auricular and no occipital adenopathy present.       Head (left side): No tonsillar, no preauricular, no posterior auricular and no occipital adenopathy present.    She has no cervical adenopathy.       Right: No supraclavicular adenopathy present.       Left: No  supraclavicular adenopathy present.  Neurological: She is alert and oriented to person, place, and time. No sensory deficit.  Skin: Skin is warm, dry and intact. No rash noted. No cyanosis or erythema. Nails show no clubbing.  Psychiatric: She has a normal mood and affect. Her speech is normal and behavior is normal.           Assessment & Plan:   1. Acute recurrent maxillary sinusitis Supportive care.  Anticipatory guidance.  RTC if symptoms worsen/persist. - amoxicillin-clavulanate (AUGMENTIN) 875-125 MG tablet; Take 1 tablet by mouth 2 (two) times daily.  Dispense: 20 tablet; Refill: 0 - Guaifenesin (MUCINEX MAXIMUM STRENGTH) 1200 MG TB12; Take 1 tablet (1,200 mg total) by mouth every 12 (twelve) hours as needed.  Dispense: 14 tablet; Refill: 1 - benzonatate (TESSALON) 100 MG capsule; Take 1-2 capsules (100-200 mg total) by mouth 3 (three) times daily as needed for cough.  Dispense: 40 capsule; Refill: 0   Fara Chute, PA-C Physician Assistant-Certified [facility] at [place]

## 2017-03-08 ENCOUNTER — Encounter: Payer: Self-pay | Admitting: Family Medicine

## 2017-03-08 ENCOUNTER — Ambulatory Visit (INDEPENDENT_AMBULATORY_CARE_PROVIDER_SITE_OTHER): Payer: BC Managed Care – PPO | Admitting: Family Medicine

## 2017-03-08 VITALS — BP 112/73 | HR 59 | Temp 98.2°F | Resp 16 | Ht 64.5 in | Wt 141.0 lb

## 2017-03-08 DIAGNOSIS — Z13220 Encounter for screening for lipoid disorders: Secondary | ICD-10-CM

## 2017-03-08 DIAGNOSIS — E28319 Asymptomatic premature menopause: Secondary | ICD-10-CM

## 2017-03-08 DIAGNOSIS — F5104 Psychophysiologic insomnia: Secondary | ICD-10-CM | POA: Diagnosis not present

## 2017-03-08 DIAGNOSIS — K219 Gastro-esophageal reflux disease without esophagitis: Secondary | ICD-10-CM | POA: Diagnosis not present

## 2017-03-08 DIAGNOSIS — Z0000 Encounter for general adult medical examination without abnormal findings: Secondary | ICD-10-CM

## 2017-03-08 DIAGNOSIS — Z79890 Hormone replacement therapy (postmenopausal): Secondary | ICD-10-CM | POA: Diagnosis not present

## 2017-03-08 DIAGNOSIS — G43709 Chronic migraine without aura, not intractable, without status migrainosus: Secondary | ICD-10-CM

## 2017-03-08 DIAGNOSIS — Z131 Encounter for screening for diabetes mellitus: Secondary | ICD-10-CM | POA: Diagnosis not present

## 2017-03-08 DIAGNOSIS — J069 Acute upper respiratory infection, unspecified: Secondary | ICD-10-CM | POA: Diagnosis not present

## 2017-03-08 MED ORDER — HYDROCOD POLST-CPM POLST ER 10-8 MG/5ML PO SUER: 5.0000 mL | Freq: Two times a day (BID) | ORAL | 0 refills | Status: DC | PRN

## 2017-03-08 MED ORDER — PANTOPRAZOLE SODIUM 40 MG PO TBEC: 40.0000 mg | DELAYED_RELEASE_TABLET | Freq: Every day | ORAL | 3 refills | Status: DC

## 2017-03-08 MED ORDER — RANITIDINE HCL 150 MG PO TABS: 150.0000 mg | ORAL_TABLET | Freq: Two times a day (BID) | ORAL | 3 refills | Status: DC

## 2017-03-08 NOTE — Patient Instructions (Addendum)
Angela Morgan of [place] on [address] [place] on [address]   IF you received an x-ray today, you will receive an invoice from [facility]. Please contact [facility] at [phone] with questions or concerns regarding your invoice.   IF you received labwork today, you will receive an invoice from [facility]. Please contact [facility] at [phone] with questions or concerns regarding your invoice.   Our billing staff will not be able to assist you with questions regarding bills from these companies.  You will be contacted with the lab results as soon as they are available. The fastest way to get your results is to activate your My Chart account. Instructions are located on the last page of this paperwork. If you have not heard from [place] regarding the results in 2 weeks, please contact this office.      Preventive Care 40-64 Years, Female Preventive care refers to lifestyle choices and visits with your health care provider that can promote health and wellness. What does preventive care include?  A yearly physical exam. This is also called an annual well check.  Dental exams once or twice a year.  Routine eye exams. Ask your health care provider how often you should have your eyes checked.  Personal lifestyle choices, including:  Daily care of your teeth and gums.  Regular physical activity.  Eating a healthy diet.  Avoiding tobacco and drug use.  Limiting alcohol use.  Practicing safe sex.  Taking low-dose aspirin daily starting at age 68.  Taking vitamin and mineral supplements as recommended by your health care provider. What happens during an annual well check? The services and screenings done by your health care provider during your annual well check will depend on your age, overall health, lifestyle risk factors, and family history of disease. Counseling  Your health care provider may ask you questions about your:  Alcohol  use.  Tobacco use.  Drug use.  Emotional well-being.  Home and relationship well-being.  Sexual activity.  Eating habits.  Work and work Statistician.  Method of birth control.  Menstrual cycle.  Pregnancy history. Screening  You may have the following tests or measurements:  Height, weight, and BMI.  Blood pressure.  Lipid and cholesterol levels. These may be checked every 5 years, or more frequently if you are over 59 years old.  Skin check.  Lung cancer screening. You may have this screening every year starting at age 53 if you have a 30-pack-year history of smoking and currently smoke or have quit within the past 15 years.  Fecal occult blood test (FOBT) of the stool. You may have this test every year starting at age 15.  Flexible sigmoidoscopy or colonoscopy. You may have a sigmoidoscopy every 5 years or a colonoscopy every 10 years starting at age 10.  Hepatitis C blood test.  Hepatitis B blood test.  Sexually transmitted disease (STD) testing.  Diabetes screening. This is done by checking your blood sugar (glucose) after you have not eaten for a while (fasting). You may have this done every 1-3 years.  Mammogram. This may be done every 1-2 years. Talk to your health care provider about when you should start having regular mammograms. This may depend on whether you have a family history of breast cancer.  BRCA-related cancer screening. This may be done if you have a family history of breast, ovarian, tubal, or peritoneal cancers.  Pelvic exam and Pap test. This may be done every 3 years starting at age 93.  Starting at age 90, this may be done every 5 years if you have a Pap test in combination with an HPV test.  Bone density scan. This is done to screen for osteoporosis. You may have this scan if you are at high risk for osteoporosis. Discuss your test results, treatment options, and if necessary, the need for more tests with your health care  provider. Vaccines  Your health care provider may recommend certain vaccines, such as:  Influenza vaccine. This is recommended every year.  Tetanus, diphtheria, and acellular pertussis (Tdap, Td) vaccine. You may need a Td booster every 10 years.  Varicella vaccine. You may need this if you have not been vaccinated.  Zoster vaccine. You may need this after age 40.  Measles, mumps, and rubella (MMR) vaccine. You may need at least one dose of MMR if you were born in 1957 or later. You may also need a second dose.  Pneumococcal 13-valent conjugate (PCV13) vaccine. You may need this if you have certain conditions and were not previously vaccinated.  Pneumococcal polysaccharide (PPSV23) vaccine. You may need one or two doses if you smoke cigarettes or if you have certain conditions.  Meningococcal vaccine. You may need this if you have certain conditions.  Hepatitis A vaccine. You may need this if you have certain conditions or if you travel or work in places where you may be exposed to hepatitis A.  Hepatitis B vaccine. You may need this if you have certain conditions or if you travel or work in places where you may be exposed to hepatitis B.  Haemophilus influenzae type b (Hib) vaccine. You may need this if you have certain conditions. Talk to your health care provider about which screenings and vaccines you need and how often you need them. This information is not intended to replace advice given to you by your health care provider. Make sure you discuss any questions you have with your health care provider. Document Released: 12/19/2015 Document Revised: 08/11/2016 Document Reviewed: 09/23/2015 Elsevier Interactive Patient Education  2017 [employer].

## 2017-03-08 NOTE — Progress Notes (Signed)
Subjective:    Patient ID: Angela Morgan, female    DOB: 03/19/1971, 46 y.o.   MRN: [id number]  03/08/2017  Annual Exam   HPI This 46 y.o. female presents for Complete Physical Examination.  Last physical: 03-02-2016 Pap smear:  08-07-2015; repeated in 01/2017; Angela Morgan. Mammogram:  01-28-2017 Colonoscopy:  2008 Eye exam: Dental exam:   Visual Acuity Screening   Right eye Left eye Both eyes  Without correction:     With correction: 20/20 20/15 20/15     Immunization History  Administered Date(s) Administered  . Influenza Split 09/04/2012  . Influenza,inj,Quad PF,36+ Mos 12/26/2014, 10/20/2015  . Influenza-Unspecified 09/05/2013  . PPD Test 01/07/2014  . Tdap 01/06/2011   BP Readings from Last 3 Encounters:  03/08/17 112/73  03/02/17 107/72  10/20/16 124/70   Wt Readings from Last 3 Encounters:  03/08/17 141 lb (64 kg)  03/02/17 145 lb (65.8 kg)  10/20/16 142 lb (64.4 kg)   Coughing: cannot sleep well due to cough; treated for sinusitis by PA Angela Morgan last week; went non-stop all weekend.  Really tired.  Body yesterday was exhausted.  Could not go to yoga.  Last night took Nyquil and 1/2 Klonopin.  Slept all night; feels really groggy this morning.  Requesting cough medication.  Review of Systems  Constitutional: Negative for activity change, appetite change, chills, diaphoresis, fatigue, fever and unexpected weight change.  HENT: Positive for congestion, sinus pain and sinus pressure. Negative for dental problem, drooling, ear discharge, ear pain, facial swelling, hearing loss, mouth sores, nosebleeds, postnasal drip, rhinorrhea, sneezing, sore throat, tinnitus, trouble swallowing and voice change.   Eyes: Negative for photophobia, pain, discharge, redness, itching and visual disturbance.  Respiratory: Positive for cough. Negative for apnea, choking, chest tightness, shortness of breath, wheezing and stridor.   Cardiovascular: Negative for chest pain, palpitations  and leg swelling.  Gastrointestinal: Positive for abdominal pain. Negative for abdominal distention, anal bleeding, blood in stool, constipation, diarrhea, nausea, rectal pain and vomiting.  Endocrine: Negative for cold intolerance, heat intolerance, polydipsia, polyphagia and polyuria.  Genitourinary: Negative for decreased urine volume, difficulty urinating, dyspareunia, dysuria, enuresis, flank pain, frequency, genital sores, hematuria, menstrual problem, pelvic pain, urgency, vaginal bleeding, vaginal discharge and vaginal pain.       Nocturia x 0.  Rare urinary leakage.  Sneezing.  Musculoskeletal: Negative for arthralgias, back pain, gait problem, joint swelling, myalgias, neck pain and neck stiffness.  Skin: Negative for color change, pallor, rash and wound.  Allergic/Immunologic: Negative for environmental allergies, food allergies and immunocompromised state.  Neurological: Negative for dizziness, tremors, seizures, syncope, facial asymmetry, speech difficulty, weakness, light-headedness, numbness and headaches.  Hematological: Negative for adenopathy. Does not bruise/bleed easily.  Psychiatric/Behavioral: Negative for agitation, behavioral problems, confusion, decreased concentration, dysphoric mood, hallucinations, self-injury, sleep disturbance and suicidal ideas. The patient is not nervous/anxious and is not hyperactive.        Bedtime 10:30-11:00pm; wakes up 6:30am.    Past Medical History:  Diagnosis Date  . Adjustment disorder with depressed mood   . Allergic rhinitis, cause unspecified   . Allergy   . Anxiety   . Arthropathy, unspecified, site unspecified   . Cellulitis 11/2007   Orbital Shingles   Hospitalized  . Depression   . Depressive disorder, not elsewhere classified   . Fibromyalgia   . GERD (gastroesophageal reflux disease)   . Insomnia, unspecified   . Irritable bowel syndrome   . Migraine, unspecified, without mention of intractable migraine without mention of  status migrainosus   . Postablative ovarian failure   . Situational, disturbance, acute   . Symptomatic states associated with artificial menopause   . Urticaria, unspecified   . Vestibular dysfunction   . Vestibular neuritis    Past Surgical History:  Procedure Laterality Date  . APPENDECTOMY    . BREAST BIOPSY Left 2015 x2   NEG  . CESAREAN SECTION     x 2  . CHOLECYSTECTOMY    . ESOPHAGOGASTRODUODENOSCOPY (EGD) WITH PROPOFOL N/A 01/12/2016   Procedure: ESOPHAGOGASTRODUODENOSCOPY (EGD) WITH PROPOFOL;  Surgeon: Angela Luster, MD;  Location: [facility] ENDOSCOPY;  Service: Gastroenterology;  Laterality: N/A;  . KNEE SURGERY     right  . TEMPOROMANDIBULAR JOINT SURGERY    . TOTAL ABDOMINAL HYSTERECTOMY     ovaries removed   Allergies  Allergen Reactions  . Biaxin [Clarithromycin] Anaphylaxis    Social History   Social History  . Marital status: Married    Spouse name: n/a  . Number of children: 2  . Years of education: college   Occupational History  . 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 Smoking status: Never Smoker  . Smokeless tobacco: Never Used  . Alcohol use 2.4 oz/week    4 Glasses of wine per week  . Drug use: No  . Sexual activity: Yes    Birth control/ protection: Post-menopausal, Surgical   Other Topics Concern  . Not on file   Social History Narrative   Marital Status:  Remarried in 03/2015.  Second marriage.        Children:  2 children (23 Angela Morgan, [place]); no grandchildren.       Living:  living in apartment with husband in [place].  Angela Morgan will be going to [employer] in 2018-2019. Joint custody of Angela Morgan/stepson.    Angela Morgan is in [place].        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      Exercise: exercising sporadically in 2018; yoga and gym once per week.        Always uses seat belts / helmet.       Smoke alarm in the home.       No guns in the home.      Caffeine use: moderate      Education: College                              Family History  Problem Relation Age of Onset  . Diabetes Maternal Grandfather   . Hypertension Mother   . Stroke Mother   . COPD Mother   . Aneurysm Mother     brain  . Transient ischemic attack Mother     multiple  . Hyperlipidemia Mother   . Hypertension Father   . Heart disease Father   . Mental illness Father   . Depression Father   . Hyperlipidemia Father   . Hyperlipidemia Brother   . Hypertension Brother   . Hypertension Brother   . Asthma Son        Objective:    BP 112/73 (BP Location: Right Arm, Patient Position: Sitting, Cuff Size: Small)   Pulse (!) 59   Temp 98.2 F (36.8 C) (Oral)   Resp 16   Ht 5' 4.5" (1.638 m)   Wt 141 lb (64 kg)   SpO2 98%   BMI  23.83 kg/m  Physical Exam  Constitutional: She is oriented to person, place, and time. She appears well-developed and well-nourished. No distress.  HENT:  Head: Normocephalic and atraumatic.  Right Ear: External ear normal.  Left Ear: External ear normal.  Nose: Nose normal.  Mouth/Throat: Oropharynx is clear and moist.  Eyes: Conjunctivae and EOM are normal. Pupils are equal, round, and reactive to light.  Neck: Normal range of motion and full passive range of motion without pain. Neck supple. No JVD present. Carotid bruit is not present. No thyromegaly present.  Cardiovascular: Normal rate, regular rhythm and normal heart sounds.  Exam reveals no gallop and no friction rub.   No murmur heard. Pulmonary/Chest: Effort normal and breath sounds normal. She has no wheezes. She has no rales.  Abdominal: Soft. Bowel sounds are normal. She exhibits no distension and no mass. There is no tenderness. There is no rebound and no guarding.  Musculoskeletal:       Right shoulder: Normal.       Left shoulder: Normal.       Cervical back: Normal.  Lymphadenopathy:    She has no cervical adenopathy.  Neurological: She is alert and  oriented to person, place, and time. She has normal reflexes. No cranial nerve deficit. She exhibits normal muscle tone. Coordination normal.  Skin: Skin is warm and dry. No rash noted. She is not diaphoretic. No erythema. No pallor.  Psychiatric: She has a normal mood and affect. Her behavior is normal. Judgment and thought content normal.  Nursing note and vitals reviewed.   Depression screen [facility] 2/9 03/08/2017 03/08/2017 03/02/2017 10/20/2016 06/23/2016  Decreased Interest 0 0 0 0 0  Down, Depressed, Hopeless 0 0 0 0 0  PHQ - 2 Score 0 0 0 0 0   Fall Risk  03/08/2017 03/08/2017 03/02/2017 10/20/2016 06/23/2016  Falls in the past year? No No No No Yes  Number falls in past yr: - - - - 1  Injury with Fall? - - - - Yes       Assessment & Plan:   1. Routine physical examination   2. Chronic migraine without aura without status migrainosus, not intractable   3. Premature menopause on HRT   4. Psychophysiological insomnia   5. Screening, lipid   6. Screening for diabetes mellitus   7. Gastroesophageal reflux disease without esophagitis   8. Acute upper respiratory infection    -anticipatory guidance --- ongoing exercise, weight maintenance, 3 servings of dairy daily. -obtain age appropriate screening labs. -migraines are stable at this time. -GERD is stable; refills provided; pt desires to establish with a local gastroenterologist since moving to [place].  Referral placed. -rx for Tussionex provided for acute URI.  Supportive care with rest, fluids, Tylenol and or Motrin.   Orders Placed This Encounter  Procedures  . CBC with Differential/Platelet  . Comprehensive metabolic panel    Order Specific Question:   Has the patient fasted?    Answer:   Yes  . Hemoglobin A1c  . Lipid panel    Order Specific Question:   Has the patient fasted?    Answer:   Yes  . TSH  . Ambulatory referral to Gastroenterology    Referral Priority:   Routine    Referral Type:   Consultation    Referral Reason:    Specialty Services Required    Referred to Provider:   Gatha Mayer, MD    Number of Visits Requested:   1  . POCT  urinalysis dipstick   Meds ordered this encounter  Medications  . ranitidine (ZANTAC) 150 MG tablet    Sig: Take 1 tablet (150 mg total) by mouth 2 (two) times daily.    Dispense:  180 tablet    Refill:  3  . pantoprazole (PROTONIX) 40 MG tablet    Sig: Take 1 tablet (40 mg total) by mouth daily.    Dispense:  90 tablet    Refill:  3  . chlorpheniramine-HYDROcodone (TUSSIONEX PENNKINETIC ER) 10-8 MG/5ML SUER    Sig: Take 5 mLs by mouth every 12 (twelve) hours as needed for cough.    Dispense:  120 mL    Refill:  0    Return in about 1 year (around 03/08/2018) for complete physical examiniation.   Kristi Elayne Guerin, M.D. Primary Care at [facility] previously [facility] [place] [address] [place],   [address] [phone] phone [phone] fax

## 2017-03-09 LAB — CBC WITH DIFFERENTIAL/PLATELET
BASOS: 2 %
Basophils Absolute: 0.1 10*3/uL (ref 0.0–0.2)
EOS (ABSOLUTE): 0.2 10*3/uL (ref 0.0–0.4)
Eos: 6 %
HEMOGLOBIN: 13.3 g/dL (ref 11.1–15.9)
Hematocrit: 39.4 % (ref 34.0–46.6)
IMMATURE GRANS (ABS): 0 10*3/uL (ref 0.0–0.1)
IMMATURE GRANULOCYTES: 0 %
LYMPHS: 41 %
Lymphocytes Absolute: 1.7 10*3/uL (ref 0.7–3.1)
MCH: 30.1 pg (ref 26.6–33.0)
MCHC: 33.8 g/dL (ref 31.5–35.7)
MCV: 89 fL (ref 79–97)
MONOCYTES: 9 %
Monocytes Absolute: 0.3 10*3/uL (ref 0.1–0.9)
NEUTROS ABS: 1.6 10*3/uL (ref 1.4–7.0)
NEUTROS PCT: 42 %
Platelets: 244 10*3/uL (ref 150–379)
RBC: 4.42 x10E6/uL (ref 3.77–5.28)
RDW: 12.3 % (ref 12.3–15.4)
WBC: 3.9 10*3/uL (ref 3.4–10.8)

## 2017-03-09 LAB — COMPREHENSIVE METABOLIC PANEL
A/G RATIO: 2 (ref 1.2–2.2)
ALBUMIN: 4.3 g/dL (ref 3.5–5.5)
ALT: 14 IU/L (ref 0–32)
AST: 21 IU/L (ref 0–40)
Alkaline Phosphatase: 62 IU/L (ref 39–117)
BILIRUBIN TOTAL: 0.6 mg/dL (ref 0.0–1.2)
BUN/Creatinine Ratio: 16 (ref 9–23)
BUN: 13 mg/dL (ref 6–24)
CALCIUM: 9.1 mg/dL (ref 8.7–10.2)
CO2: 22 mmol/L (ref 18–29)
CREATININE: 0.79 mg/dL (ref 0.57–1.00)
Chloride: 101 mmol/L (ref 96–106)
GFR, EST AFRICAN AMERICAN: 105 mL/min/{1.73_m2} (ref >59)
GFR, EST NON AFRICAN AMERICAN: 91 mL/min/{1.73_m2} (ref >59)
GLUCOSE: 87 mg/dL (ref 65–99)
Globulin, Total: 2.1 g/dL (ref 1.5–4.5)
Potassium: 4.1 mmol/L (ref 3.5–5.2)
SODIUM: 141 mmol/L (ref 134–144)
Total Protein: 6.4 g/dL (ref 6.0–8.5)

## 2017-03-09 LAB — LIPID PANEL
CHOLESTEROL TOTAL: 125 mg/dL (ref 100–199)
Chol/HDL Ratio: 2.6 ratio (ref 0.0–4.4)
HDL: 48 mg/dL (ref >39)
LDL Calculated: 64 mg/dL (ref 0–99)
Triglycerides: 64 mg/dL (ref 0–149)
VLDL CHOLESTEROL CAL: 13 mg/dL (ref 5–40)

## 2017-03-09 LAB — HEMOGLOBIN A1C
ESTIMATED AVERAGE GLUCOSE: 91 mg/dL
Hgb A1c MFr Bld: 4.8 % (ref 4.8–5.6)

## 2017-03-09 LAB — TSH: TSH: 0.818 u[IU]/mL (ref 0.450–4.500)

## 2017-03-14 ENCOUNTER — Telehealth: Payer: Self-pay | Admitting: Family Medicine

## 2017-03-14 NOTE — Telephone Encounter (Signed)
See all normal labs

## 2017-03-14 NOTE — Telephone Encounter (Signed)
Pt had a physical on 03/08/17 and is stating that she feels really run down still and is not sure what is going on   No matter what amount of sleep she gets she is tired and she has finished the medicaitons that could make her tired as well  Best number [phone] ext [phone] til [phone] and then cell phone [phone]

## 2017-03-15 NOTE — Telephone Encounter (Signed)
Call --- all labs are normal.  Thyroid is normal.  Sugar is normal. No evidence of anemia.  With recent illness and recent move, I would allow body another week to recover.

## 2017-03-16 NOTE — Telephone Encounter (Signed)
l/m with dr Darliss Ridgel note

## 2017-04-03 ENCOUNTER — Encounter: Payer: Self-pay | Admitting: Family Medicine

## 2017-04-14 ENCOUNTER — Encounter: Payer: Self-pay | Admitting: Family Medicine

## 2017-04-14 ENCOUNTER — Encounter: Payer: Self-pay | Admitting: Internal Medicine

## 2017-04-14 ENCOUNTER — Ambulatory Visit (INDEPENDENT_AMBULATORY_CARE_PROVIDER_SITE_OTHER): Payer: BC Managed Care – PPO | Admitting: Family Medicine

## 2017-04-14 ENCOUNTER — Telehealth: Payer: Self-pay | Admitting: Family Medicine

## 2017-04-14 VITALS — BP 113/75 | HR 63 | Temp 98.8°F | Ht 64.5 in | Wt 145.0 lb

## 2017-04-14 DIAGNOSIS — J301 Allergic rhinitis due to pollen: Secondary | ICD-10-CM

## 2017-04-14 DIAGNOSIS — R5383 Other fatigue: Secondary | ICD-10-CM | POA: Diagnosis not present

## 2017-04-14 DIAGNOSIS — J0101 Acute recurrent maxillary sinusitis: Secondary | ICD-10-CM | POA: Diagnosis not present

## 2017-04-14 LAB — POCT URINALYSIS DIP (MANUAL ENTRY)
BILIRUBIN UA: NEGATIVE
GLUCOSE UA: NEGATIVE mg/dL
Ketones, POC UA: NEGATIVE mg/dL
LEUKOCYTES UA: NEGATIVE
Nitrite, UA: NEGATIVE
Protein Ur, POC: NEGATIVE mg/dL
Spec Grav, UA: <=1.005 — AB (ref 1.010–1.025)
Urobilinogen, UA: 0.2 E.U./dL
pH, UA: 5.5 (ref 5.0–8.0)

## 2017-04-14 LAB — POCT CBC
Granulocyte percent: 54.7 %G (ref 37–80)
HEMATOCRIT: 40.1 % (ref 37.7–47.9)
Hemoglobin: 14 g/dL (ref 12.2–16.2)
LYMPH, POC: 1.8 (ref 0.6–3.4)
MCH: 30.7 pg (ref 27–31.2)
MCHC: 35 g/dL (ref 31.8–35.4)
MCV: 87.9 fL (ref 80–97)
MID (CBC): 0.2 (ref 0–0.9)
MPV: 7.3 fL (ref 0–99.8)
POC GRANULOCYTE: 2.5 (ref 2–6.9)
POC LYMPH PERCENT: 40.6 %L (ref 10–50)
POC MID %: 4.7 % (ref 0–12)
Platelet Count, POC: 267 10*3/uL (ref 142–424)
RBC: 4.56 M/uL (ref 4.04–5.48)
RDW, POC: 12.2 %
WBC: 4.5 10*3/uL — AB (ref 4.6–10.2)

## 2017-04-14 MED ORDER — PREDNISONE 20 MG PO TABS: ORAL_TABLET | ORAL | 0 refills | Status: DC

## 2017-04-14 MED ORDER — AZITHROMYCIN 250 MG PO TABS: ORAL_TABLET | ORAL | 0 refills | Status: DC

## 2017-04-14 NOTE — Telephone Encounter (Signed)
[facility] is calling to clarify the rx for azithromycin that Dr. Tamala Julian dispensed today   Best number [phone]

## 2017-04-14 NOTE — Patient Instructions (Addendum)
   CALL ME IN TWO WEEKS IF Y OUR ARE NOT 75% IMPROVED.   Fatigue Fatigue is feeling tired all of the time, a lack of energy, or a lack of motivation. Occasional or mild fatigue is often a normal response to activity or life in general. However, long-lasting (chronic) or extreme fatigue may indicate an underlying medical condition. Follow these instructions at home: Watch your fatigue for any changes. The following actions may help to lessen any discomfort you are feeling:  Talk to your health care provider about how much sleep you need each night. Try to get the required amount every night.  Take medicines only as directed by your health care provider.  Eat a healthy and nutritious diet. Ask your health care provider if you need help changing your diet.  Drink enough fluid to keep your urine clear or pale yellow.  Practice ways of relaxing, such as yoga, meditation, massage therapy, or acupuncture.  Exercise regularly.  Change situations that cause you stress. Try to keep your work and personal routine reasonable.  Do not abuse illegal drugs.  Limit alcohol intake to no more than 1 drink per day for nonpregnant women and 2 drinks per day for men. One drink equals 12 ounces of beer, 5 ounces of wine, or 1 ounces of hard liquor.  Take a multivitamin, if directed by your health care provider. Contact a health care provider if:  Your fatigue does not get better.  You have a fever.  You have unintentional weight loss or gain.  You have headaches.  You have difficulty:  Falling asleep.  Sleeping throughout the night.  You feel angry, guilty, anxious, or sad.  You are unable to have a bowel movement (constipation).  You skin is dry.  Your legs or another part of your body is swollen. Get help right away if:  You feel confused.  Your vision is blurry.  You feel faint or pass out.  You have a severe headache.  You have severe abdominal, pelvic, or back pain.  You  have chest pain, shortness of breath, or an irregular or fast heartbeat.  You are unable to urinate or you urinate less than normal.  You develop abnormal bleeding, such as bleeding from the rectum, vagina, nose, lungs, or nipples.  You vomit blood.  You have thoughts about harming yourself or committing suicide.  You are worried that you might harm someone else. This information is not intended to replace advice given to you by your health care provider. Make sure you discuss any questions you have with your health care provider. Document Released: 09/19/2007 Document Revised: 04/29/2016 Document Reviewed: 03/26/2014 Elsevier Interactive Patient Education  2017 [employer].   IF you received an x-ray today, you will receive an invoice from [facility]. Please contact [facility] Rad[facility] at [phone] with questions or concerns regarding your invoice.   IF you received labwork today, you will receive an invoice from [facility]. Please contact [facility] at [phone] with questions or concerns regarding your invoice.   Our billing staff will not be able to assist you with questions regarding bills from these companies.  You will be contacted with the lab results as soon as they are available. The fastest way to get your results is to activate your My Chart account. Instructions are located on the last page of this paperwork. If you have not heard from [place] regarding the results in 2 weeks, please contact this office.

## 2017-04-14 NOTE — Progress Notes (Signed)
Subjective:    Patient ID: Angela Morgan, female    DOB: Mar 19, 1971, 46 y.o.   MRN: [id number]  04/14/2017  Headache (x 3 weeks) and Fatigue (x 1 month)   HPI This 46 y.o. female presents for evaluation of excessive fatigue.  Has not been able to shake the fatigue since 03/10/2017.  Has started going to gym with son twice weekly in [place].  Has been going to apartment gym several times.    Last night did box jumps; has been doing box jumps for years.  Last night on third set of box jumps and unable to do it.  Unable to connect with lower half of body to finish box jumps.  Headaches; not sure if allergies; pollen is terrible.  Not migraines; these are different.  Frontal and posterior neck.  Frontal region and maxillary region. Has been taking Clartin and FLonase; has nto has alelrgies this bad in twenty years.  Has eye draiange; horrible bags under eyes.  Moderate headache; severity at worst 7-8/10.  Taking Ibuprofen with some relief.    Going to bed at 10:30-11:00; sleeping really well.  At 11:00am, exhuasted.  Watching diet.  Minimal sugar intake.     Review of Systems  Constitutional: Positive for fatigue. Negative for chills, diaphoresis and fever.  Eyes: Positive for discharge. Negative for photophobia, pain, redness, itching and visual disturbance.  Respiratory: Negative for cough and shortness of breath.   Cardiovascular: Negative for chest pain, palpitations and leg swelling.  Gastrointestinal: Negative for abdominal pain, constipation, diarrhea, nausea and vomiting.  Endocrine: Negative for cold intolerance, heat intolerance, polydipsia, polyphagia and polyuria.  Neurological: Positive for headaches. Negative for dizziness, tremors, seizures, syncope, facial asymmetry, speech difficulty, weakness, light-headedness and numbness.    Past Medical History:  Diagnosis Date  . Allergic rhinitis, cause unspecified   . Allergy   . Anxiety   . Depression   . Depressive  disorder, not elsewhere classified   . Fibromyalgia   . GERD (gastroesophageal reflux disease)   . Insomnia, unspecified   . Irritable bowel syndrome   . Migraine, unspecified, without mention of intractable migraine without mention of status migrainosus   . Postablative ovarian failure   . Symptomatic states associated with artificial menopause   . Vestibular dysfunction   . Vestibular neuritis    Past Surgical History:  Procedure Laterality Date  . APPENDECTOMY    . BREAST BIOPSY Left 2015 x2   NEG  . CESAREAN SECTION     x 2  . CHOLECYSTECTOMY    . ESOPHAGOGASTRODUODENOSCOPY (EGD) WITH PROPOFOL N/A 01/12/2016   Procedure: ESOPHAGOGASTRODUODENOSCOPY (EGD) WITH PROPOFOL;  Surgeon: Hulen Luster, MD;  Location: [facility] ENDOSCOPY;  Service: Gastroenterology;  Laterality: N/A;  . KNEE SURGERY     right  . TEMPOROMANDIBULAR JOINT SURGERY    . TOTAL ABDOMINAL HYSTERECTOMY     ovaries removed   Allergies  Allergen Reactions  . Biaxin [Clarithromycin] Anaphylaxis    Social History   Social History  . Marital status: Married    Spouse name: n/a  . Number of children: 2  . Years of education: college   Occupational History  . 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 Smoking status: Never Smoker  . Smokeless tobacco: Never Used  . Alcohol use 2.4 oz/week    4 Glasses of wine per week  . Drug use: No  . Sexual activity: Yes  Birth control/ protection: Post-menopausal, Surgical   Other Topics Concern  . Not on file   Social History Narrative   Marital Status:  Remarried in 03/2015.  Second marriage.        Children:  2 children ([name], [place]); no grandchildren.       Living:  living in apartment with husband in [place].  [name] will be going to [employer] in 2018-2019. Joint custody of Ian/stepson.    [name] is in [place].        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       Exercise: exercising sporadically in 2018; yoga and gym once per week.        Always uses seat belts / helmet.       Smoke alarm in the home.       No guns in the home.     Caffeine use: moderate      Education: College                              Family History  Problem Relation Age of Onset  . Diabetes Maternal Grandfather   . Hypertension Mother   . Stroke Mother   . COPD Mother   . Aneurysm Mother        brain  . Transient ischemic attack Mother        multiple  . Hyperlipidemia Mother   . Hypertension Father   . Heart disease Father   . Mental illness Father   . Depression Father   . Hyperlipidemia Father   . Hyperlipidemia Brother   . Hypertension Brother   . Hypertension Brother   . Asthma Son        Objective:    BP 113/75 (BP Location: Right Arm, Patient Position: Sitting, Cuff Size: Large)   Pulse 63   Temp 98.8 F (37.1 C) (Oral)   Ht 5' 4.5" (1.638 m)   Wt 145 lb (65.8 kg)   SpO2 96%   BMI 24.50 kg/m  Physical Exam  Constitutional: She is oriented to person, place, and time. She appears well-developed and well-nourished. No distress.  HENT:  Head: Normocephalic and atraumatic.  Right Ear: Tympanic membrane, external ear and ear canal normal. No decreased hearing is noted.  Left Ear: Tympanic membrane, external ear and ear canal normal.  Nose: Right sinus exhibits maxillary sinus tenderness. Right sinus exhibits no frontal sinus tenderness. Left sinus exhibits maxillary sinus tenderness. Left sinus exhibits no frontal sinus tenderness.  Mouth/Throat: Uvula is midline, oropharynx is clear and moist and mucous membranes are normal.  Eyes: Conjunctivae and EOM are normal. Pupils are equal, round, and reactive to light.  Neck: Normal range of motion. Neck supple. Carotid bruit is not present. No thyromegaly present.  Cardiovascular: Normal rate, regular rhythm, normal heart sounds and intact distal pulses.  Exam reveals no gallop and no friction  rub.   No murmur heard. Pulmonary/Chest: Effort normal and breath sounds normal. She has no wheezes. She has no rales.  Abdominal: Soft. Bowel sounds are normal. She exhibits no distension and no mass. There is no tenderness. There is no rebound and no guarding.  Lymphadenopathy:    She has no cervical adenopathy.  Neurological: She is alert and oriented to person, place, and time. No cranial nerve deficit. She exhibits normal muscle tone. Coordination normal.  Skin: Skin is warm and dry. No rash  noted. She is not diaphoretic. No erythema. No pallor.  Psychiatric: She has a normal mood and affect. Her behavior is normal. Judgment and thought content normal.   Depression screen [facility] 2/9 04/14/2017 03/08/2017 03/08/2017 03/02/2017 10/20/2016  Decreased Interest 0 0 0 0 0  Down, Depressed, Hopeless 0 0 0 0 0  PHQ - 2 Score 0 0 0 0 0        Assessment & Plan:   1. Other fatigue   2. Seasonal allergic rhinitis due to pollen   3. Acute recurrent maxillary sinusitis    -persistent excessive fatigue and now associated with moderate headache. -obtain labs. -treat with Zithromax, Prednisone, and Claritin for allergic rhinitis induced sinusitis.   Orders Placed This Encounter  Procedures  . Comprehensive metabolic panel  . Vitamin B12  . VITAMIN D 25 Hydroxy (Vit-D Deficiency, Fractures)  . POCT CBC  . POCT urinalysis dipstick   Meds ordered this encounter  Medications  . loratadine (CLARITIN) 10 MG tablet    Sig: Take 10 mg by mouth daily.  Marland Kitchen azithromycin (ZITHROMAX) 250 MG tablet    Sig: Take 2 tabs PO x 1 dose, then 1 tab PO QD x 4 days    Dispense:  6 tablet    Refill:  0  . predniSONE (DELTASONE) 20 MG tablet    Sig: Take 3 PO QAM x 1 day, 2 PO QAM x 5 days, 1 PO QAM x 5 days    Dispense:  18 tablet    Refill:  0    No Follow-up on file.   Evanna Washinton Elayne Guerin, M.D. Primary Care at [facility] previously Ur[facility] [place] [address] [place], [place]   [phone] phone [phone] fax

## 2017-04-15 ENCOUNTER — Encounter: Payer: Self-pay | Admitting: Family Medicine

## 2017-04-15 ENCOUNTER — Other Ambulatory Visit: Payer: Self-pay

## 2017-04-15 LAB — COMPREHENSIVE METABOLIC PANEL
ALBUMIN: 4.5 g/dL (ref 3.5–5.5)
ALT: 18 IU/L (ref 0–32)
AST: 23 IU/L (ref 0–40)
Albumin/Globulin Ratio: 2 (ref 1.2–2.2)
Alkaline Phosphatase: 77 IU/L (ref 39–117)
BILIRUBIN TOTAL: 0.6 mg/dL (ref 0.0–1.2)
BUN/Creatinine Ratio: 15 (ref 9–23)
BUN: 13 mg/dL (ref 6–24)
CO2: 26 mmol/L (ref 18–29)
CREATININE: 0.89 mg/dL (ref 0.57–1.00)
Calcium: 9.9 mg/dL (ref 8.7–10.2)
Chloride: 102 mmol/L (ref 96–106)
GFR calc non Af Amer: 78 mL/min/{1.73_m2} (ref >59)
GFR, EST AFRICAN AMERICAN: 90 mL/min/{1.73_m2} (ref >59)
GLOBULIN, TOTAL: 2.3 g/dL (ref 1.5–4.5)
Glucose: 83 mg/dL (ref 65–99)
Potassium: 4.2 mmol/L (ref 3.5–5.2)
SODIUM: 144 mmol/L (ref 134–144)
Total Protein: 6.8 g/dL (ref 6.0–8.5)

## 2017-04-15 LAB — VITAMIN B12: Vitamin B-12: 900 pg/mL (ref 232–1245)

## 2017-04-15 LAB — VITAMIN D 25 HYDROXY (VIT D DEFICIENCY, FRACTURES): Vit D, 25-Hydroxy: 55.4 ng/mL (ref 30.0–100.0)

## 2017-04-15 MED ORDER — DOXYCYCLINE HYCLATE 100 MG PO CAPS: 100.0000 mg | ORAL_CAPSULE | Freq: Two times a day (BID) | ORAL | 0 refills | Status: DC

## 2017-04-15 NOTE — Telephone Encounter (Signed)
l/m with rx info, call if concerns

## 2017-04-15 NOTE — Telephone Encounter (Signed)
My chart message from pt:  Pharmacy advised Zithromax is in the same family as Biaxin and pt is severely allergic to Biaxin.  Discussed with Windell Hummingbird in the absence of Dr. Tamala Julian. She ok'd calling in Doxycycline 100mg , 1 tab 2 times per day x 10 days.  Called to [facility].

## 2017-04-19 ENCOUNTER — Encounter: Payer: Self-pay | Admitting: Family Medicine

## 2017-04-19 ENCOUNTER — Other Ambulatory Visit: Payer: Self-pay | Admitting: Obstetrics and Gynecology

## 2017-05-03 ENCOUNTER — Encounter: Payer: Self-pay | Admitting: Family Medicine

## 2017-05-03 ENCOUNTER — Ambulatory Visit (INDEPENDENT_AMBULATORY_CARE_PROVIDER_SITE_OTHER): Payer: BC Managed Care – PPO | Admitting: Family Medicine

## 2017-05-03 VITALS — BP 119/76 | HR 54 | Temp 98.0°F | Resp 16 | Ht 64.5 in | Wt 146.0 lb

## 2017-05-03 DIAGNOSIS — T22211A Burn of second degree of right forearm, initial encounter: Secondary | ICD-10-CM | POA: Diagnosis not present

## 2017-05-03 DIAGNOSIS — G933 Postviral fatigue syndrome: Secondary | ICD-10-CM

## 2017-05-03 DIAGNOSIS — J0101 Acute recurrent maxillary sinusitis: Secondary | ICD-10-CM

## 2017-05-03 DIAGNOSIS — Z23 Encounter for immunization: Secondary | ICD-10-CM | POA: Diagnosis not present

## 2017-05-03 DIAGNOSIS — G9331 Postviral fatigue syndrome: Secondary | ICD-10-CM

## 2017-05-03 MED ORDER — SILVER SULFADIAZINE 1 % EX CREA: 1.0000 "application " | TOPICAL_CREAM | Freq: Every day | CUTANEOUS | 0 refills | Status: DC

## 2017-05-03 MED ORDER — HYDROCODONE-ACETAMINOPHEN 5-325 MG PO TABS: 1.0000 | ORAL_TABLET | Freq: Four times a day (QID) | ORAL | 0 refills | Status: DC | PRN

## 2017-05-03 MED ORDER — AMOXICILLIN-POT CLAVULANATE 875-125 MG PO TABS: 1.0000 | ORAL_TABLET | Freq: Two times a day (BID) | ORAL | 0 refills | Status: DC
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
## 2017-05-03 NOTE — Patient Instructions (Addendum)
IF you received an x-ray today, you will receive an invoice from [facility]. Please contact [facility] at [phone] with questions or concerns regarding your invoice.   IF you received labwork today, you will receive an invoice from [facility]. Please contact [facility] at [phone] with questions or concerns regarding your invoice.   Our billing staff will not be able to assist you with questions regarding bills from these companies.  You will be contacted with the lab results as soon as they are available. The fastest way to get your results is to activate your My Chart account. Instructions are located on the last page of this paperwork. If you have not heard from [place] regarding the results in 2 weeks, please contact this office.      Second-Degree Burn, Adult A second-degree burn, also called a partial thickness wound, is a serious injury that affects the first two layers of skin. A second-degree burn may be minor or major, depending on the size of the burn and which parts of the skin are burned. What are the causes? This condition may be caused by:  Heat. Burns caused by heat happen when skin comes in contact with something very hot, such as a flame or hot liquid.  Radiation. Sources of radiation include sunlight, radiation used to heat food, and radiation treatments.  Electricity. Burns caused by electricity happen when electricity passes through the body from lightning, wiring, electrical outlets, appliances, or power lines.  Certain chemicals, such as acids that come in contact with the skin or eyes. Some chemicals can go through clothing. What increases the risk? This condition is more likely to occur in people who:  Are often exposed to high-risk environments, such as those with open flames, chemicals, or electricity.  Have cancer and are being treated with radiation. What are the signs or symptoms? Symptoms of this condition include:  Severe  pain.  Skin that is deep red, blistered, tender, and swollen.  Skin that has changed color.  Skin that looks blotchy, wet, or shiny. How is this diagnosed? This condition is usually diagnosed with an exam of the wounded area. To get a better look at the wound, your health care provider may remove any blistered skin. It may take several days to find out if you have a second-degree burn because the signs of this kind of burn can take time to develop. You may need to watch the wound for changes at home and visit a health care provider repeatedly to have your wound checked for changes. If the wound is large, you may need to stay in the hospital so a health care team can examine the wound for a few days. How is this treated? Treatment depends on the severity and cause of the burn. Healing may take several weeks. Some second-degree burns, including major burns, electrical burns, and chemical burns, may need to be treated in a hospital. Treatment may involve:  Cooling the burn with cool, germ-free (sterile) water.  Taking or applying medicines, such as:  Medicines to relieve pain or itching.  Ointments to treat or prevent infection.  Antibiotic medicine to treat or prevent infection.  Getting a tetanus shot.  Covering the burn with a bandage (dressing). If your fingers or toes were burned, each of them may be bandaged separately.  Compression dressings to prevent scarring and help the burned body part stay moveable.  Removing dead skin. This is done by a health care provider. Do not try to remove  dead skin yourself. Deep burns can cause skin tissue to die, and a scab (eschar) may form where the skin used to be. If you have a deep burn and an eschar forms, you may need surgery to remove the eschar so the skin can heal properly. If your wound is deep and large (it covers more than 15% of your skin), treatment may also involve:  Receiving fluids and nutrition.  Close monitoring of blood flow  near the wound.  Oxygen given through a mask or a machine (ventilator). This may be needed if a burn causes fluid shifts in the body that make it hard to breathe. Follow these instructions at home: Wound care   Follow instructions from your health care provider about how to take care of your wound. Make sure you:  Wash your hands with soap and water before you change your dressing. If soap and water are not available, use hand sanitizer.  Change your dressing as directed.  If you have a compression dressing, wear it as directed.  Clean your wound 2-3 times a day or as often as directed.  Wash the wound with mild soap and water.  Rinse the wound with water to remove all soap.  Pat the wound dry with a clean towel. Do not rub it.  Check your wound every day for signs of infection. Check for:  More redness, swelling, or pain.  More fluid or blood.  Warmth.  Pus or a bad smell.  Yellow or green fluid.  Do not scratch or pick at the wound  Do not break any blisters or peel any skin.  Avoid exposing your wound to the sun. Medicine   Take and apply over-the-counter and prescription medicines only as told by your health care provider.  Take or apply your antibiotic medicine as told by your health care provider. Do not stop using the antibiotic even if you start to feel better. Eating and drinking   Drink enough fluid to keep your urine clear or pale yellow.  Eat a nutritious diet that is high in protein. This will help your wound to heal. General instructions   Raise (elevate) the injured area above the level of your heart while sitting or lying down.  Rest as directed. Do not exercise until your health care provider approves.  Do not take baths, swim, or use a hot tub until your health care provider approves.  Do not put ice on your burn. This can cause more damage. Try cooling the burn with:  Cool water.  A cool, wet, clean cloth (cool compress).  Keep all  follow-up visits as directed. This is important. Contact a health care provider if:  Your symptoms do not improve with treatment.  Your pain is not relieved with medicine.  You have more redness, swelling, or pain around your wound.  You have more fluid or blood coming from your wound.  You have yellow or green fluid, pus, or a bad smell coming from your wound.  Your wound feels warm to the touch.  You have a fever. Get help right away if:  You develop red streaks near the wound.  You develop severe pain. Summary  A second-degree burn is a serious injury that affects the first two layers of skin.  Clean your wound 2-3 times a day or as often as directed. Check your wound every day for signs of infection.  Do not scratch or pick at your wound, break blisters, peel skin, or put ice on  your burn. This information is not intended to replace advice given to you by your health care provider. Make sure you discuss any questions you have with your health care provider. Document Released: 04/26/2011 Document Revised: 11/02/2016 Document Reviewed: 11/02/2016 Elsevier Interactive Patient Education  2017 [employer].

## 2017-05-26 ENCOUNTER — Telehealth: Payer: Self-pay | Admitting: Internal Medicine

## 2017-05-26 ENCOUNTER — Ambulatory Visit: Payer: BC Managed Care – PPO | Admitting: Internal Medicine

## 2017-05-26 NOTE — Telephone Encounter (Signed)
Thank you :)

## 2017-06-07 ENCOUNTER — Ambulatory Visit (INDEPENDENT_AMBULATORY_CARE_PROVIDER_SITE_OTHER): Payer: BC Managed Care – PPO | Admitting: Physician Assistant

## 2017-06-07 ENCOUNTER — Encounter: Payer: Self-pay | Admitting: Physician Assistant

## 2017-06-07 VITALS — BP 110/73 | HR 61 | Temp 98.2°F | Resp 18 | Ht 64.5 in | Wt 147.0 lb

## 2017-06-07 DIAGNOSIS — R300 Dysuria: Secondary | ICD-10-CM | POA: Diagnosis not present

## 2017-06-07 LAB — POCT URINALYSIS DIP (MANUAL ENTRY)
BILIRUBIN UA: NEGATIVE
BILIRUBIN UA: NEGATIVE mg/dL
GLUCOSE UA: NEGATIVE mg/dL
Leukocytes, UA: NEGATIVE
Nitrite, UA: NEGATIVE
Protein Ur, POC: NEGATIVE mg/dL
SPEC GRAV UA: 1.01 (ref 1.010–1.025)
Urobilinogen, UA: 0.2 E.U./dL
pH, UA: 7 (ref 5.0–8.0)

## 2017-06-07 MED ORDER — SULFAMETHOXAZOLE-TRIMETHOPRIM 800-160 MG PO TABS: 1.0000 | ORAL_TABLET | Freq: Two times a day (BID) | ORAL | 0 refills | Status: DC

## 2017-06-07 NOTE — Progress Notes (Signed)
Subjective:    Patient ID: Angela Morgan, female    DOB: 08/13/1971, 46 y.o.   MRN: [id number] PCP: Wardell Honour, MD Chief Complaint  Patient presents with  . Dysuria    x4 days, per pt says urine has odor, and Saturday and Sunday pressure started and she felt couldn't empty her bladder with lower back pain.    HPI: 46 y/o F presents for possible UTI.  Has history of recurrent UTIs. 5 days ago started with urine odor. Waking to urinate in middle of night, which is abnormal for her. Low back pain and bladder pressure. Feels like she is urinating every 10 minutes. No pain with urination. Can't get get comfortable at night. Some chills 2 days ago. No blood in urine. 2-3 days ago had dark cloudy urine.  No fever. No flank pain.  Has tried Azo with some relief.   Patient Active Problem List   Diagnosis Date Noted  . Pilonidal cyst without abscess 03/28/2013  . Migraine headache 03/15/2013  . Premature menopause on HRT 01/09/2013  . 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 Depression with anxiety 08/04/2012  . Insomnia 08/04/2012   Past Medical History:  Diagnosis Date  . Allergic rhinitis, cause unspecified   . Allergy   . Anxiety   . Depression   . Depressive disorder, not elsewhere classified   . Fibromyalgia   . GERD (gastroesophageal reflux disease)    with esophagitis  . Insomnia, unspecified   . Irritable bowel syndrome   . Migraine, unspecified, without mention of intractable migraine without mention of status migrainosus   . Pilonidal cyst without abscess 03/28/2013  . Postablative ovarian failure   . Symptomatic states associated with artificial menopause   . Vestibular dysfunction   . Vestibular neuritis    Prior to Admission medications   Medication Sig Start Date End Date Taking? Authorizing Provider  estradiol (ESTRACE) 1 MG tablet TAKE 1 TABLET BY MOUTH EVERY DAY 0/48/88  Yes Copland, Alicia B, PA-C  ibuprofen (ADVIL,MOTRIN) 600 MG tablet Take 1 tablet (600 mg  total) by mouth every 6 (six) hours as needed. 09/08/14  Yes Junius Creamer, NP  loratadine (CLARITIN) 10 MG tablet Take 10 mg by mouth daily.   Yes [provider]  Multiple Vitamin (MULTIVITAMIN) tablet Take 1 tablet by mouth daily.   Yes [provider]  NON FORMULARY 1 suppository by Other route every other day. Estrogen/testosterone suppository/ pill every other day.   Yes [provider]  pantoprazole (PROTONIX) 40 MG tablet Take 1 tablet (40 mg total) by mouth daily. 03/08/17  Yes Wardell Honour, MD  Progesterone Micronized (PROGESTERONE PO) Take 75 mg by mouth daily.   Yes [provider]  ranitidine (ZANTAC) 150 MG tablet Take 1 tablet (150 mg total) by mouth 2 (two) times daily. 03/08/17  Yes Wardell Honour, MD  silver sulfADIAZINE (SILVADENE) 1 % cream Apply 1 application topically daily. 05/03/17  Yes Wardell Honour, MD  amoxicillin-clavulanate (AUGMENTIN) 875-125 MG tablet Take 1 tablet by mouth 2 (two) times daily. Patient not taking: Reported on 06/07/2017 05/03/17   Wardell Honour, MD  HYDROcodone-acetaminophen [facility]) 5-325 MG tablet Take 1 tablet by mouth every 6 (six) hours as needed. Patient not taking: Reported on 06/07/2017 05/03/17   Wardell Honour, MD  sulfamethoxazole-trimethoprim (BACTRIM DS,SEPTRA DS) 800-160 MG tablet Take 1 tablet by mouth 2 (two) times daily. 06/07/17   Harrison Mons, PA-C   Allergies  Allergen Reactions  .  Biaxin [Clarithromycin] Anaphylaxis    Review of Systems  Constitutional: Positive for chills and fatigue. Negative for fever.  HENT: Positive for congestion, postnasal drip and sinus pressure.   Eyes: Negative.   Respiratory: Negative for cough and shortness of breath.   Cardiovascular: Negative for chest pain and palpitations.  Gastrointestinal: Negative for abdominal pain, blood in stool, constipation, diarrhea, nausea and vomiting.  Endocrine: Positive for polyuria.  Genitourinary: Positive for dysuria, flank  pain and urgency.  Musculoskeletal: Positive for back pain.  Skin: Negative.   Allergic/Immunologic: Positive for environmental allergies.  Neurological: Negative.  Negative for dizziness, light-headedness and headaches.  Hematological: Negative.   Psychiatric/Behavioral: Positive for sleep disturbance.       Objective:   Physical Exam  Constitutional: She is oriented to person, place, and time. She appears well-developed. No distress.  BP 110/73 (BP Location: Right Arm, Patient Position: Sitting, Cuff Size: Normal)   Pulse 61   Temp 98.2 F (36.8 C) (Oral)   Resp 18   Ht 5' 4.5" (1.638 m)   Wt 147 lb (66.7 kg)   SpO2 97%   BMI 24.84 kg/m    HENT:  Head: Normocephalic and atraumatic.  Eyes: Conjunctivae and EOM are normal. Pupils are equal, round, and reactive to light.  Neck: Normal range of motion. Neck supple. No thyromegaly present.  Cardiovascular: Normal rate, regular rhythm, normal heart sounds and intact distal pulses.   Pulmonary/Chest: Effort normal and breath sounds normal. No respiratory distress.  Abdominal: Soft. Bowel sounds are normal. There is tenderness in the right lower quadrant, suprapubic area and left lower quadrant. There is CVA tenderness (worse on the left side).  Lymphadenopathy:    She has no cervical adenopathy.  Neurological: She is alert and oriented to person, place, and time.  Skin: She is not diaphoretic.   Results for orders placed or performed in visit on 06/07/17  POCT urinalysis dipstick  Result Value Ref Range   Color, UA yellow yellow   Clarity, UA clear clear   Glucose, UA negative negative mg/dL   Bilirubin, UA negative negative   Ketones, POC UA negative negative mg/dL   Spec Grav, UA 1.010 1.010 - 1.025   Blood, UA trace-intact (A) negative   pH, UA 7.0 5.0 - 8.0   Protein Ur, POC negative negative mg/dL   Urobilinogen, UA 0.2 0.2 or 1.0 E.U./dL   Nitrite, UA Negative Negative   Leukocytes, UA Negative Negative      Assessment & Plan:  1. Dysuria Patient has had increasing urinary frequency, and pressure in her bladder. Blood noted in urine dipstick. Will treat empirically pending urine microscopic and culture results.  - POCT urinalysis dipstick: positive for blood.  - Urine Microscopic - Urine Culture - sulfamethoxazole-trimethoprim (BACTRIM DS,SEPTRA DS) 800-160 MG tablet; Take 1 tablet by mouth 2 (two) times daily.  Dispense: 10 tablet; Refill: 0  Return if symptoms worsen or fail to improve.

## 2017-06-07 NOTE — Progress Notes (Signed)
Patient ID: Angela Morgan, female    DOB: Apr 20, 1971, 46 y.o.   MRN: [id number]  PCP: Wardell Honour, MD  Chief Complaint  Patient presents with  . Dysuria    x4 days, per pt says urine has odor, and Saturday and Sunday pressure started and she felt couldn't empty her bladder with lower back pain.    Subjective:   Presents for evaluation of 4 days of urinary odor, pressure, incomplete emptying.  Low back pain, L>R. 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 They have been lifting and carrying 20-lb fire extinguishers recently.  No loss of bowel or bladder control. No radicular pain. No paresthesias. No fever, chills, nausea, vomiting. No headache or dizziness.    Review of Systems As above.    Patient Active Problem List   Diagnosis Date Noted  . Pilonidal cyst without abscess 03/28/2013  . Migraine headache 03/15/2013  . Premature menopause on HRT 01/09/2013  . 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 Depression with anxiety 08/04/2012  . Insomnia 08/04/2012     Prior to Admission medications   Medication Sig Start Date End Date Taking? Authorizing Provider  estradiol (ESTRACE) 1 MG tablet TAKE 1 TABLET BY MOUTH EVERY DAY 01/01/50  Yes Copland, Alicia B, PA-C  ibuprofen (ADVIL,MOTRIN) 600 MG tablet Take 1 tablet (600 mg total) by mouth every 6 (six) hours as needed. 09/08/14  Yes Junius Creamer, NP  loratadine (CLARITIN) 10 MG tablet Take 10 mg by mouth daily.   Yes [provider]  Multiple Vitamin (MULTIVITAMIN) tablet Take 1 tablet by mouth daily.   Yes [provider]  NON FORMULARY 1 suppository by Other route every other day. Estrogen/testosterone suppository/ pill every other day.   Yes [provider]  pantoprazole (PROTONIX) 40 MG tablet Take 1 tablet (40 mg total) by mouth daily. 03/08/17  Yes Wardell Honour, MD  Progesterone Micronized  (PROGESTERONE PO) Take 75 mg by mouth daily.   Yes [provider]  ranitidine (ZANTAC) 150 MG tablet Take 1 tablet (150 mg total) by mouth 2 (two) times daily. 03/08/17  Yes Wardell Honour, MD  silver sulfADIAZINE (SILVADENE) 1 % cream Apply 1 application topically daily. 05/03/17  Yes Wardell Honour, MD  amoxicillin-clavulanate (AUGMENTIN) 875-125 MG tablet Take 1 tablet by mouth 2 (two) times daily. Patient not taking: Reported on 06/07/2017 05/03/17   Wardell Honour, MD  HYDROcodone-acetaminophen [facility]) 5-325 MG tablet Take 1 tablet by mouth every 6 (six) hours as needed. Patient not taking: Reported on 06/07/2017 05/03/17   Wardell Honour, MD     Allergies  Allergen Reactions  . Biaxin [Clarithromycin] Anaphylaxis       Objective:  Physical Exam  Constitutional: She is oriented to person, place, and time. She appears well-developed and well-nourished. She is active and cooperative. No distress.  BP 110/73 (BP Location: Right Arm, Patient Position: Sitting, Cuff Size: Normal)   Pulse 61   Temp 98.2 F (36.8 C) (Oral)   Resp 18   Ht 5' 4.5" (1.638 m)   Wt 147 lb (66.7 kg)   SpO2 97%   BMI 24.84 kg/m   HENT:  Head: Normocephalic and atraumatic.  Right Ear: Hearing normal.  Left Ear: Hearing normal.  Eyes: Conjunctivae are normal. No scleral icterus.  Neck: Normal range of motion. Neck supple. No thyromegaly present.  Cardiovascular: Normal rate, regular rhythm  and normal heart sounds.   Pulses:      Radial pulses are 2+ on the right side, and 2+ on the left side.  Pulmonary/Chest: Effort normal and breath sounds normal.  Abdominal: Soft. Normal appearance and bowel sounds are normal. There is no hepatosplenomegaly. There is tenderness in the suprapubic area. There is CVA tenderness (suspect this is actually musculoskeletal pain, not true CVA tenderness).  Musculoskeletal:       Cervical back: Normal.       Thoracic back: Normal.       Lumbar back: She exhibits  tenderness (L>R) and pain. She exhibits normal range of motion, no bony tenderness, no swelling, no edema, no deformity, no laceration, no spasm and normal pulse.  Lymphadenopathy:       Head (right side): No tonsillar, no preauricular, no posterior auricular and no occipital adenopathy present.       Head (left side): No tonsillar, no preauricular, no posterior auricular and no occipital adenopathy present.    She has no cervical adenopathy.       Right: No supraclavicular adenopathy present.       Left: No supraclavicular adenopathy present.  Neurological: She is alert and oriented to person, place, and time. No sensory deficit.  Skin: Skin is warm, dry and intact. No rash noted. No cyanosis or erythema. Nails show no clubbing.  Psychiatric: She has a normal mood and affect. Her speech is normal and behavior is normal.   Results for orders placed or performed in visit on 06/07/17  POCT urinalysis dipstick  Result Value Ref Range   Color, UA yellow yellow   Clarity, UA clear clear   Glucose, UA negative negative mg/dL   Bilirubin, UA negative negative   Ketones, POC UA negative negative mg/dL   Spec Grav, UA 1.010 1.010 - 1.025   Blood, UA trace-intact (A) negative   pH, UA 7.0 5.0 - 8.0   Protein Ur, POC negative negative mg/dL   Urobilinogen, UA 0.2 0.2 or 1.0 E.U./dL   Nitrite, UA Negative Negative   Leukocytes, UA Negative Negative      Assessment & Plan:   1. Dysuria Treat empirically for UTI while awaiting UCx results. Rest. Hydrate. Supportive care. - POCT urinalysis dipstick - Urine Microscopic - Urine Culture - sulfamethoxazole-trimethoprim (BACTRIM DS,SEPTRA DS) 800-160 MG tablet; Take 1 tablet by mouth 2 (two) times daily.  Dispense: 10 tablet; Refill: 0    Return if symptoms worsen or fail to improve.   Fara Chute, PA-C [facility]

## 2017-06-07 NOTE — Patient Instructions (Addendum)
Tell Angela Morgan that he needs to massage your back. Drink lots of water. Delegate household chores to Angela Morgan this week so that you can rest.    IF you received an x-ray today, you will receive an invoice from [facility]. Please contact [facility] at [phone] with questions or concerns regarding your invoice.   IF you received labwork today, you will receive an invoice from [place]. Please contact [facility] at [phone] with questions or concerns regarding your invoice.   Our billing staff will not be able to assist you with questions regarding bills from these companies.  You will be contacted with the lab results as soon as they are available. The fastest way to get your results is to activate your My Chart account. Instructions are located on the last page of this paperwork. If you have not heard from [place] regarding the results in 2 weeks, please contact this office.

## 2017-06-08 LAB — URINALYSIS, MICROSCOPIC ONLY: Casts: NONE SEEN /lpf

## 2017-06-08 LAB — URINE CULTURE

## 2017-08-08 ENCOUNTER — Encounter: Payer: Self-pay | Admitting: Physician Assistant

## 2017-08-13 ENCOUNTER — Ambulatory Visit: Payer: BC Managed Care – PPO | Admitting: Physician Assistant

## 2017-08-17 ENCOUNTER — Ambulatory Visit (INDEPENDENT_AMBULATORY_CARE_PROVIDER_SITE_OTHER): Payer: BC Managed Care – PPO | Admitting: Family Medicine

## 2017-08-17 ENCOUNTER — Encounter: Payer: Self-pay | Admitting: Family Medicine

## 2017-08-17 VITALS — BP 108/72 | HR 57 | Temp 98.0°F | Resp 16 | Ht 64.17 in | Wt 148.0 lb

## 2017-08-17 DIAGNOSIS — L659 Nonscarring hair loss, unspecified: Secondary | ICD-10-CM

## 2017-08-17 DIAGNOSIS — R5383 Other fatigue: Secondary | ICD-10-CM

## 2017-08-17 DIAGNOSIS — R350 Frequency of micturition: Secondary | ICD-10-CM | POA: Diagnosis not present

## 2017-08-17 DIAGNOSIS — D508 Other iron deficiency anemias: Secondary | ICD-10-CM

## 2017-08-17 DIAGNOSIS — K219 Gastro-esophageal reflux disease without esophagitis: Secondary | ICD-10-CM | POA: Diagnosis not present

## 2017-08-17 LAB — POCT URINALYSIS DIP (MANUAL ENTRY)
Bilirubin, UA: NEGATIVE
GLUCOSE UA: NEGATIVE mg/dL
Ketones, POC UA: NEGATIVE mg/dL
LEUKOCYTES UA: NEGATIVE
NITRITE UA: NEGATIVE
PH UA: 7.5 (ref 5.0–8.0)
Protein Ur, POC: NEGATIVE mg/dL
RBC UA: NEGATIVE
Spec Grav, UA: 1.01 (ref 1.010–1.025)
UROBILINOGEN UA: 0.2 U/dL

## 2017-08-17 NOTE — Patient Instructions (Signed)
     IF you received an x-ray today, you will receive an invoice from [facility]. Please contact [facility] at [phone] with questions or concerns regarding your invoice.   IF you received labwork today, you will receive an invoice from [facility]. Please contact [facility] at [phone] with questions or concerns regarding your invoice.   Our billing staff will not be able to assist you with questions regarding bills from these companies.  You will be contacted with the lab results as soon as they are available. The fastest way to get your results is to activate your My Chart account. Instructions are located on the last page of this paperwork. If you have not heard from us regarding the results in 2 weeks, please contact this office.     

## 2017-08-17 NOTE — Progress Notes (Signed)
Subjective:    Patient ID: Angela Morgan, female    DOB: [birth year], 46 y.o.   MRN: [id number]  08/17/2017  Urinary Frequency (x 2 weeks with pressure and odor) and Fatigue   HPI This 46 y.o. female presents for evaluation of urinary frequency and fatigue.  Going home in October 2018.  Started donating plasma again; had not donated since December 2017.  Went on Thursday; did a physical.  Ran H/H and protein.  Borderline.  Hct at 38.  Went in yesterday; Hct at 32.  Unable to donate.  A lot more fatigued lately.  Not irritable.  Increase in hair loss.  Suggested that iron is low.  Cannot stay awake at end of the day.  Donated two days last week.  Hct 38 and then 41 three days later.  Had taken an iron tablet.  Was so wiped out after donating.  Ate steak yesterday; had an iron tablet yesterday.  Able to work out on Monday night.    Pushing self to work out.  Plans to work out at [employer].   Protonix once daily for years.  IBS may be acting up.  Something is not right.  Cannot tolerate certain foods.  Colonoscopy in several years. Bowel movements have changed.  Oatmeal every day.  Rare bread.  Gets vegetables in.  Less frequent.   No sodas, tea, junk.   EGD two years ago.   Last urology visit in 2016; keeps going back to stent with hysterectomy.Surgery 18 years ago.     BP Readings from Last 3 Encounters:  08/17/17 108/72  06/07/17 110/73  05/03/17 119/76   Wt Readings from Last 3 Encounters:  08/17/17 148 lb (67.1 kg)  06/07/17 147 lb (66.7 kg)  05/03/17 146 lb (66.2 kg)   Immunization History  Administered Date(s) Administered  . Influenza Split 09/04/2012  . Influenza,inj,Quad PF,6+ Mos 12/26/2014, 10/20/2015  . Influenza-Unspecified 09/05/2013  . PPD Test 01/07/2014  . Tdap 01/06/2011, 05/03/2017    Review of Systems  Constitutional: Positive for fatigue. Negative for chills, diaphoresis and fever.  Eyes: Negative for visual disturbance.  Respiratory: Negative for cough  and shortness of breath.   Cardiovascular: Negative for chest pain, palpitations and leg swelling.  Gastrointestinal: Negative for abdominal pain, constipation, diarrhea, nausea and vomiting.  Endocrine: Negative for cold intolerance, heat intolerance, polydipsia, polyphagia and polyuria.  Genitourinary: Positive for frequency and urgency. Negative for decreased urine volume, difficulty urinating, dyspareunia, dysuria, flank pain, hematuria, pelvic pain, vaginal bleeding, vaginal discharge and vaginal pain.  Neurological: Negative for dizziness, tremors, seizures, syncope, facial asymmetry, speech difficulty, weakness, light-headedness, numbness and headaches.    Past Medical History:  Diagnosis Date  . Allergic rhinitis, cause unspecified   . Allergy   . Anxiety   . Depression   . Depressive disorder, not elsewhere classified   . Fibromyalgia   . GERD (gastroesophageal reflux disease)    with esophagitis  . Insomnia, unspecified   . Irritable bowel syndrome   . Migraine, unspecified, without mention of intractable migraine without mention of status migrainosus   . Pilonidal cyst without abscess 03/28/2013  . Postablative ovarian failure   . Symptomatic states associated with artificial menopause   . Vestibular dysfunction   . Vestibular neuritis    Past Surgical History:  Procedure Laterality Date  . APPENDECTOMY    . BREAST BIOPSY Left 2015 x2   NEG  . CESAREAN SECTION     x 2  . CHOLECYSTECTOMY    .  ESOPHAGOGASTRODUODENOSCOPY (EGD) WITH PROPOFOL N/A 01/12/2016   Procedure: ESOPHAGOGASTRODUODENOSCOPY (EGD) WITH PROPOFOL;  Surgeon: Hulen Luster, MD;  Location: [facility] ENDOSCOPY;  Service: Gastroenterology;  Laterality: N/A;  . KNEE SURGERY     right  . TEMPOROMANDIBULAR JOINT SURGERY    . TOTAL ABDOMINAL HYSTERECTOMY     ovaries removed   Allergies  Allergen Reactions  . Biaxin [Clarithromycin] Anaphylaxis    Social History   Social History  . Marital status: Married     Spouse name: Randall Hiss  . Number of children: 2  . Years of education: college   Occupational History  . 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 Smoking status: Never Smoker  . Smokeless tobacco: Never Used  . Alcohol use 2.4 oz/week    4 Glasses of wine per week  . Drug use: No  . Sexual activity: Yes    Birth control/ protection: Post-menopausal, Surgical   Other Topics Concern  . Not on file   Social History Narrative   Marital Status:  Remarried in 03/2015.  Second marriage.        Children:  2 children (23 [name], Villisca); no grandchildren.       Living:  living in apartment with husband in [place].  [name] will be going to [employer] in 2018-2019. Joint custody of Ian/stepson.    [name] is in [place].        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      Exercise: exercising sporadically in 2018; yoga and gym once per week.        Always uses seat belts / helmet.       Smoke alarm in the home.       No guns in the home.     Caffeine use: moderate      Education: College                              Family History  Problem Relation Age of Onset  . Diabetes Maternal Grandfather   . Hypertension Mother   . Stroke Mother   . COPD Mother   . Aneurysm Mother        brain  . Transient ischemic attack Mother        multiple  . Hyperlipidemia Mother   . Hypertension Father   . Heart disease Father   . Mental illness Father   . Depression Father   . Hyperlipidemia Father   . Hyperlipidemia Brother   . Hypertension Brother   . Hypertension Brother   . Asthma Son        Objective:    BP 108/72   Pulse (!) 57   Temp 98 F (36.7 C) (Oral)   Resp 16   Ht 5' 4.17" (1.63 m)   Wt 148 lb (67.1 kg)   SpO2 98%   BMI 25.27 kg/m  Physical Exam  Constitutional: She is oriented to person, place, and time. She appears well-developed and well-nourished. No distress.  HENT:    Head: Normocephalic and atraumatic.  Right Ear: External ear normal.  Left Ear: External ear normal.  Nose: Nose normal.  Mouth/Throat: Oropharynx is clear and moist.  Eyes: Pupils are equal, round, and reactive to light. Conjunctivae and EOM are normal.  Neck: Normal range of motion. Neck supple. Carotid bruit is not present. No thyromegaly  present.  Cardiovascular: Normal rate, regular rhythm, normal heart sounds and intact distal pulses.  Exam reveals no gallop and no friction rub.   No murmur heard. Pulmonary/Chest: Effort normal and breath sounds normal. She has no wheezes. She has no rales.  Abdominal: Soft. Bowel sounds are normal. She exhibits no distension and no mass. There is no tenderness. There is no rebound and no guarding.  Lymphadenopathy:    She has no cervical adenopathy.  Neurological: She is alert and oriented to person, place, and time. No cranial nerve deficit.  Skin: Skin is warm and dry. No rash noted. She is not diaphoretic. No erythema. No pallor.  Psychiatric: She has a normal mood and affect. Her behavior is normal.   Results for orders placed or performed in visit on 08/17/17  Urine Culture  Result Value Ref Range   Urine Culture, Routine Final report    Organism ID, Bacteria No growth   CBC with Differential/Platelet  Result Value Ref Range   WBC 5.9 3.4 - 10.8 x10E3/uL   RBC 4.35 3.77 - 5.28 x10E6/uL   Hemoglobin 13.4 11.1 - 15.9 g/dL   Hematocrit 38.6 34.0 - 46.6 %   MCV 89 79 - 97 fL   MCH 30.8 26.6 - 33.0 pg   MCHC 34.7 31.5 - 35.7 g/dL   RDW 12.7 12.3 - 15.4 %   Platelets 253 150 - 379 x10E3/uL   Neutrophils 50 Not Estab. %   Lymphs 40 Not Estab. %   Monocytes 7 Not Estab. %   Eos 2 Not Estab. %   Basos 1 Not Estab. %   Neutrophils Absolute 3.0 1.4 - 7.0 x10E3/uL   Lymphocytes Absolute 2.4 0.7 - 3.1 x10E3/uL   Monocytes Absolute 0.4 0.1 - 0.9 x10E3/uL   EOS (ABSOLUTE) 0.1 0.0 - 0.4 x10E3/uL   Basophils Absolute 0.0 0.0 - 0.2 x10E3/uL    Immature Granulocytes 0 Not Estab. %   Immature Grans (Abs) 0.0 0.0 - 0.1 x10E3/uL  Comprehensive metabolic panel  Result Value Ref Range   Glucose 79 65 - 99 mg/dL   BUN 13 6 - 24 mg/dL   Creatinine, Ser 0.72 0.57 - 1.00 mg/dL   GFR calc non Af Amer 101 >59 mL/min/1.73   GFR calc Af Amer 116 >59 mL/min/1.73   BUN/Creatinine Ratio 18 9 - 23   Sodium 143 134 - 144 mmol/L   Potassium 4.1 3.5 - 5.2 mmol/L   Chloride 104 96 - 106 mmol/L   CO2 23 20 - 29 mmol/L   Calcium 9.6 8.7 - 10.2 mg/dL   Total Protein 6.5 6.0 - 8.5 g/dL   Albumin 4.3 3.5 - 5.5 g/dL   Globulin, Total 2.2 1.5 - 4.5 g/dL   Albumin/Globulin Ratio 2.0 1.2 - 2.2   Bilirubin Total 0.3 0.0 - 1.2 mg/dL   Alkaline Phosphatase 58 39 - 117 IU/L   AST 19 0 - 40 IU/L   ALT 15 0 - 32 IU/L  Ferritin  Result Value Ref Range   Ferritin 133 15 - 150 ng/mL  Iron  Result Value Ref Range   Iron 66 27 - 159 ug/dL  VITAMIN D 25 Hydroxy (Vit-D Deficiency, Fractures)  Result Value Ref Range   Vit D, 25-Hydroxy 56.8 30.0 - 100.0 ng/mL  Vitamin B12  Result Value Ref Range   Vitamin B-12 745 232 - 1,245 pg/mL  TSH  Result Value Ref Range   TSH 1.360 0.450 - 4.500 uIU/mL  POCT urinalysis dipstick  Result Value Ref Range   Color, UA yellow yellow   Clarity, UA clear clear   Glucose, UA negative negative mg/dL   Bilirubin, UA negative negative   Ketones, POC UA negative negative mg/dL   Spec Grav, UA 1.010 1.010 - 1.025   Blood, UA negative negative   pH, UA 7.5 5.0 - 8.0   Protein Ur, POC negative negative mg/dL   Urobilinogen, UA 0.2 0.2 or 1.0 E.U./dL   Nitrite, UA Negative Negative   Leukocytes, UA Negative Negative   No results found. Depression screen [facility] 2/9 08/17/2017 06/07/2017 05/03/2017 04/14/2017 03/08/2017  Decreased Interest 0 0 0 0 0  Down, Depressed, Hopeless 0 0 0 0 0  PHQ - 2 Score 0 0 0 0 0   Fall Risk  08/17/2017 06/07/2017 05/03/2017 04/14/2017 03/08/2017  Falls in the past year? No No No No No  Number falls in  past yr: - - - - -  Injury with Fall? - - - - -        Assessment & Plan:   1. Urinary frequency   2. Other iron deficiency anemia   3. Other fatigue   4. Hair loss   5. Gastroesophageal reflux disease without esophagitis    -recurrent urinary frequency; send urine cx; last urine cx negative with similar symptoms; if urine cx negative, recommend urology consultation.  -new onset anemia associated with hair loss and fatigue; obtain labs.  Hemoccult cards also provided to rule out GI blood loss.  S/p hysterectomy.  Has also been maintained on PPI for years;thus, iron deficiency may be source of anemia.   -pt desires to decrease PPI to qod. -with hair loss, will also obtain vitamin D level and TSH.   Orders Placed This Encounter  Procedures  . Urine Culture    Order Specific Question:   Source    Answer:   clean catch  . CBC with Differential/Platelet  . Comprehensive metabolic panel  . Ferritin  . Iron  . VITAMIN D 25 Hydroxy (Vit-D Deficiency, Fractures)  . Vitamin B12  . TSH  . POCT urinalysis dipstick  . POC Hemoccult Bld/Stl (3-Cd Home Screen)    Standing Status:   Future    Standing Expiration Date:   08/17/2018   No orders of the defined types were placed in this encounter.   No Follow-up on file.   Ariann Khaimov Elayne Guerin, M.D. Primary Care at [facility] previously [facility] [place] [address] [place], [place]  [address] [phone] phone [phone] fax

## 2017-08-18 LAB — CBC WITH DIFFERENTIAL/PLATELET
Basophils Absolute: 0 10*3/uL (ref 0.0–0.2)
Basos: 1 %
EOS (ABSOLUTE): 0.1 10*3/uL (ref 0.0–0.4)
Eos: 2 %
Hematocrit: 38.6 % (ref 34.0–46.6)
Hemoglobin: 13.4 g/dL (ref 11.1–15.9)
IMMATURE GRANS (ABS): 0 10*3/uL (ref 0.0–0.1)
IMMATURE GRANULOCYTES: 0 %
LYMPHS: 40 %
Lymphocytes Absolute: 2.4 10*3/uL (ref 0.7–3.1)
MCH: 30.8 pg (ref 26.6–33.0)
MCHC: 34.7 g/dL (ref 31.5–35.7)
MCV: 89 fL (ref 79–97)
MONOS ABS: 0.4 10*3/uL (ref 0.1–0.9)
Monocytes: 7 %
NEUTROS PCT: 50 %
Neutrophils Absolute: 3 10*3/uL (ref 1.4–7.0)
PLATELETS: 253 10*3/uL (ref 150–379)
RBC: 4.35 x10E6/uL (ref 3.77–5.28)
RDW: 12.7 % (ref 12.3–15.4)
WBC: 5.9 10*3/uL (ref 3.4–10.8)

## 2017-08-18 LAB — VITAMIN D 25 HYDROXY (VIT D DEFICIENCY, FRACTURES): VIT D 25 HYDROXY: 56.8 ng/mL (ref 30.0–100.0)

## 2017-08-18 LAB — IRON: IRON: 66 ug/dL (ref 27–159)

## 2017-08-18 LAB — COMPREHENSIVE METABOLIC PANEL
ALT: 15 IU/L (ref 0–32)
AST: 19 IU/L (ref 0–40)
Albumin/Globulin Ratio: 2 (ref 1.2–2.2)
Albumin: 4.3 g/dL (ref 3.5–5.5)
Alkaline Phosphatase: 58 IU/L (ref 39–117)
BUN/Creatinine Ratio: 18 (ref 9–23)
BUN: 13 mg/dL (ref 6–24)
Bilirubin Total: 0.3 mg/dL (ref 0.0–1.2)
CO2: 23 mmol/L (ref 20–29)
CREATININE: 0.72 mg/dL (ref 0.57–1.00)
Calcium: 9.6 mg/dL (ref 8.7–10.2)
Chloride: 104 mmol/L (ref 96–106)
GFR, EST AFRICAN AMERICAN: 116 mL/min/{1.73_m2} (ref >59)
GFR, EST NON AFRICAN AMERICAN: 101 mL/min/{1.73_m2} (ref >59)
GLUCOSE: 79 mg/dL (ref 65–99)
Globulin, Total: 2.2 g/dL (ref 1.5–4.5)
POTASSIUM: 4.1 mmol/L (ref 3.5–5.2)
Sodium: 143 mmol/L (ref 134–144)
TOTAL PROTEIN: 6.5 g/dL (ref 6.0–8.5)

## 2017-08-18 LAB — URINE CULTURE: Organism ID, Bacteria: NO GROWTH

## 2017-08-18 LAB — TSH: TSH: 1.36 u[IU]/mL (ref 0.450–4.500)

## 2017-08-18 LAB — FERRITIN: Ferritin: 133 ng/mL (ref 15–150)

## 2017-08-18 LAB — VITAMIN B12: VITAMIN B 12: 745 pg/mL (ref 232–1245)

## 2017-08-22 ENCOUNTER — Encounter: Payer: Self-pay | Admitting: Family Medicine

## 2017-11-04 ENCOUNTER — Other Ambulatory Visit: Payer: Self-pay

## 2017-11-04 ENCOUNTER — Ambulatory Visit: Payer: BC Managed Care – PPO | Admitting: Family Medicine

## 2017-11-04 ENCOUNTER — Encounter: Payer: Self-pay | Admitting: Family Medicine

## 2017-11-04 VITALS — BP 108/70 | HR 85 | Temp 98.4°F | Resp 16 | Ht 64.96 in | Wt 149.2 lb

## 2017-11-04 DIAGNOSIS — F418 Other specified anxiety disorders: Secondary | ICD-10-CM

## 2017-11-04 MED ORDER — CLONAZEPAM 0.5 MG PO TABS: 0.5000 mg | ORAL_TABLET | Freq: Two times a day (BID) | ORAL | 1 refills | Status: DC | PRN

## 2017-11-04 MED ORDER — ESCITALOPRAM OXALATE 5 MG PO TABS: 5.0000 mg | ORAL_TABLET | Freq: Every day | ORAL | 5 refills | Status: DC
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
## 2017-11-04 NOTE — Patient Instructions (Addendum)
  Call [facility] for counseling/therapy.     IF you received an x-ray today, you will receive an invoice from [facility]. Please contact [facility] at [phone] with questions or concerns regarding your invoice.   IF you received labwork today, you will receive an invoice from [facility]. Please contact [facility] at [phone] with questions or concerns regarding your invoice.   Our billing staff will not be able to assist you with questions regarding bills from these companies.  You will be contacted with the lab results as soon as they are available. The fastest way to get your results is to activate your My Chart account. Instructions are located on the last page of this paperwork. If you have not heard from [place] regarding the results in 2 weeks, please contact this office.

## 2017-11-08 ENCOUNTER — Encounter: Payer: Self-pay | Admitting: Family Medicine

## 2017-12-05 ENCOUNTER — Ambulatory Visit: Payer: BC Managed Care – PPO | Admitting: Family Medicine

## 2017-12-12 ENCOUNTER — Ambulatory Visit: Payer: BC Managed Care – PPO | Admitting: Family Medicine

## 2017-12-12 ENCOUNTER — Encounter: Payer: Self-pay | Admitting: Family Medicine

## 2017-12-12 VITALS — BP 116/82 | HR 70 | Temp 98.0°F | Resp 16 | Ht 64.17 in | Wt 149.0 lb

## 2017-12-12 DIAGNOSIS — F418 Other specified anxiety disorders: Secondary | ICD-10-CM | POA: Diagnosis not present

## 2017-12-12 MED ORDER — BUPROPION HCL 75 MG PO TABS: 75.0000 mg | ORAL_TABLET | Freq: Two times a day (BID) | ORAL | 3 refills | Status: DC
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
## 2017-12-12 NOTE — Patient Instructions (Addendum)
  Stop Lexapro. Start Wellbutrin 75mg  1/2 every morning for one week and then increase to twice daily. You can still take Klonopin as needed.     IF you received an x-ray today, you will receive an invoice from [facility] Radiology. Please contact [facility] at [phone] with questions or concerns regarding your invoice.   IF you received labwork today, you will receive an invoice from [facility]. Please contact [facility] at [phone] with questions or concerns regarding your invoice.   Our billing staff will not be able to assist you with questions regarding bills from these companies.  You will be contacted with the lab results as soon as they are available. The fastest way to get your results is to activate your My Chart account. Instructions are located on the last page of this paperwork. If you have not heard from [place] regarding the results in 2 weeks, please contact this office.

## 2018-01-03 ENCOUNTER — Ambulatory Visit: Payer: BC Managed Care – PPO | Admitting: Obstetrics and Gynecology

## 2018-01-13 DIAGNOSIS — M2241 Chondromalacia patellae, right knee: Secondary | ICD-10-CM | POA: Insufficient documentation

## 2018-01-16 ENCOUNTER — Ambulatory Visit (INDEPENDENT_AMBULATORY_CARE_PROVIDER_SITE_OTHER): Payer: BC Managed Care – PPO | Admitting: Obstetrics and Gynecology

## 2018-01-16 ENCOUNTER — Encounter: Payer: Self-pay | Admitting: Obstetrics and Gynecology

## 2018-01-16 VITALS — BP 120/80 | HR 62 | Ht 64.0 in | Wt 151.0 lb

## 2018-01-16 DIAGNOSIS — Z79890 Hormone replacement therapy (postmenopausal): Secondary | ICD-10-CM

## 2018-01-16 DIAGNOSIS — N951 Menopausal and female climacteric states: Secondary | ICD-10-CM | POA: Diagnosis not present

## 2018-01-16 DIAGNOSIS — N952 Postmenopausal atrophic vaginitis: Secondary | ICD-10-CM

## 2018-01-16 DIAGNOSIS — Z1239 Encounter for other screening for malignant neoplasm of breast: Secondary | ICD-10-CM

## 2018-01-16 DIAGNOSIS — Z01419 Encounter for gynecological examination (general) (routine) without abnormal findings: Secondary | ICD-10-CM

## 2018-01-16 DIAGNOSIS — Z1231 Encounter for screening mammogram for malignant neoplasm of breast: Secondary | ICD-10-CM

## 2018-01-16 MED ORDER — AMBULATORY NON FORMULARY MEDICATION
3 refills | Status: DC
Start: 2018-01-16 — End: 2019-03-22

## 2018-01-16 MED ORDER — ESTRADIOL 1 MG PO TABS: 1.0000 mg | ORAL_TABLET | Freq: Every day | ORAL | 3 refills | Status: DC

## 2018-01-16 MED ORDER — AMBULATORY NON FORMULARY MEDICATION: 11 refills | Status: DC

## 2018-01-16 NOTE — Progress Notes (Signed)
PCP:  Wardell Honour, MD   Chief Complaint  Patient presents with  . Gynecologic Exam     HPI:      [name] is a 47 y.o. [phone] who LMP was No LMP recorded. Patient has had a hysterectomy., presents today for her annual examination.  Her menses are absent due to hyst. She does not have intermenstrual bleeding. She has had increased vasomotor sx the past 2 months. Prior to this, her sx were controlled with estradiol 1 mg daily and prog 150 mg SR caps, 6 nights on, 1 night off. Pt has been under increased stress since 11/18 and has also noted wt gain, hair loss, and decreased libido with vasomotor sx. She was started on lexapro with PCP, then changed to wellbutrin a month ago. Pt not adjusting well to "empty nest". Seeing therapist, too.  Sex activity: single partner, contraception - post menopausal status. She uses estradiol/testosterone vag supp 2-3 times weekly for vag dryness sx. She is also using lubricants with sx relief.  Last Pap: December 20, 2016  Results were: no abnormalities /neg HPV DNA  Hx of STDs: none  Last mammogram: January 28, 2017  Results were: normal--routine follow-up in 12 months There is no FH of breast cancer. There is a FH of ovarian cancer in her mat grt aunt, genetic testing not indicated. The patient does do self-breast exams.  Tobacco use: The patient denies current or previous tobacco use. Alcohol use: none No drug use.  Exercise: moderately active  She does get adequate calcium and Vitamin D in her diet.  Labs with PCP.   Past Medical History:  Diagnosis Date  . Allergic rhinitis, cause unspecified   . Allergy   . Anxiety   . Depression   . Depressive disorder, not elsewhere classified   . Fibromyalgia   . GERD (gastroesophageal reflux disease)    with esophagitis  . Insomnia, unspecified   . Irritable bowel syndrome   . Migraine, unspecified, without mention of intractable migraine without mention of status migrainosus   .  Pilonidal cyst without abscess 03/28/2013  . Postablative ovarian failure   . Symptomatic states associated with artificial menopause   . Vestibular dysfunction   . Vestibular neuritis     Past Surgical History:  Procedure Laterality Date  . APPENDECTOMY    . BREAST BIOPSY Left 2015 x2   NEG  . CESAREAN SECTION     x 2  . CHOLECYSTECTOMY    . ESOPHAGOGASTRODUODENOSCOPY (EGD) WITH PROPOFOL N/A 01/12/2016   Procedure: ESOPHAGOGASTRODUODENOSCOPY (EGD) WITH PROPOFOL;  Surgeon: Hulen Luster, MD;  Location: [facility] ENDOSCOPY;  Service: Gastroenterology;  Laterality: N/A;  . KNEE SURGERY     right  . Laparoscopy  1992/1993, 1996/1997   Endometriosis  . TEMPOROMANDIBULAR JOINT SURGERY    . TOTAL ABDOMINAL HYSTERECTOMY     ovaries removed    Family History  Problem Relation Age of Onset  . Diabetes Maternal Grandfather   . Hypertension Mother   . Stroke Mother   . COPD Mother   . Aneurysm Mother        brain  . Transient ischemic attack Mother        multiple  . Hyperlipidemia Mother   . Hypertension Father   . Heart disease Father   . Mental illness Father   . Depression Father   . Hyperlipidemia Father   . Hyperlipidemia Brother   . Hypertension Brother   . Hypertension Brother   .  Asthma Son     Social History   Socioeconomic History  . Marital status: Married    Spouse name: Randall Hiss  . Number of children: 2  . Years of education: college  . Highest education level: Not on file  Social Needs  . Financial resource strain: Not on file  . Food insecurity - worry: Not on file  . Food insecurity - inability: Not on file  . Transportation needs - medical: Not on file  . Transportation needs - non-medical: Not on file  Occupational History  . Occupation: Physicist, medical    Comment: Acupuncturist  Tobacco Use  . Smoking status: Never Smoker  . Smokeless tobacco: Never Used  Substance and Sexual Activity  . Alcohol use: Yes    Alcohol/week: 2.4 oz     Types: 4 Glasses of wine per week  . Drug use: No  . Sexual activity: Yes    Birth control/protection: Post-menopausal, Surgical  Other Topics Concern  . Not on file  Social History Narrative   Marital Status:  Remarried in 03/2015.  Second marriage.        Children:  2 children (23 [name], [place]); no grandchildren.       Living:  living in apartment with husband in [place].  [name] will be going to [employer] in 2018-2019. Joint custody of Ian/stepson.    [name] is in [place].        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      Exercise: exercising sporadically in 2018; yoga and gym once per week.        Always uses seat belts / helmet.       Smoke alarm in the home.       No guns in the home.     Caffeine use: moderate      Education: College                            No outpatient medications have been marked as taking for the 01/16/18 encounter (Office Visit) with Copland, Alicia B, PA-C.     ROS:  Review of Systems  Constitutional: Negative for fatigue, fever and unexpected weight change.  Respiratory: Negative for cough, shortness of breath and wheezing.   Cardiovascular: Negative for chest pain, palpitations and leg swelling.  Gastrointestinal: Negative for blood in stool, constipation, diarrhea, nausea and vomiting.  Endocrine: Negative for cold intolerance, heat intolerance and polyuria.  Genitourinary: Negative for dyspareunia, dysuria, flank pain, frequency, genital sores, hematuria, menstrual problem, pelvic pain, urgency, vaginal bleeding, vaginal discharge and vaginal pain.  Musculoskeletal: Positive for arthralgias and joint swelling. Negative for back pain and myalgias.  Skin: Negative for rash.  Neurological: Negative for dizziness, syncope, light-headedness, numbness and headaches.  Hematological: Negative for adenopathy.  Psychiatric/Behavioral: Negative for agitation, confusion, sleep disturbance and suicidal  ideas. The patient is not nervous/anxious.      Objective: BP 120/80   Pulse 62   Ht 5\' 4"  (1.626 m)   Wt 151 lb (68.5 kg)   BMI 25.92 kg/m    Physical Exam  Constitutional: She is oriented to person, place, and time. She appears well-developed and well-nourished.  Genitourinary: Vagina normal. There is no rash or tenderness on the right labia. There is no rash or tenderness on the left labia. No erythema or tenderness in the vagina. No vaginal discharge found. Right adnexum does not display  mass and does not display tenderness. Left adnexum does not display mass and does not display tenderness.  Genitourinary Comments: UTERUS/CX SURG REM  Neck: Normal range of motion. No thyromegaly present.  Cardiovascular: Normal rate, regular rhythm and normal heart sounds.  No murmur heard. Pulmonary/Chest: Effort normal and breath sounds normal. Right breast exhibits no mass, no nipple discharge, no skin change and no tenderness. Left breast exhibits no mass, no nipple discharge, no skin change and no tenderness.  Abdominal: Soft. There is no tenderness. There is no guarding.  Musculoskeletal: Normal range of motion.  Neurological: She is alert and oriented to person, place, and time. No cranial nerve deficit.  Psychiatric: She has a normal mood and affect. Her behavior is normal.  Vitals reviewed.   Assessment/Plan: Encounter for annual routine gynecological examination  Screening for breast cancer - Pt to sched mammo. - Plan: MM DIGITAL SCREENING BILATERAL  Hormone replacement therapy (HRT) - Rx RF. Estradiol to [facility]. Vag supp and prog sent to [facility] for compouding. - Plan: estradiol (ESTRACE) 1 MG tablet, AMBULATORY NON FORMULARY MEDICATION, AMBULATORY NON FORMULARY MEDICATION  Vasomotor symptoms due to menopause - Sx increase most likely due to increased stress. Stress mgmt techniques discussed. Sx controlled with current dose before stress. F/u prn.  - Plan: estradiol  (ESTRACE) 1 MG tablet, AMBULATORY NON FORMULARY MEDICATION  Postmenopausal atrophic vaginitis - Vag tissue well-estrogenated. Cont HRT vag supp, 1-2 times weekly. Rx sent to [facility]. - Plan: AMBULATORY NON FORMULARY MEDICATION  Meds ordered this encounter  Medications  . estradiol (ESTRACE) 1 MG tablet    Sig: Take 1 tablet (1 mg total) by mouth daily.    Dispense:  90 tablet    Refill:  3    Order Specific Question:   Supervising Provider    Answer:   Gae Dry [id number]  . AMBULATORY NON FORMULARY MEDICATION    Sig: Progesterone 150 mg SR caps, Take 1 capsule nightly, 6 nights on, 1 night off    Dispense:  30 capsule    Refill:  11    Order Specific Question:   Supervising Provider    Answer:   Gae Dry [id number]  . AMBULATORY NON FORMULARY MEDICATION    Sig: Estriol-testosterone ovules 1mg /1mg  Insert 1 ovule vaginally 1-2 times weekly prn sx    Dispense:  30 Units    Refill:  3    Order Specific Question:   Supervising Provider    Answer:   Gae Dry [[id number]]             GYN counsel breast self exam, mammography screening, menopause, adequate intake of calcium and vitamin D, diet and exercise     F/U  Return in about 1 year (around 01/16/2019).  Alicia B. Copland, PA-C 01/16/2018 4:37 PM

## 2018-01-16 NOTE — Patient Instructions (Signed)
I value your feedback and entrusting us with your care. If you get a [facility] patient survey, I would appreciate you taking the time to let us know about your experience today. Thank you! 

## 2018-01-30 ENCOUNTER — Ambulatory Visit (INDEPENDENT_AMBULATORY_CARE_PROVIDER_SITE_OTHER): Payer: BC Managed Care – PPO | Admitting: Family Medicine

## 2018-01-30 ENCOUNTER — Encounter: Payer: Self-pay | Admitting: Family Medicine

## 2018-01-30 ENCOUNTER — Other Ambulatory Visit: Payer: Self-pay

## 2018-01-30 VITALS — BP 102/70 | HR 73 | Temp 98.0°F | Resp 16 | Ht 64.96 in | Wt 150.0 lb

## 2018-01-30 DIAGNOSIS — M2241 Chondromalacia patellae, right knee: Secondary | ICD-10-CM

## 2018-01-30 DIAGNOSIS — F418 Other specified anxiety disorders: Secondary | ICD-10-CM

## 2018-01-30 MED ORDER — BUPROPION HCL ER (XL) 150 MG PO TB24: 150.0000 mg | ORAL_TABLET | Freq: Every day | ORAL | 5 refills | Status: DC

## 2018-01-30 NOTE — Progress Notes (Signed)
Subjective:    Patient ID: Angela Morgan, female    DOB: February 04, 1971, 47 y.o.   MRN: [id number]  01/30/2018  Depression (follow-up ) and Anxiety    HPI This 47 y.o. female presents for evaluation of anxiety and depression; management changes made at last visit include the following:   Persistent depression with anxiety despite Lexapro therapy.  Recommend weaning Lexapro therapy due to intolerance.  Start Wellbutrin 75 mg one half every morning for 1 week and then increase to twice daily.  Continue Klonopin as needed.  Continue ongoing psychotherapy.  Continue daily exercise for stress and anxiety and depression management.   R knee pain: s/p MRI; s/p steroid injection that did not improve. Old volleyball injury; three surgeries on knee RIGHT.  Last surgery 1999.  Started going to the gym two months ago.  Slow and steady.  Testing at work has been a nightmare.Got on the stairmaster.  Got up to jump on trampoline.  Felt a pop.  Could not do box jump. Then started popping like a rubber band.  Saw Dr. Len Childs.  Suggested medicine, cortizone, surgery.  Recommended TKR by age 47.  Really did not want cortizone.  Scar tissue.  Knee brace is helpful; catching and unable to pop back out with knee brace.  Still going to the gym.  Working with Clinical research associate.  Book tickets for July to return to [place] in July.  Wants to get back to 135 pounds.  Sits at home resting on weekends.  Eating.  Plans to talk with Eddie/trainer to modify workout.  Time with son.  Waiting on call about MRI results.  Has a feeling that all structural.  If strengthens R quadricep, can delay TKR.   No stairs.    Horrible empty nest right now.  Has not seen [name] in three weeks.  Was going Wednesday and Thursday.  Husband starts with [employer] in April 2019.  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  Plans to have heart to heart at end of March.  Randall Hiss  will have training for sixteen weeks.  Refuses to have surgery now; wants to have surgery in March.  Confident will need surgery.  Cannot remember Wellbutrin.  Better.  Unable to see Sheryl due to illness/kidney stone.  Fluctuates.  Still a roller coaster.  Hearing the birds is helpful. Then will dip.  Less severe. Did get to spend the night with [name].  Struggling with Wilfred Lacy being gone.  When [place] changes, most depressed.  Eric changes; pt changes.  Spring will really help patient.  Teacher bullying patient, calling racist, harrassment.  Hostile work environment.  Sleeping well.  Crashes at 11:00pm; wakes up 6:00am. Wellbutrin is nice; no groggy.  Handling things better.  Easily tearful.   BP Readings from Last 3 Encounters:  01/30/18 102/70  01/16/18 120/80  12/12/17 116/82   Wt Readings from Last 3 Encounters:  01/30/18 150 lb (68 kg)  01/16/18 151 lb (68.5 kg)  12/12/17 149 lb (67.6 kg)   Immunization History  Administered Date(s) Administered  . Influenza Split 09/04/2012  . Influenza,inj,Quad PF,6+ Mos 12/26/2014, 10/20/2015  . Influenza-Unspecified 09/05/2013, 08/12/2017  . PPD Test 01/07/2014  . Tdap 01/06/2011, 05/03/2017    Review of Systems  Constitutional: Negative for chills, diaphoresis, fatigue and fever.  HENT: Negative for ear pain, postnasal drip, rhinorrhea, sinus pressure, sore throat and trouble swallowing.   Respiratory: Negative  for cough and shortness of breath.   Cardiovascular: Negative for chest pain, palpitations and leg swelling.  Gastrointestinal: Negative for abdominal pain, constipation, diarrhea, nausea and vomiting.  Musculoskeletal: Positive for arthralgias, gait problem and joint swelling.  Psychiatric/Behavioral: Positive for dysphoric mood. Negative for self-injury, sleep disturbance and suicidal ideas. The patient is nervous/anxious. The patient is not hyperactive.     Past Medical History:  Diagnosis Date  . Allergic rhinitis, cause  unspecified   . Allergy   . Anxiety   . Depression   . Depressive disorder, not elsewhere classified   . Fibromyalgia   . GERD (gastroesophageal reflux disease)    with esophagitis  . Insomnia, unspecified   . Irritable bowel syndrome   . Migraine, unspecified, without mention of intractable migraine without mention of status migrainosus   . Pilonidal cyst without abscess 03/28/2013  . Postablative ovarian failure   . Symptomatic states associated with artificial menopause   . Vestibular dysfunction   . Vestibular neuritis    Past Surgical History:  Procedure Laterality Date  . APPENDECTOMY    . BREAST BIOPSY Left 2015 x2   NEG  . CESAREAN SECTION     x 2  . CHOLECYSTECTOMY    . ESOPHAGOGASTRODUODENOSCOPY (EGD) WITH PROPOFOL N/A 01/12/2016   Procedure: ESOPHAGOGASTRODUODENOSCOPY (EGD) WITH PROPOFOL;  Surgeon: Hulen Luster, MD;  Location: [facility] ENDOSCOPY;  Service: Gastroenterology;  Laterality: N/A;  . KNEE SURGERY     right  . Laparoscopy  1992/1993, 1996/1997   Endometriosis  . TEMPOROMANDIBULAR JOINT SURGERY    . TOTAL ABDOMINAL HYSTERECTOMY     ovaries removed   Allergies  Allergen Reactions  . Biaxin [Clarithromycin] Anaphylaxis   Current Outpatient Medications on File Prior to Visit  Medication Sig Dispense Refill  . AMBULATORY NON FORMULARY MEDICATION Progesterone 150 mg SR caps, Take 1 capsule nightly, 6 nights on, 1 night off 30 capsule 11  . AMBULATORY NON FORMULARY MEDICATION Estriol-testosterone ovules 1mg /1mg  Insert 1 ovule vaginally 1-2 times weekly prn sx 30 Units 3  . clonazePAM (KLONOPIN) 0.5 MG tablet Take 1 tablet (0.5 mg total) by mouth 2 (two) times daily as needed for anxiety. 30 tablet 1  . diclofenac (VOLTAREN) 75 MG EC tablet Take by mouth.    . estradiol (ESTRACE) 1 MG tablet Take 1 tablet (1 mg total) by mouth daily. 90 tablet 3  . ibuprofen (ADVIL,MOTRIN) 600 MG tablet Take 1 tablet (600 mg total) by mouth every 6 (six) hours as needed. 30 tablet 0    . loratadine (CLARITIN) 10 MG tablet Take 10 mg by mouth daily.    . Multiple Vitamin (MULTIVITAMIN) tablet Take 1 tablet by mouth daily.    . pantoprazole (PROTONIX) 40 MG tablet Take 1 tablet (40 mg total) by mouth daily. 90 tablet 3  . ranitidine (ZANTAC) 150 MG tablet Take 1 tablet (150 mg total) by mouth 2 (two) times daily. 180 tablet 3   No current facility-administered medications on file prior to visit.    Social History   Socioeconomic History  . Marital status: Married    Spouse name: Randall Hiss  . Number of children: 2  . Years of education: college  . Highest education level: Not on file  Social Needs  . Financial resource strain: Not on file  . Food insecurity - worry: Not on file  . Food insecurity - inability: Not on file  . Transportation needs - medical: Not on file  . Transportation needs - non-medical: Not on  file  Occupational History  . Occupation: Physicist, medical    Comment: Acupuncturist  Tobacco Use  . Smoking status: Never Smoker  . Smokeless tobacco: Never Used  Substance and Sexual Activity  . Alcohol use: Yes    Alcohol/week: 2.4 oz    Types: 4 Glasses of wine per week  . Drug use: No  . Sexual activity: Yes    Birth control/protection: Post-menopausal, Surgical  Other Topics Concern  . Not on file  Social History Narrative   Marital Status:  Remarried in 03/2015.  Second marriage.        Children:  2 children (23 [name], [place]); no grandchildren.       Living:  living in apartment with husband in [place].  [name] will be going to [employer] in 2018-2019. Joint custody of Ian/stepson.    [name] is in [place].        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      Exercise: exercising sporadically in 2018; yoga and gym once per week.        Always uses seat belts / helmet.       Smoke alarm in the home.       No guns in the home.     Caffeine use: moderate      Education: College                            Family History  Problem Relation Age of Onset  . Diabetes Maternal Grandfather   . Hypertension Mother   . Stroke Mother   . COPD Mother   . Aneurysm Mother        brain  . Transient ischemic attack Mother        multiple  . Hyperlipidemia Mother   . Hypertension Father   . Heart disease Father   . Mental illness Father   . Depression Father   . Hyperlipidemia Father   . Hyperlipidemia Brother   . Hypertension Brother   . Hypertension Brother   . Asthma Son        Objective:    BP 102/70   Pulse 73   Temp 98 F (36.7 C) (Oral)   Resp 16   Ht 5' 4.96" (1.65 m)   Wt 150 lb (68 kg)   SpO2 98%   BMI 24.99 kg/m  Physical Exam  Constitutional: She is oriented to person, place, and time. She appears well-developed and well-nourished. No distress.  HENT:  Head: Normocephalic and atraumatic.  Eyes: Conjunctivae are normal. Pupils are equal, round, and reactive to light.  Neck: Normal range of motion. Neck supple.  Cardiovascular: Normal rate, regular rhythm and normal heart sounds. Exam reveals no gallop and no friction rub.  No murmur heard. Pulmonary/Chest: Effort normal and breath sounds normal. She has no wheezes. She has no rales.  Neurological: She is alert and oriented to person, place, and time. No cranial nerve deficit. She exhibits normal muscle tone. Coordination normal.  Skin: She is not diaphoretic.  Psychiatric: She has a normal mood and affect. Her behavior is normal. Judgment and thought content normal.  Nursing note and vitals reviewed.  No results found. Depression screen [facility] 2/9 01/30/2018 12/12/2017 11/04/2017 08/17/2017 06/07/2017  Decreased Interest 0 0 1 0 0  Down, Depressed, Hopeless 0 0 1 0 0  PHQ - 2 Score 0 0 2 0 0  Altered sleeping - - 0 - -  Tired, decreased energy - - 1 - -  Change in appetite - - 0 - -  Feeling bad or failure about yourself  - - 1 - -  Trouble concentrating - - 0 - -  Moving slowly or  fidgety/restless - - 0 - -  Suicidal thoughts - - 0 - -  PHQ-9 Score - - 4 - -   Fall Risk  01/30/2018 12/12/2017 11/04/2017 08/17/2017 06/07/2017  Falls in the past year? No No No No No  Number falls in past yr: - - - - -  Injury with Fall? - - - - -        Assessment & Plan:   1. Depression with anxiety   2. Chondromalacia of right patella    Depression with anxiety: Stable with ongoing personal stressors.  Change Wellbutrin to Wellbutrin extended release 150 mg daily.  Continue with psychotherapy and increase frequency when possible.  Limited ability to exercise for stress management due to recent knee pain.  Status post gynecological follow-up; no concern for hormonal abnormalities contributing to current emotional lability.  Right knee pain history of chondromalacia: Status post orthopedic consultation.  Status post recent MRI with upcoming follow-up.  Interfering with ability to exercise regularly  No orders of the defined types were placed in this encounter.  Meds ordered this encounter  Medications  . buPROPion (WELLBUTRIN XL) 150 MG 24 hr tablet    Sig: Take 1 tablet (150 mg total) by mouth daily.    Dispense:  30 tablet    Refill:  5    Return in about 3 months (around 04/29/2018) for recheck .   Quentyn Kolbeck Elayne Guerin, M.D. Primary Care at [facility] previously [facility] [place] [address] [place], [place]  [address] [phone] phone [phone] fax

## 2018-01-30 NOTE — Patient Instructions (Signed)
     IF you received an x-ray today, you will receive an invoice from [facility]. Please contact [facility] at [phone] with questions or concerns regarding your invoice.   IF you received labwork today, you will receive an invoice from [facility]. Please contact [facility] at [phone] with questions or concerns regarding your invoice.   Our billing staff will not be able to assist you with questions regarding bills from these companies.  You will be contacted with the lab results as soon as they are available. The fastest way to get your results is to activate your My Chart account. Instructions are located on the last page of this paperwork. If you have not heard from us regarding the results in 2 weeks, please contact this office.     

## 2018-02-21 ENCOUNTER — Encounter: Payer: Self-pay | Admitting: Family Medicine

## 2018-03-21 ENCOUNTER — Ambulatory Visit (INDEPENDENT_AMBULATORY_CARE_PROVIDER_SITE_OTHER): Payer: BC Managed Care – PPO | Admitting: Physician Assistant

## 2018-03-21 ENCOUNTER — Other Ambulatory Visit: Payer: Self-pay

## 2018-03-21 ENCOUNTER — Encounter: Payer: Self-pay | Admitting: Physician Assistant

## 2018-03-21 VITALS — BP 118/74 | HR 74 | Temp 97.9°F | Resp 16 | Ht 64.96 in | Wt 151.4 lb

## 2018-03-21 DIAGNOSIS — R350 Frequency of micturition: Secondary | ICD-10-CM | POA: Diagnosis not present

## 2018-03-21 DIAGNOSIS — R8290 Unspecified abnormal findings in urine: Secondary | ICD-10-CM

## 2018-03-21 LAB — POCT URINALYSIS DIP (MANUAL ENTRY)
Bilirubin, UA: NEGATIVE
Blood, UA: NEGATIVE
Glucose, UA: NEGATIVE mg/dL
Ketones, POC UA: NEGATIVE mg/dL
Leukocytes, UA: NEGATIVE
Nitrite, UA: NEGATIVE
Protein Ur, POC: NEGATIVE mg/dL
Spec Grav, UA: <=1.005 — AB (ref 1.010–1.025)
Urobilinogen, UA: 0.2 E.U./dL
pH, UA: 6.5 (ref 5.0–8.0)

## 2018-03-21 MED ORDER — CIPROFLOXACIN HCL 250 MG PO TABS: 250.0000 mg | ORAL_TABLET | Freq: Two times a day (BID) | ORAL | 0 refills | Status: AC

## 2018-03-21 NOTE — Patient Instructions (Signed)
     IF you received an x-ray today, you will receive an invoice from [facility]. Please contact [facility] at [phone] with questions or concerns regarding your invoice.   IF you received labwork today, you will receive an invoice from [facility]. Please contact [facility] at [phone] with questions or concerns regarding your invoice.   Our billing staff will not be able to assist you with questions regarding bills from these companies.  You will be contacted with the lab results as soon as they are available. The fastest way to get your results is to activate your My Chart account. Instructions are located on the last page of this paperwork. If you have not heard from us regarding the results in 2 weeks, please contact this office.     

## 2018-03-21 NOTE — Progress Notes (Signed)
Patient ID: Angela Morgan, female    DOB: 1971-03-23, 47 y.o.   MRN: [id number]  PCP: Wardell Honour, MD  Chief Complaint  Patient presents with  . Urinary Tract Infection    Subjective:   Presents for evaluation of urinary symptoms.  Reports increased urinary frequency, fatigue, subjective fever x 5 days. Urine was malodorous and looked cloudy. Took OTC AZO and AZO + Antibacterial protection. Then found a leftover Bactrim, from a previous prescription, which seemed to help. Continues to experience pressure, incomplete emptying, malodorous.  She has a history of frequent UTI, which improved following hysterectomy for endometriosis. During that time, interstitial cystitis was diagnosed. UCx 08/2017 was NO GROWTH, 06/2017 Mixed urogenital flora.    Review of Systems As above    Patient Active Problem List   Diagnosis Date Noted  . Chondromalacia of right patella 01/13/2018  . Migraine headache 03/15/2013  . Premature menopause on HRT 01/09/2013  . 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 Depression with anxiety 08/04/2012  . Insomnia 08/04/2012     Prior to Admission medications   Medication Sig Start Date End Date Taking? Authorizing Provider  AMBULATORY NON FORMULARY MEDICATION Progesterone 150 mg SR caps, Take 1 capsule nightly, 6 nights on, 1 night off 7/89/38  Yes Copland, Alicia B, PA-C  AMBULATORY NON FORMULARY MEDICATION Estriol-testosterone ovules 1mg /1mg  Insert 1 ovule vaginally 1-2 times weekly prn sx 12/06/73  Yes Copland, Alicia B, PA-C  buPROPion (WELLBUTRIN XL) 150 MG 24 hr tablet Take 1 tablet (150 mg total) by mouth daily. 01/30/18  Yes Wardell Honour, MD  clonazePAM (KLONOPIN) 0.5 MG tablet Take 1 tablet (0.5 mg total) by mouth 2 (two) times daily as needed for anxiety. 11/04/17  Yes Wardell Honour, MD  estradiol (ESTRACE) 1 MG tablet Take 1 tablet (1 mg total) by mouth daily. 12/07/56  Yes Copland, Deirdre Evener, PA-C  ibuprofen (ADVIL,MOTRIN) 600 MG  tablet Take 1 tablet (600 mg total) by mouth every 6 (six) hours as needed. 09/08/14  Yes Junius Creamer, NP  loratadine (CLARITIN) 10 MG tablet Take 10 mg by mouth daily.   Yes [provider]  Multiple Vitamin (MULTIVITAMIN) tablet Take 1 tablet by mouth daily.   Yes [provider]  pantoprazole (PROTONIX) 40 MG tablet Take 1 tablet (40 mg total) by mouth daily. 03/08/17  Yes Wardell Honour, MD  ranitidine (ZANTAC) 150 MG tablet Take 1 tablet (150 mg total) by mouth 2 (two) times daily. 03/08/17  Yes Wardell Honour, MD     Allergies  Allergen Reactions  . Biaxin [Clarithromycin] Anaphylaxis       Objective:  Physical Exam  Constitutional: She is oriented to person, place, and time. Vital signs are normal. She appears well-developed and well-nourished. No distress.  HENT:  Head: Normocephalic and atraumatic.  Cardiovascular: Normal rate, regular rhythm and normal heart sounds.  Pulmonary/Chest: Effort normal and breath sounds normal.  Abdominal: Soft. Normal appearance and bowel sounds are normal. She exhibits no distension and no mass. There is no hepatosplenomegaly. There is tenderness in the suprapubic area. There is CVA tenderness (though patient also has musculoskeletal tenderness here). There is no rigidity, no rebound, no guarding, no tenderness at McBurney's point and negative Murphy's sign. No hernia.  Musculoskeletal: Normal range of motion.       Lumbar back: Normal.  Neurological: She is alert and oriented to person, place, and time.  Skin: Skin is warm and dry. No rash noted. She is  not diaphoretic. No pallor.  Psychiatric: She has a normal mood and affect. Her speech is normal and behavior is normal. Judgment normal.    Results for orders placed or performed in visit on 03/21/18  POCT urinalysis dipstick  Result Value Ref Range   Color, UA yellow yellow   Clarity, UA clear clear   Glucose, UA negative negative mg/dL   Bilirubin, UA negative negative    Ketones, POC UA negative negative mg/dL   Spec Grav, UA <=1.005 (A) 1.010 - 1.025   Blood, UA negative negative   pH, UA 6.5 5.0 - 8.0   Protein Ur, POC negative negative mg/dL   Urobilinogen, UA 0.2 0.2 or 1.0 E.U./dL   Nitrite, UA Negative Negative   Leukocytes, UA Negative Negative       Assessment & Plan:   1. Frequent urination 2. Abnormal finding in urine Possible UTI, though IA here is normal. She has taken a dose of Bactrim, which complicates interpretation. Given CVA tenderness, treat with quinolone and await UCx. Interstitial cystitis certainly possible. - POCT urinalysis dipstick - ciprofloxacin (CIPRO) 250 MG tablet; Take 1 tablet (250 mg total) by mouth 2 (two) times daily for 5 days.  Dispense: 10 tablet; Refill: 0  - Urine Culture    Return if symptoms worsen or fail to improve.   Fara Chute, PA-C Primary Care at [facility]

## 2018-03-21 NOTE — Progress Notes (Signed)
Subjective:    Patient ID: Angela Morgan, female    DOB: [birth year], 47 y.o.   MRN: [id number] Chief Complaint  Patient presents with  . Urinary Tract Infection    bladder pressure, frequent urination, odor, started last Thursday. taking azo     HPI  47 yo female presents with complaints of bladder pressure, frequency and odor since last Thursday.  Started taking AZO on Thursday. Started taking azo with antibacterial protection on Saturday. On Monday she found one pill of bactrim left in her medicine cabinet and her symptoms got better. Still having incomplete emptying and bladder pressure. Odor is intermittent. Reports some nausea and back pain (b/l). Reports some chills, but have improved since beginning of symptoms.  Patient feels fatigued and has had some night sweats.  Reports drinking a lot of water and a probiotic   Reports a right knee arthroscopy on the 22nd of march which she attributes some of the fatigue too. Denies dysuria, fevers, hematuria.  Review of Systems  Constitutional: Positive for diaphoresis and fatigue. Negative for fever and unexpected weight change.  HENT: Negative.   Eyes: Negative.   Respiratory: Negative for cough, shortness of breath and wheezing.   Cardiovascular: Negative for chest pain and palpitations.  Gastrointestinal: Positive for nausea. Negative for abdominal pain, constipation, diarrhea and vomiting.  Genitourinary: Positive for flank pain, frequency and urgency. Negative for decreased urine volume, difficulty urinating, dysuria and hematuria.  Musculoskeletal: Positive for back pain. Negative for arthralgias, myalgias, neck pain and neck stiffness.  Skin: Negative for color change, pallor, rash and wound.  Allergic/Immunologic: Positive for environmental allergies.  Neurological: Negative for dizziness, syncope, light-headedness and headaches.  Hematological: Negative.   Psychiatric/Behavioral: Negative.        Patient Active  Problem List   Diagnosis Date Noted  . Chondromalacia of right patella 01/13/2018  . Migraine headache 03/15/2013  . Premature menopause on HRT 01/09/2013  . 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 Depression with anxiety 08/04/2012  . Insomnia 08/04/2012   Past Medical History:  Diagnosis Date  . Allergic rhinitis, cause unspecified   . Allergy   . Anxiety   . Depression   . Depressive disorder, not elsewhere classified   . Fibromyalgia   . GERD (gastroesophageal reflux disease)    with esophagitis  . Insomnia, unspecified   . Irritable bowel syndrome   . Migraine, unspecified, without mention of intractable migraine without mention of status migrainosus   . Pilonidal cyst without abscess 03/28/2013  . Postablative ovarian failure   . Symptomatic states associated with artificial menopause   . Vestibular dysfunction   . Vestibular neuritis    Prior to Admission medications   Medication Sig Start Date End Date Taking? Authorizing Provider  AMBULATORY NON FORMULARY MEDICATION Progesterone 150 mg SR caps, Take 1 capsule nightly, 6 nights on, 1 night off 03/22/59  Yes Copland, Alicia B, PA-C  AMBULATORY NON FORMULARY MEDICATION Estriol-testosterone ovules 1mg /1mg  Insert 1 ovule vaginally 1-2 times weekly prn sx 06/05/15  Yes Copland, Alicia B, PA-C  buPROPion (WELLBUTRIN XL) 150 MG 24 hr tablet Take 1 tablet (150 mg total) by mouth daily. 01/30/18  Yes Wardell Honour, MD  clonazePAM (KLONOPIN) 0.5 MG tablet Take 1 tablet (0.5 mg total) by mouth 2 (two) times daily as needed for anxiety. 11/04/17  Yes Wardell Honour, MD  estradiol (ESTRACE) 1 MG tablet Take 1 tablet (1 mg total) by mouth daily. 0/10/93  Yes Copland, Elmo Putt B, PA-C  ibuprofen (ADVIL,MOTRIN)  600 MG tablet Take 1 tablet (600 mg total) by mouth every 6 (six) hours as needed. 09/08/14  Yes Junius Creamer, NP  loratadine (CLARITIN) 10 MG tablet Take 10 mg by mouth daily.   Yes [provider]  Multiple Vitamin  (MULTIVITAMIN) tablet Take 1 tablet by mouth daily.   Yes [provider]  pantoprazole (PROTONIX) 40 MG tablet Take 1 tablet (40 mg total) by mouth daily. 03/08/17  Yes Wardell Honour, MD  ranitidine (ZANTAC) 150 MG tablet Take 1 tablet (150 mg total) by mouth 2 (two) times daily. 03/08/17  Yes Wardell Honour, MD   Allergies  Allergen Reactions  . Biaxin [Clarithromycin] Anaphylaxis       Objective:   Physical Exam  Constitutional:  BP 118/74   Pulse 74   Temp 97.9 F (36.6 C)   Resp 16   Ht 5' 4.96" (1.65 m)   Wt 151 lb 6.4 oz (68.7 kg)   SpO2 99%   BMI 25.23 kg/m     B/L flank pain with palpation, +CVAT  Pain with palpation LLQ and RLQ, Suprapubic.       Assessment & Plan:

## 2018-03-22 LAB — URINE CULTURE

## 2018-03-27 ENCOUNTER — Ambulatory Visit: Payer: BC Managed Care – PPO | Admitting: Physician Assistant

## 2018-03-28 ENCOUNTER — Encounter: Payer: Self-pay | Admitting: Family Medicine

## 2018-04-18 ENCOUNTER — Other Ambulatory Visit: Payer: Self-pay | Admitting: Family Medicine

## 2018-04-27 ENCOUNTER — Encounter: Payer: Self-pay | Admitting: Family Medicine

## 2018-04-28 ENCOUNTER — Other Ambulatory Visit: Payer: Self-pay | Admitting: Family Medicine

## 2018-05-03 ENCOUNTER — Other Ambulatory Visit: Payer: Self-pay

## 2018-05-03 ENCOUNTER — Other Ambulatory Visit: Payer: Self-pay | Admitting: Family Medicine

## 2018-05-03 ENCOUNTER — Encounter: Payer: Self-pay | Admitting: Family Medicine

## 2018-05-03 ENCOUNTER — Ambulatory Visit: Payer: BC Managed Care – PPO | Admitting: Family Medicine

## 2018-05-03 VITALS — BP 118/82 | HR 78 | Temp 98.0°F | Resp 16 | Ht 64.37 in | Wt 147.0 lb

## 2018-05-03 DIAGNOSIS — F5104 Psychophysiologic insomnia: Secondary | ICD-10-CM

## 2018-05-03 DIAGNOSIS — F418 Other specified anxiety disorders: Secondary | ICD-10-CM | POA: Diagnosis not present

## 2018-05-03 NOTE — Patient Instructions (Signed)
     IF you received an x-ray today, you will receive an invoice from [facility]. Please contact [facility] at [phone] with questions or concerns regarding your invoice.   IF you received labwork today, you will receive an invoice from [facility]. Please contact [facility] at [phone] with questions or concerns regarding your invoice.   Our billing staff will not be able to assist you with questions regarding bills from these companies.  You will be contacted with the lab results as soon as they are available. The fastest way to get your results is to activate your My Chart account. Instructions are located on the last page of this paperwork. If you have not heard from us regarding the results in 2 weeks, please contact this office.     

## 2018-05-03 NOTE — Progress Notes (Signed)
Subjective:    Patient ID: Angela Morgan, female    DOB: [birth year], 47 y.o.   MRN: [id number]  05/03/2018  Depression (3 month follow-up)    HPI This 47 y.o. female presents for three month follow-up of ANXIETY/DEPRESSION. Son lives in [place]. Emotionally is good. Randall Hiss is sick; went to Urgent Care; started new job at [employer].  Loves new job.  Ninety seconds from the house.  Psoriasis was gone.  Genital area, palms, legs all resolved.  Life is good.  Now coming back horribly.  Referral to derm/[place].   Work is stressful; quitting next week.  Finishes with SAT on Saturday; interviewed with [employer] last week; waiting on phone call.  Testing starts next week; bullied and intimidated at work.   Applied for [employer].   Has applied everywhere. Sending resume out to everywhere. Still having mommy and grandmommy issues.     [employer]ew[employer]orts; service dog died; went during spring break.  In April. Surgery in March; released; having some issues; not bouncing back as quick.  No muscle in R quad; cannot get R quad to fire; cannot squat.  Bone on bone/horrible arthritis.  Has been attending physical therapy.  Missed physical therapy with testing.  Incorporate cross fit.   Starting with Ludwig Clarks at [employer].   Twice weekly CrossFit.  Incorporate yoga.   Weaned medication; stopped around surgery time.  Stable.  Bald spot is growing back.  Anesthesia caused hair to fall out.   Seeing Celine quite a bit.   Eric happy; traveling more on baseball.  Will be traveling with husband.   BP Readings from Last 3 Encounters:  05/03/18 118/82  03/21/18 118/74  01/30/18 102/70   Wt Readings from Last 3 Encounters:  05/03/18 147 lb (66.7 kg)  03/21/18 151 lb 6.4 oz (68.7 kg)  01/30/18 150 lb (68 kg)   Immunization History  Administered Date(s) Administered  . Influenza Split 09/04/2012  . Influenza,inj,Quad PF,6+ Mos 12/26/2014, 10/20/2015  . Influenza-Unspecified 09/05/2013, 08/12/2017  . PPD Test  01/07/2014  . Tdap 01/06/2011, 05/03/2017    Review of Systems  Constitutional: Negative for activity change, appetite change, chills, diaphoresis, fatigue, fever and unexpected weight change.  HENT: Negative for congestion, dental problem, drooling, ear discharge, ear pain, facial swelling, hearing loss, mouth sores, nosebleeds, postnasal drip, rhinorrhea, sinus pressure, sneezing, sore throat, tinnitus, trouble swallowing and voice change.   Eyes: Negative for photophobia, pain, discharge, redness, itching and visual disturbance.  Respiratory: Negative for apnea, cough, choking, chest tightness, shortness of breath, wheezing and stridor.   Cardiovascular: Negative for chest pain, palpitations and leg swelling.  Gastrointestinal: Negative for abdominal distention, abdominal pain, anal bleeding, blood in stool, constipation, diarrhea, nausea, rectal pain and vomiting.  Endocrine: Negative for cold intolerance, heat intolerance, polydipsia, polyphagia and polyuria.  Genitourinary: Negative for decreased urine volume, difficulty urinating, dyspareunia, dysuria, enuresis, flank pain, frequency, genital sores, hematuria, menstrual problem, pelvic pain, urgency, vaginal bleeding, vaginal discharge and vaginal pain.  Musculoskeletal: Negative for arthralgias, back pain, gait problem, joint swelling, myalgias, neck pain and neck stiffness.  Skin: Negative for color change, pallor, rash and wound.  Allergic/Immunologic: Negative for environmental allergies, food allergies and immunocompromised state.  Neurological: Negative for dizziness, tremors, seizures, syncope, facial asymmetry, speech difficulty, weakness, light-headedness, numbness and headaches.  Hematological: Negative for adenopathy. Does not bruise/bleed easily.  Psychiatric/Behavioral: Positive for dysphoric mood. Negative for agitation, behavioral problems, confusion, decreased concentration, hallucinations, self-injury, sleep disturbance and  suicidal ideas. The patient is  nervous/anxious. The patient is not hyperactive.     Past Medical History:  Diagnosis Date  . Allergic rhinitis, cause unspecified   . Allergy   . Anxiety   . Depression   . Depressive disorder, not elsewhere classified   . Fibromyalgia   . GERD (gastroesophageal reflux disease)    with esophagitis  . Insomnia, unspecified   . Irritable bowel syndrome   . Migraine, unspecified, without mention of intractable migraine without mention of status migrainosus   . Pilonidal cyst without abscess 03/28/2013  . Postablative ovarian failure   . Symptomatic states associated with artificial menopause   . Vestibular dysfunction   . Vestibular neuritis    Past Surgical History:  Procedure Laterality Date  . APPENDECTOMY    . BREAST BIOPSY Left 2015 x2   NEG  . CESAREAN SECTION     x 2  . CHOLECYSTECTOMY    . ESOPHAGOGASTRODUODENOSCOPY (EGD) WITH PROPOFOL N/A 01/12/2016   Procedure: ESOPHAGOGASTRODUODENOSCOPY (EGD) WITH PROPOFOL;  Surgeon: Hulen Luster, MD;  Location: [facility] ENDOSCOPY;  Service: Gastroenterology;  Laterality: N/A;  . KNEE SURGERY     right  . Laparoscopy  1992/1993, 1996/1997   Endometriosis  . TEMPOROMANDIBULAR JOINT SURGERY    . TOTAL ABDOMINAL HYSTERECTOMY     ovaries removed   Allergies  Allergen Reactions  . Biaxin [Clarithromycin] Anaphylaxis   Current Outpatient Medications on File Prior to Visit  Medication Sig Dispense Refill  . AMBULATORY NON FORMULARY MEDICATION Progesterone 150 mg SR caps, Take 1 capsule nightly, 6 nights on, 1 night off 30 capsule 11  . AMBULATORY NON FORMULARY MEDICATION Estriol-testosterone ovules 1mg /1mg  Insert 1 ovule vaginally 1-2 times weekly prn sx 30 Units 3  . buPROPion (WELLBUTRIN XL) 150 MG 24 hr tablet Take 1 tablet (150 mg total) by mouth daily. 30 tablet 5  . clonazePAM (KLONOPIN) 0.5 MG tablet Take 1 tablet (0.5 mg total) by mouth 2 (two) times daily as needed for anxiety. 30 tablet 1  .  diclofenac (VOLTAREN) 75 MG EC tablet Take by mouth.    . estradiol (ESTRACE) 1 MG tablet Take 1 tablet (1 mg total) by mouth daily. 90 tablet 3  . ibuprofen (ADVIL,MOTRIN) 600 MG tablet Take 1 tablet (600 mg total) by mouth every 6 (six) hours as needed. 30 tablet 0  . loratadine (CLARITIN) 10 MG tablet Take 10 mg by mouth daily.    . Multiple Vitamin (MULTIVITAMIN) tablet Take 1 tablet by mouth daily.    . ranitidine (ZANTAC) 150 MG tablet Take 1 tablet (150 mg total) by mouth 2 (two) times daily. 180 tablet 3   No current facility-administered medications on file prior to visit.    Social History   Socioeconomic History  . Marital status: Married    Spouse name: Randall Hiss  . Number of children: 2  . Years of education: college  . Highest education level: Not on file  Occupational History  . Occupation: Physicist, medical    Comment: [employer]  . Financial resource strain: Not on file  . Food insecurity:    Worry: Not on file    Inability: Not on file  . Transportation needs:    Medical: Not on file    Non-medical: Not on file  Tobacco Use  . Smoking status: Never Smoker  . Smokeless tobacco: Never Used  Substance and Sexual Activity  . Alcohol use: Yes    Alcohol/week: 2.4 oz    Types: 4 Glasses  of wine per week  . Drug use: No  . Sexual activity: Yes    Birth control/protection: Post-menopausal, Surgical  Lifestyle  . Physical activity:    Days per week: Not on file    Minutes per session: Not on file  . Stress: Not on file  Relationships  . Social connections:    Talks on phone: Not on file    Gets together: Not on file    Attends religious service: Not on file    Active member of club or organization: Not on file    Attends meetings of clubs or organizations: Not on file    Relationship status: Not on file  . Intimate partner violence:    Fear of current or ex partner: Not on file    Emotionally abused: Not on file    Physically  abused: Not on file    Forced sexual activity: Not on file  Other Topics Concern  . Not on file  Social History Narrative   Marital Status:  Remarried in 03/2015.  Second marriage.        Children:  2 children (23 [name], [place]); no grandchildren.       Living:  living in apartment with husband in [place].  [name] will be going to [employer] in 2018-2019. Joint custody of Ian/stepson.    [name] is in [place].        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      Exercise: exercising sporadically in 2018; yoga and gym once per week.        Always uses seat belts / helmet.       Smoke alarm in the home.       No guns in the home.     Caffeine use: moderate      Education: College                           Family History  Problem Relation Age of Onset  . Diabetes Maternal Grandfather   . Hypertension Mother   . Stroke Mother   . COPD Mother   . Aneurysm Mother        brain  . Transient ischemic attack Mother        multiple  . Hyperlipidemia Mother   . Hypertension Father   . Heart disease Father   . Mental illness Father   . Depression Father   . Hyperlipidemia Father   . Hyperlipidemia Brother   . Hypertension Brother   . Hypertension Brother   . Asthma Son        Objective:    BP 118/82   Pulse 78   Temp 98 F (36.7 C) (Oral)   Resp 16   Ht 5' 4.37" (1.635 m)   Wt 147 lb (66.7 kg)   SpO2 98%   BMI 24.94 kg/m  Physical Exam  Constitutional: She is oriented to person, place, and time. She appears well-developed and well-nourished. No distress.  HENT:  Head: Normocephalic and atraumatic.  Right Ear: External ear normal.  Left Ear: External ear normal.  Nose: Nose normal.  Mouth/Throat: Oropharynx is clear and moist.  Eyes: Pupils are equal, round, and reactive to light. Conjunctivae and EOM are normal.  Neck: Normal range of motion. Neck supple. Carotid bruit is not present. No thyromegaly present.    Cardiovascular: Normal rate, regular rhythm, normal heart sounds and intact distal pulses. Exam  reveals no gallop and no friction rub.  No murmur heard. Pulmonary/Chest: Effort normal and breath sounds normal. She has no wheezes. She has no rales.  Lymphadenopathy:    She has no cervical adenopathy.  Neurological: She is alert and oriented to person, place, and time. She displays normal reflexes. No cranial nerve deficit or sensory deficit. She exhibits normal muscle tone. Coordination normal.  Skin: Skin is warm and dry. No rash noted. She is not diaphoretic. No erythema. No pallor.  Psychiatric: She has a normal mood and affect. Her behavior is normal. Judgment and thought content normal.   No results found. Depression screen [facility] 2/9 05/03/2018 03/21/2018 01/30/2018 12/12/2017 11/04/2017  Decreased Interest 0 0 0 0 1  Down, Depressed, Hopeless 0 0 0 0 1  PHQ - 2 Score 0 0 0 0 2  Altered sleeping - - - - 0  Tired, decreased energy - - - - 1  Change in appetite - - - - 0  Feeling bad or failure about yourself  - - - - 1  Trouble concentrating - - - - 0  Moving slowly or fidgety/restless - - - - 0  Suicidal thoughts - - - - 0  PHQ-9 Score - - - - 4   Fall Risk  05/03/2018 03/21/2018 01/30/2018 12/12/2017 11/04/2017  Falls in the past year? No No No No No  Number falls in past yr: - - - - -  Injury with Fall? - - - - -        Assessment & Plan:   1. Depression with anxiety   2. Psychophysiological insomnia     Improved depression and anxiety; able to wean medications; plan is to change employment which is major stressor.  Excellent family support.  Continue exercise for stress management.  No orders of the defined types were placed in this encounter.  No orders of the defined types were placed in this encounter.   No follow-ups on file.   Emorie Mcfate Elayne Guerin, M.D. Primary Care at [facility] previously [facility] [address] [place], [place]   [address] [phone] phone [phone] fax

## 2019-02-26 ENCOUNTER — Other Ambulatory Visit: Payer: Self-pay

## 2019-02-26 DIAGNOSIS — Z79890 Hormone replacement therapy (postmenopausal): Secondary | ICD-10-CM

## 2019-02-26 DIAGNOSIS — N951 Menopausal and female climacteric states: Secondary | ICD-10-CM

## 2019-02-26 MED ORDER — ESTRADIOL 1 MG PO TABS: 1.0000 mg | ORAL_TABLET | Freq: Every day | ORAL | 0 refills | Status: DC

## 2019-02-26 NOTE — Telephone Encounter (Signed)
Pt schedule appt 4/21 and needs refill until then.  [phone]  Refilled estradiol eRx.  Pt's vm not set up.

## 2019-03-22 ENCOUNTER — Other Ambulatory Visit: Payer: Self-pay | Admitting: Obstetrics and Gynecology

## 2019-03-22 ENCOUNTER — Telehealth: Payer: Self-pay

## 2019-03-22 DIAGNOSIS — N952 Postmenopausal atrophic vaginitis: Secondary | ICD-10-CM

## 2019-03-22 DIAGNOSIS — N951 Menopausal and female climacteric states: Secondary | ICD-10-CM

## 2019-03-22 DIAGNOSIS — Z79890 Hormone replacement therapy (postmenopausal): Secondary | ICD-10-CM

## 2019-03-22 MED ORDER — AMBULATORY NON FORMULARY MEDICATION: 0 refills | Status: DC

## 2019-03-22 NOTE — Telephone Encounter (Signed)
Rx faxed. Can take hair and nail vitamins.

## 2019-03-22 NOTE — Telephone Encounter (Signed)
Pt will r/s annual, needs progesterone and testosterone RF's please ([facility], [facility]). Wanted to mention she is having problems with her hair growing, any advise?

## 2019-03-22 NOTE — Telephone Encounter (Signed)
Pt aware.

## 2019-03-22 NOTE — Progress Notes (Signed)
Rx RF HRT until annual

## 2019-03-27 ENCOUNTER — Ambulatory Visit: Payer: BC Managed Care – PPO | Admitting: Obstetrics and Gynecology

## 2019-05-22 NOTE — Progress Notes (Deleted)
PCP:  No primary care provider on file.   No chief complaint on file.    HPI:      [name] is a 48 y.o. [phone] who LMP was No LMP recorded. Patient has had a hysterectomy., presents today for her annual examination.  Her menses are absent due to hyst. She does not have intermenstrual bleeding. She has had increased vasomotor sx the past 2 months. Prior to this, her sx were controlled with estradiol 1 mg daily and prog 150 mg SR caps, 6 nights on, 1 night off. Pt has been under increased stress since 11/18 and has also noted wt gain, hair loss, and decreased libido with vasomotor sx. She was started on lexapro with PCP, then changed to wellbutrin a month ago. Pt not adjusting well to "empty nest". Seeing therapist, too.  Sex activity: single partner, contraception - post menopausal status. She uses estradiol/testosterone vag supp 2-3 times weekly for vag dryness sx. She is also using lubricants with sx relief.  Last Pap: December 20, 2016  Results were: no abnormalities /neg HPV DNA  Hx of STDs: none  Last mammogram: January 28, 2017  Results were: normal--routine follow-up in 12 months There is no FH of breast cancer. There is a FH of ovarian cancer in her mat grt aunt, genetic testing not indicated. The patient does do self-breast exams.  Tobacco use: The patient denies current or previous tobacco use. Alcohol use: none No drug use.  Exercise: moderately active  She does get adequate calcium and Vitamin D in her diet.  Labs with PCP.   Past Medical History:  Diagnosis Date  . Allergic rhinitis, cause unspecified   . Allergy   . Anxiety   . Depression   . Depressive disorder, not elsewhere classified   . Fibromyalgia   . GERD (gastroesophageal reflux disease)    with esophagitis  . Insomnia, unspecified   . Irritable bowel syndrome   . Migraine, unspecified, without mention of intractable migraine without mention of status migrainosus   . Pilonidal cyst  without abscess 03/28/2013  . Postablative ovarian failure   . Symptomatic states associated with artificial menopause   . Vestibular dysfunction   . Vestibular neuritis     Past Surgical History:  Procedure Laterality Date  . APPENDECTOMY    . BREAST BIOPSY Left 2015 x2   NEG  . CESAREAN SECTION     x 2  . CHOLECYSTECTOMY    . ESOPHAGOGASTRODUODENOSCOPY (EGD) WITH PROPOFOL N/A 01/12/2016   Procedure: ESOPHAGOGASTRODUODENOSCOPY (EGD) WITH PROPOFOL;  Surgeon: Hulen Luster, MD;  Location: [facility] ENDOSCOPY;  Service: Gastroenterology;  Laterality: N/A;  . KNEE SURGERY     right  . Laparoscopy  1992/1993, 1996/1997   Endometriosis  . TEMPOROMANDIBULAR JOINT SURGERY    . TOTAL ABDOMINAL HYSTERECTOMY     ovaries removed    Family History  Problem Relation Age of Onset  . Diabetes Maternal Grandfather   . Hypertension Mother   . Stroke Mother   . COPD Mother   . Aneurysm Mother        brain  . Transient ischemic attack Mother        multiple  . Hyperlipidemia Mother   . Hypertension Father   . Heart disease Father   . Mental illness Father   . Depression Father   . Hyperlipidemia Father   . Hyperlipidemia Brother   . Hypertension Brother   . Hypertension Brother   . Asthma Son  Social History   Socioeconomic History  . Marital status: Married    Spouse name: Randall Hiss  . Number of children: 2  . Years of education: college  . Highest education level: Not on file  Occupational History  . Occupation: Physicist, medical    Comment: [employer]  . Financial resource strain: Not on file  . Food insecurity    Worry: Not on file    Inability: Not on file  . Transportation needs    Medical: Not on file    Non-medical: Not on file  Tobacco Use  . Smoking status: Never Smoker  . Smokeless tobacco: Never Used  Substance and Sexual Activity  . Alcohol use: Yes    Alcohol/week: 4.0 standard drinks    Types: 4 Glasses of wine per week  . Drug  use: No  . Sexual activity: Yes    Birth control/protection: Post-menopausal, Surgical  Lifestyle  . Physical activity    Days per week: Not on file    Minutes per session: Not on file  . Stress: Not on file  Relationships  . Social Herbalist on phone: Not on file    Gets together: Not on file    Attends religious service: Not on file    Active member of club or organization: Not on file    Attends meetings of clubs or organizations: Not on file    Relationship status: Not on file  . Intimate partner violence    Fear of current or ex partner: Not on file    Emotionally abused: Not on file    Physically abused: Not on file    Forced sexual activity: Not on file  Other Topics Concern  . Not on file  Social History Narrative   Marital Status:  Remarried in 03/2015.  Second marriage.        Children:  2 children (23 [name], [place]); no grandchildren.       Living:  living in apartment with husband in [place].  Angela Morgan will be going to [employer] in 2018-2019. Joint custody of Ian/stepson.    [name] is in [place].        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      Exercise: exercising sporadically in 2018; yoga and gym once per week.        Always uses seat belts / helmet.       Smoke alarm in the home.       No guns in the home.     Caffeine use: moderate      Education: College                            No outpatient medications have been marked as taking for the 05/23/19 encounter (Appointment) with Naisha Wisdom, Deirdre Evener, PA-C.     ROS:  Review of Systems  Constitutional: Negative for fatigue, fever and unexpected weight change.  Respiratory: Negative for cough, shortness of breath and wheezing.   Cardiovascular: Negative for chest pain, palpitations and leg swelling.  Gastrointestinal: Negative for blood in stool, constipation, diarrhea, nausea and vomiting.  Endocrine: Negative for cold intolerance, heat  intolerance and polyuria.  Genitourinary: Negative for dyspareunia, dysuria, flank pain, frequency, genital sores, hematuria, menstrual problem, pelvic pain, urgency, vaginal bleeding, vaginal discharge and vaginal pain.  Musculoskeletal: Positive for arthralgias and joint swelling. Negative for back  pain and myalgias.  Skin: Negative for rash.  Neurological: Negative for dizziness, syncope, light-headedness, numbness and headaches.  Hematological: Negative for adenopathy.  Psychiatric/Behavioral: Negative for agitation, confusion, sleep disturbance and suicidal ideas. The patient is not nervous/anxious.      Objective: There were no vitals taken for this visit.   Physical Exam Constitutional:      Appearance: She is well-developed.  Genitourinary:     Vagina normal.     No vaginal discharge, erythema or tenderness.     No right or left adnexal mass present.     Right adnexa not tender.     Left adnexa not tender.     Genitourinary Comments: UTERUS/CX SURG REM  Neck:     Musculoskeletal: Normal range of motion.     Thyroid: No thyromegaly.  Cardiovascular:     Rate and Rhythm: Normal rate and regular rhythm.     Heart sounds: Normal heart sounds. No murmur.  Pulmonary:     Effort: Pulmonary effort is normal.     Breath sounds: Normal breath sounds.  Chest:     Breasts:        Right: No mass, nipple discharge, skin change or tenderness.        Left: No mass, nipple discharge, skin change or tenderness.  Abdominal:     Palpations: Abdomen is soft.     Tenderness: There is no abdominal tenderness. There is no guarding.  Musculoskeletal: Normal range of motion.  Neurological:     Mental Status: She is alert and oriented to person, place, and time.     Cranial Nerves: No cranial nerve deficit.  Psychiatric:        Behavior: Behavior normal.  Vitals signs reviewed.     Assessment/Plan: No diagnosis found.  No orders of the defined types were placed in this encounter.             GYN counsel breast self exam, mammography screening, menopause, adequate intake of calcium and vitamin D, diet and exercise     F/U  No follow-ups on file.  Lutie Pickler B. Jacquetta Polhamus, PA-C 05/22/2019 11:32 AM

## 2019-05-23 ENCOUNTER — Ambulatory Visit: Payer: BC Managed Care – PPO | Admitting: Obstetrics and Gynecology

## 2019-05-29 ENCOUNTER — Other Ambulatory Visit: Payer: Self-pay | Admitting: Obstetrics and Gynecology

## 2019-05-29 DIAGNOSIS — N951 Menopausal and female climacteric states: Secondary | ICD-10-CM

## 2019-05-29 DIAGNOSIS — Z79890 Hormone replacement therapy (postmenopausal): Secondary | ICD-10-CM

## 2019-06-22 ENCOUNTER — Other Ambulatory Visit: Payer: Self-pay | Admitting: Physician Assistant

## 2019-06-22 DIAGNOSIS — N644 Mastodynia: Secondary | ICD-10-CM

## 2019-06-29 ENCOUNTER — Ambulatory Visit
Admission: RE | Admit: 2019-06-29 | Discharge: 2019-06-29 | Disposition: A | Discharge status: Home / Self Care | Payer: 59 | Source: Ambulatory Visit | Attending: Physician Assistant | Admitting: Physician Assistant

## 2019-06-29 ENCOUNTER — Ambulatory Visit: Payer: BC Managed Care – PPO

## 2019-06-29 ENCOUNTER — Other Ambulatory Visit: Payer: Self-pay

## 2019-06-29 DIAGNOSIS — N644 Mastodynia: Secondary | ICD-10-CM

## 2019-07-26 ENCOUNTER — Telehealth: Payer: Self-pay

## 2019-07-26 NOTE — Telephone Encounter (Signed)
Pt is requesting a refill on all three HRT medications, the two inserts and one cream until she can get in to see [facility]. She rescheduled her annual for 08/22/19 with [facility]. Please advise, thank you

## 2019-07-27 NOTE — Telephone Encounter (Signed)
Pt aware.

## 2019-07-27 NOTE — Telephone Encounter (Signed)
Pls let pt know I have to fax them and will do so Monday. She needs to keep her 9/20 appt or I can't send in any more refills until seen. Thx.

## 2019-07-30 ENCOUNTER — Other Ambulatory Visit: Payer: Self-pay | Admitting: Obstetrics and Gynecology

## 2019-07-30 DIAGNOSIS — N951 Menopausal and female climacteric states: Secondary | ICD-10-CM

## 2019-07-30 DIAGNOSIS — Z79890 Hormone replacement therapy (postmenopausal): Secondary | ICD-10-CM

## 2019-07-30 DIAGNOSIS — N952 Postmenopausal atrophic vaginitis: Secondary | ICD-10-CM

## 2019-07-30 MED ORDER — AMBULATORY NON FORMULARY MEDICATION: 0 refills | Status: DC

## 2019-07-30 MED ORDER — ESTRADIOL 1 MG PO TABS: 1.0000 mg | ORAL_TABLET | Freq: Every day | ORAL | 0 refills | Status: DC

## 2019-07-30 NOTE — Progress Notes (Signed)
Rx RF HRT meds till annual 9/20. Won't RF if doesn't have annual.

## 2019-07-30 NOTE — Telephone Encounter (Signed)
Called pt to let her know Rx's have been faxed to pharmacy, no answer, could not leave a voice msg, never got the tone to record msg.

## 2019-08-22 ENCOUNTER — Ambulatory Visit: Payer: Self-pay | Admitting: Obstetrics and Gynecology

## 2019-09-27 NOTE — Progress Notes (Signed)
PCP:  Patient, No Pcp Per   Chief Complaint  Patient presents with  . Gynecologic Exam    night sweats for the past two months on/off     HPI:      [name] is a 48 y.o. [id number] who LMP was No LMP recorded. Patient has had a hysterectomy., presents today for her annual examination.  Her menses are absent due to hyst. She does not have intermenstrual bleeding. She has had increased vasomotor sx the past 2 months. Prior to this, her sx were fairly well-controlled with estradiol 1 mg daily and prog 150 mg SR caps, 6 nights on, 1 night off. Pt has been under increased stress past 3 wks particularly with a sick brother. Wt gain, hair loss improved from last yr. She was on wellbutrin last yr and was able to go off, but had to restart with recent stress.   Sex activity: single partner, contraception - post menopausal status. She uses estradiol/testosterone vag supp 1-2 times weekly for vag dryness sx with sx relief. She is also using lubricants.  Last Pap: December 20, 2016  Results were: no abnormalities /neg HPV DNA  Hx of STDs: none  Last mammogram: 06/29/19  Results were: normal--routine follow-up in 12 months There is no FH of breast cancer. There is a FH of ovarian cancer in her mat grt aunt, genetic testing not indicated. The patient does do self-breast exams.  Tobacco use: The patient denies current or previous tobacco use. Alcohol use: none No drug use.  Exercise: very active  She does get adequate calcium and Vitamin D in her diet.  Labs with PCP.   Past Medical History:  Diagnosis Date  . Allergic rhinitis, cause unspecified   . Allergy   . Anxiety   . Depression   . Depressive disorder, not elsewhere classified   . Fibromyalgia   . GERD (gastroesophageal reflux disease)    with esophagitis  . Insomnia, unspecified   . Irritable bowel syndrome   . Migraine, unspecified, without mention of intractable migraine without mention of status migrainosus   .  Pilonidal cyst without abscess 03/28/2013  . Postablative ovarian failure   . Symptomatic states associated with artificial menopause   . Vestibular dysfunction   . Vestibular neuritis     Past Surgical History:  Procedure Laterality Date  . APPENDECTOMY    . BILATERAL SALPINGOOPHORECTOMY    . BREAST BIOPSY Left 2015 x2   NEG  . CESAREAN SECTION     x 2  . CHOLECYSTECTOMY    . ESOPHAGOGASTRODUODENOSCOPY (EGD) WITH PROPOFOL N/A 01/12/2016   Procedure: ESOPHAGOGASTRODUODENOSCOPY (EGD) WITH PROPOFOL;  Surgeon: Hulen Luster, MD;  Location: [facility] ENDOSCOPY;  Service: Gastroenterology;  Laterality: N/A;  . KNEE SURGERY     right  . Laparoscopy  1992/1993, 1996/1997   Endometriosis  . TEMPOROMANDIBULAR JOINT SURGERY    . TOTAL ABDOMINAL HYSTERECTOMY     ovaries removed    Family History  Problem Relation Age of Onset  . Diabetes Maternal Grandfather   . Hypertension Mother   . Stroke Mother   . COPD Mother   . Aneurysm Mother        brain  . Transient ischemic attack Mother        multiple  . Hyperlipidemia Mother   . Hypertension Father   . Heart disease Father   . Mental illness Father   . Depression Father   . Hyperlipidemia Father   . Hyperlipidemia Brother   .  Hypertension Brother   . Hypertension Brother   . Diabetes Brother   . Asthma Son     Social History   Socioeconomic History  . Marital status: Married    Spouse name: Randall Hiss  . Number of children: 2  . Years of education: college  . Highest education level: Not on file  Occupational History  . Occupation: Physicist, medical    Comment: [employer]  . Financial resource strain: Not on file  . Food insecurity    Worry: Not on file    Inability: Not on file  . Transportation needs    Medical: Not on file    Non-medical: Not on file  Tobacco Use  . Smoking status: Never Smoker  . Smokeless tobacco: Never Used  Substance and Sexual Activity  . Alcohol use: Yes     Alcohol/week: 4.0 standard drinks    Types: 4 Glasses of wine per week  . Drug use: No  . Sexual activity: Yes    Birth control/protection: Post-menopausal, Surgical  Lifestyle  . Physical activity    Days per week: Not on file    Minutes per session: Not on file  . Stress: Not on file  Relationships  . Social Herbalist on phone: Not on file    Gets together: Not on file    Attends religious service: Not on file    Active member of club or organization: Not on file    Attends meetings of clubs or organizations: Not on file    Relationship status: Not on file  . Intimate partner violence    Fear of current or ex partner: Not on file    Emotionally abused: Not on file    Physically abused: Not on file    Forced sexual activity: Not on file  Other Topics Concern  . Not on file  Social History Narrative   Marital Status:  Remarried in 03/2015.  Second marriage.        Children:  2 children (23 [name], Angela Morgan); no grandchildren.       Living:  living in apartment with husband in [place].  Angela Morgan will be going to [employer] in 2018-2019. Joint custody of Ian/stepson.    [name] is in [place].        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      Exercise: exercising sporadically in 2018; yoga and gym once per week.        Always uses seat belts / helmet.       Smoke alarm in the home.       No guns in the home.     Caffeine use: moderate      Education: College                            Current Meds  Medication Sig  . AMBULATORY NON FORMULARY MEDICATION Progesterone 150 mg SR caps, Take 1 capsule nightly, 6 nights on, 1 night off  . AMBULATORY NON FORMULARY MEDICATION Estriol-testosterone ovules 1mg /1mg  Insert 1 ovule vaginally 1-2 times weekly prn sx  . buPROPion (WELLBUTRIN XL) 150 MG 24 hr tablet Take 1 tablet (150 mg total) by mouth daily.  . Cholecalciferol (VITAMIN D-1000 MAX ST) 25 MCG (1000 UT) tablet Take by  mouth.  . clonazePAM (KLONOPIN) 0.5 MG tablet Take 1 tablet (0.5 mg total) by mouth 2 (  two) times daily as needed for anxiety.  . diclofenac (VOLTAREN) 75 MG EC tablet Take by mouth.  . dicyclomine (BENTYL) 20 MG tablet Take by mouth.  . estradiol (ESTRACE) 1 MG tablet Take 1 tablet (1 mg total) by mouth daily.  Marland Kitchen ibuprofen (ADVIL,MOTRIN) 600 MG tablet Take 1 tablet (600 mg total) by mouth every 6 (six) hours as needed.  . loratadine (CLARITIN) 10 MG tablet Take 10 mg by mouth daily.  . Multiple Vitamin (MULTIVITAMIN) tablet Take 1 tablet by mouth daily.  . [DISCONTINUED] AMBULATORY NON FORMULARY MEDICATION Progesterone 150 mg SR caps, Take 1 capsule nightly, 6 nights on, 1 night off  . [DISCONTINUED] AMBULATORY NON FORMULARY MEDICATION Estriol-testosterone ovules 1mg /1mg  Insert 1 ovule vaginally 1-2 times weekly prn sx  . [DISCONTINUED] estradiol (ESTRACE) 1 MG tablet Take 1 tablet (1 mg total) by mouth daily.     ROS:  Review of Systems  Constitutional: Negative for fatigue, fever and unexpected weight change.  Respiratory: Negative for cough, shortness of breath and wheezing.   Cardiovascular: Negative for chest pain, palpitations and leg swelling.  Gastrointestinal: Negative for blood in stool, constipation, diarrhea, nausea and vomiting.  Endocrine: Negative for cold intolerance, heat intolerance and polyuria.  Genitourinary: Negative for dyspareunia, dysuria, flank pain, frequency, genital sores, hematuria, menstrual problem, pelvic pain, urgency, vaginal bleeding, vaginal discharge and vaginal pain.  Musculoskeletal: Negative for arthralgias, back pain, joint swelling and myalgias.  Skin: Negative for rash.  Neurological: Negative for dizziness, syncope, light-headedness, numbness and headaches.  Hematological: Negative for adenopathy.  Psychiatric/Behavioral: Positive for agitation and dysphoric mood. Negative for confusion, sleep disturbance and suicidal ideas. The patient is not  nervous/anxious.      Objective: BP 90/60   Ht 5\' 4"  (1.626 m)   Wt 156 lb (70.8 kg)   BMI 26.78 kg/m    Physical Exam Constitutional:      Appearance: She is well-developed.  Genitourinary:     Vulva and vagina normal.     No vaginal discharge, erythema or tenderness.     Cervix is absent.     Uterus is not tender.     Uterus is absent.     No right or left adnexal mass present.     Right adnexa absent.     Right adnexa not tender.     Left adnexa absent.     Left adnexa not tender.     Genitourinary Comments: UTERUS/CX SURG REM  Neck:     Musculoskeletal: Normal range of motion.     Thyroid: No thyromegaly.  Cardiovascular:     Rate and Rhythm: Normal rate and regular rhythm.     Heart sounds: Normal heart sounds. No murmur.  Pulmonary:     Effort: Pulmonary effort is normal.     Breath sounds: Normal breath sounds.  Chest:     Breasts:        Right: No mass, nipple discharge, skin change or tenderness.        Left: No mass, nipple discharge, skin change or tenderness.  Abdominal:     Palpations: Abdomen is soft.     Tenderness: There is no abdominal tenderness. There is no guarding.  Musculoskeletal: Normal range of motion.  Neurological:     General: No focal deficit present.     Mental Status: She is alert and oriented to person, place, and time.     Cranial Nerves: No cranial nerve deficit.  Skin:    General: Skin is warm and  dry.  Psychiatric:        Mood and Affect: Mood normal.        Behavior: Behavior normal.        Thought Content: Thought content normal.        Judgment: Judgment normal.  Vitals signs reviewed.     Assessment/Plan: Encounter for annual routine gynecological examination  Encounter for screening mammogram for malignant neoplasm of breast; pt current on mammo  Hormone replacement therapy (HRT) - Rx RF. Estradiol to [facility]. Vag supp and prog sent to [facility] for compouding. - Plan: AMBULATORY NON FORMULARY MEDICATION,  AMBULATORY NON FORMULARY MEDICATION, estradiol (ESTRACE) 1 MG tablet  Vasomotor symptoms due to menopause - Sx increase most likely due to increased stress.  Sx fairly controlled with current dose before stress. F/u prn sx. May need to increase prog to 200 mg QHS.  F/u prn.  - Plan: AMBULATORY NON FORMULARY MEDICATION, estradiol (ESTRACE) 1 MG tablet  Postmenopausal atrophic vaginitis - Vag tissue well-estrogenated. Cont HRT vag supp, 1-2 times weekly. Rx sent to [facility]. - Plan: AMBULATORY NON FORMULARY MEDICATION  Meds ordered this encounter  Medications  . AMBULATORY NON FORMULARY MEDICATION    Sig: Progesterone 150 mg SR caps, Take 1 capsule nightly, 6 nights on, 1 night off    Dispense:  30 capsule    Refill:  11    Order Specific Question:   Supervising Provider    Answer:   Gae Dry [id number]  . AMBULATORY NON FORMULARY MEDICATION    Sig: Estriol-testosterone ovules 1mg /1mg  Insert 1 ovule vaginally 1-2 times weekly prn sx    Dispense:  30 Units    Refill:  3    Order Specific Question:   Supervising Provider    Answer:   Gae Dry [id number]  . estradiol (ESTRACE) 1 MG tablet    Sig: Take 1 tablet (1 mg total) by mouth daily.    Dispense:  90 tablet    Refill:  3    Order Specific Question:   Supervising Provider    Answer:   Gae Dry [id number]             GYN counsel breast self exam, mammography screening, menopause, adequate intake of calcium and vitamin D, diet and exercise     F/U  Return in about 1 year (around 09/27/2020).   B. , PA-C 09/28/2019 10:50 AM

## 2019-09-28 ENCOUNTER — Other Ambulatory Visit: Payer: Self-pay

## 2019-09-28 ENCOUNTER — Encounter: Payer: Self-pay | Admitting: Obstetrics and Gynecology

## 2019-09-28 ENCOUNTER — Ambulatory Visit (INDEPENDENT_AMBULATORY_CARE_PROVIDER_SITE_OTHER): Payer: 59 | Admitting: Obstetrics and Gynecology

## 2019-09-28 ENCOUNTER — Encounter

## 2019-09-28 VITALS — BP 90/60 | Ht 64.0 in | Wt 156.0 lb

## 2019-09-28 DIAGNOSIS — N951 Menopausal and female climacteric states: Secondary | ICD-10-CM

## 2019-09-28 DIAGNOSIS — Z79890 Hormone replacement therapy (postmenopausal): Secondary | ICD-10-CM

## 2019-09-28 DIAGNOSIS — Z01419 Encounter for gynecological examination (general) (routine) without abnormal findings: Secondary | ICD-10-CM | POA: Diagnosis not present

## 2019-09-28 DIAGNOSIS — Z1231 Encounter for screening mammogram for malignant neoplasm of breast: Secondary | ICD-10-CM

## 2019-09-28 DIAGNOSIS — N952 Postmenopausal atrophic vaginitis: Secondary | ICD-10-CM

## 2019-09-28 MED ORDER — AMBULATORY NON FORMULARY MEDICATION: 3 refills | Status: DC

## 2019-09-28 MED ORDER — AMBULATORY NON FORMULARY MEDICATION: 11 refills | Status: DC

## 2019-09-28 MED ORDER — ESTRADIOL 1 MG PO TABS: 1.0000 mg | ORAL_TABLET | Freq: Every day | ORAL | 3 refills | Status: DC

## 2019-09-28 NOTE — Patient Instructions (Signed)
I value your feedback and entrusting us with your care. If you get a [facility] patient survey, I would appreciate you taking the time to let us know about your experience today. Thank you! 

## 2019-12-04 ENCOUNTER — Telehealth: Payer: Self-pay

## 2019-12-04 NOTE — Telephone Encounter (Signed)
Pt is calling triage today requesting a refill on progesterone sent to [facility]. Please advise, I am not seeing it on current med list.pt aware via vm you are not in office until tomorrow.

## 2019-12-04 NOTE — Telephone Encounter (Signed)
1 yr RF faxed to [facility] at 10/20 appt. Has she checked with pharm to make sure they don't have it? If not, I will fax again tomorrow (can't be eRxd). Just let me know. THx

## 2019-12-04 NOTE — Telephone Encounter (Signed)
Pt states pharm does not have it. She is aware you will refax it in the morning. Thank you

## 2019-12-05 ENCOUNTER — Other Ambulatory Visit: Payer: Self-pay | Admitting: Obstetrics and Gynecology

## 2019-12-05 DIAGNOSIS — Z79890 Hormone replacement therapy (postmenopausal): Secondary | ICD-10-CM

## 2019-12-05 DIAGNOSIS — N951 Menopausal and female climacteric states: Secondary | ICD-10-CM

## 2019-12-05 MED ORDER — AMBULATORY NON FORMULARY MEDICATION: 9 refills | Status: DC

## 2019-12-05 NOTE — Progress Notes (Signed)
Rx progesterone needs to be refaxed to [facility].

## 2019-12-05 NOTE — Telephone Encounter (Signed)
Pls fax to [facility] and ask pt to verify they received it. F/u prn.

## 2021-01-14 ENCOUNTER — Encounter: Payer: Self-pay | Admitting: Gastroenterology

## 2021-01-28 ENCOUNTER — Ambulatory Visit (INDEPENDENT_AMBULATORY_CARE_PROVIDER_SITE_OTHER): Payer: 59 | Admitting: Gastroenterology

## 2021-01-28 ENCOUNTER — Other Ambulatory Visit: Payer: Self-pay

## 2021-01-28 ENCOUNTER — Encounter: Payer: Self-pay | Admitting: Gastroenterology

## 2021-01-28 VITALS — BP 110/80 | HR 67 | Ht 64.0 in | Wt 166.0 lb

## 2021-01-28 DIAGNOSIS — K589 Irritable bowel syndrome without diarrhea: Secondary | ICD-10-CM | POA: Diagnosis not present

## 2021-01-28 DIAGNOSIS — Z1211 Encounter for screening for malignant neoplasm of colon: Secondary | ICD-10-CM | POA: Insufficient documentation

## 2021-01-28 DIAGNOSIS — Z8719 Personal history of other diseases of the digestive system: Secondary | ICD-10-CM

## 2021-01-28 DIAGNOSIS — K219 Gastro-esophageal reflux disease without esophagitis: Secondary | ICD-10-CM | POA: Diagnosis not present

## 2021-01-28 MED ORDER — DEXLANSOPRAZOLE 60 MG PO CPDR: 60.0000 mg | DELAYED_RELEASE_CAPSULE | Freq: Every day | ORAL | 3 refills | Status: DC

## 2021-01-28 MED ORDER — SUTAB 1479-225-188 MG PO TABS: 24.0000 | ORAL_TABLET | ORAL | 0 refills | Status: DC

## 2021-01-28 NOTE — Patient Instructions (Addendum)
We have sent the following medications to your pharmacy for you to pick up at your convenience: Loney Laurence   If you are age 50 or younger, your body mass index should be between 19-25. Your Body mass index is 28.49 kg/m. If this is out of the aformentioned range listed, please consider follow up with your Primary Care Provider.   You have been scheduled for an endoscopy and colonoscopy. Please follow the written instructions given to you at your visit today. Please pick up your prep supplies at the pharmacy within the next 1-3 days. If you use inhalers (even only as needed), please bring them with you on the day of your procedure.   START: Dexilant - Take 1 capsule once daily   Thank you for choosing me and [facility].  Jessica Zehr-PA-C

## 2021-01-28 NOTE — Progress Notes (Addendum)
01/28/2021 Angela Morgan [id number] Jun 24, 1971   HISTORY OF PRESENT ILLNESS: This is a 50 year old female who is new to our office.  She was previously followed by GI in [place] through [facility].  She does mention a history of GERD and IBS.  It looks like she had a colonoscopy in 2008, but I cannot see the actual report.  It was entered as normal, but she tells me that she thinks she had some polyps removed.  She had an EGD in February 2017 showed LA grade A esophagitis, but was otherwise normal.  Biopsy showed mild esophagitis and intestinal metaplasia.  It was recommended that she have a repeat EGD in 3 years.  She presents here today with complaints of a flare in her reflux symptoms.  She says that she went to the country in December and ate terribly and just has not been able to get back on track since then and her get her symptoms under control.  She took some over-the-counter Nexium while she was in [place] and then took some pantoprazole here, but symptoms have not resolved.  She also reports some issues with constipation, but is trying to get her diet back on track and start working out again, etc.  She takes a daily fiber supplement and probiotic.  She denies seeing blood in her stool.   Past Medical History:  Diagnosis Date  . Allergic rhinitis, cause unspecified   . Allergy   . Anxiety   . Depression   . Depressive disorder, not elsewhere classified   . Fibromyalgia   . GERD (gastroesophageal reflux disease)    with esophagitis  . Insomnia, unspecified   . Irritable bowel syndrome   . Migraine, unspecified, without mention of intractable migraine without mention of status migrainosus   . Pilonidal cyst without abscess 03/28/2013  . Postablative ovarian failure   . Symptomatic states associated with artificial menopause   . Vestibular dysfunction   . Vestibular neuritis    Past Surgical History:  Procedure Laterality Date  . APPENDECTOMY    .  BILATERAL SALPINGOOPHORECTOMY    . BREAST BIOPSY Left 2015 x2   NEG  . CESAREAN SECTION     x 2  . CHOLECYSTECTOMY    . ESOPHAGOGASTRODUODENOSCOPY (EGD) WITH PROPOFOL N/A 01/12/2016   Procedure: ESOPHAGOGASTRODUODENOSCOPY (EGD) WITH PROPOFOL;  Surgeon: Hulen Luster, MD;  Location: [facility] ENDOSCOPY;  Service: Gastroenterology;  Laterality: N/A;  . KNEE SURGERY     right  . Laparoscopy  1992/1993, 1996/1997   Endometriosis  . TEMPOROMANDIBULAR JOINT SURGERY    . TOTAL ABDOMINAL HYSTERECTOMY     ovaries removed    reports that she has never smoked. She has never used smokeless tobacco. She reports current alcohol use of about 4.0 standard drinks of alcohol per week. She reports that she does not use drugs. family history includes Aneurysm in her mother; Asthma in her son; COPD in her mother; Depression in her father; Diabetes in her brother and maternal grandfather; Heart disease in her father; Hyperlipidemia in her brother, father, and mother; Hypertension in her brother, brother, father, and mother; Mental illness in her father; Stroke in her mother; Transient ischemic attack in her mother. Allergies  Allergen Reactions  . Biaxin [Clarithromycin] Anaphylaxis      Outpatient Encounter Medications as of 01/28/2021  Medication Sig  . AMBULATORY NON FORMULARY MEDICATION Progesterone 150 mg SR caps, Take 1 capsule nightly, 6 nights on, 1 night off  . buPROPion [facility]  XL) 150 MG 24 hr tablet Take 1 tablet (150 mg total) by mouth daily.  . Cholecalciferol (VITAMIN D-1000 MAX ST) 25 MCG (1000 UT) tablet Take by mouth.  . dicyclomine (BENTYL) 20 MG tablet Take by mouth.  . eletriptan (RELPAX) 40 MG tablet Take 1 tablet by mouth daily as needed.  Marland Kitchen Galcanezumab-gnlm (EMGALITY) 120 MG/ML SOAJ Inject into the skin as directed.  Marland Kitchen ibuprofen (ADVIL,MOTRIN) 600 MG tablet Take 1 tablet (600 mg total) by mouth every 6 (six) hours as needed.  . loratadine (CLARITIN) 10 MG tablet Take 10 mg by mouth daily.   . Multiple Vitamin (MULTIVITAMIN) tablet Take 1 tablet by mouth daily.  . [DISCONTINUED] AMBULATORY NON FORMULARY MEDICATION Estriol-testosterone ovules 1mg /1mg  Insert 1 ovule vaginally 1-2 times weekly prn sx  . [DISCONTINUED] clonazePAM (KLONOPIN) 0.5 MG tablet Take 1 tablet (0.5 mg total) by mouth 2 (two) times daily as needed for anxiety.  . [DISCONTINUED] diclofenac (VOLTAREN) 75 MG EC tablet Take by mouth.  . [DISCONTINUED] estradiol (ESTRACE) 1 MG tablet Take 1 tablet (1 mg total) by mouth daily.   No facility-administered encounter medications on file as of 01/28/2021.    REVIEW OF SYSTEMS  : All other systems reviewed and negative except where noted in the History of Present Illness.   PHYSICAL EXAM: BP 110/80 (BP Location: Left Arm, Patient Position: Sitting, Cuff Size: Normal)   Pulse 67   Ht 5\' 4"  (1.626 m)   Wt 166 lb (75.3 kg)   BMI 28.49 kg/m  General: Well developed white female in no acute distress Head: Normocephalic and atraumatic Eyes:  Sclerae anicteric, conjunctiva pink. Ears: Normal auditory acuity Lungs: Clear throughout to auscultation; no W/R/R. Heart: Regular rate and rhythm; no M/R/G. Abdomen: Soft, non-distended.  BS present.  Mild epigastric TTP. Rectal:  Will be done at the time of colonoscopy. Musculoskeletal: Symmetrical with no gross deformities  Skin: No lesions on visible extremities Extremities: No edema  Neurological: Alert oriented x 4, grossly non-focal Psychological:  Alert and cooperative. Normal mood and affect  ASSESSMENT AND PLAN: *Colorectal cancer screening: Last colonoscopy 14 years ago.  We will schedule with Dr. Havery Moros. *GERD with history of esophagitis and biopsies suggesting Barretts in 2017: Had not been on any medication until recently.  No improvement with OTC Nexium or prescription pantoprazole.  We will try for Dexilant 60 mg daily.  Prescription sent to pharmacy.  We will plan for EGD with Dr. Havery Moros. *Mild  constipation: She take fiber capsules and probiotic daily.  We discussed adding MiraLAX daily to her regimen.  **The risks, benefits, and alternatives to EGD and colonoscopy were discussed with the patient and she consents to proceed.   CC:  No ref. provider found

## 2021-01-29 NOTE — Progress Notes (Signed)
Agree with assessment and plan as outlined.  

## 2021-02-04 NOTE — Telephone Encounter (Signed)
Angela Morgan which PPI would you prefer the pt try.  See the list her insurance will cover.

## 2021-02-05 ENCOUNTER — Other Ambulatory Visit: Payer: Self-pay

## 2021-02-05 MED ORDER — RABEPRAZOLE SODIUM 20 MG PO TBEC: 20.0000 mg | DELAYED_RELEASE_TABLET | Freq: Every day | ORAL | 3 refills | Status: DC

## 2021-03-13 ENCOUNTER — Ambulatory Visit (AMBULATORY_SURGERY_CENTER): Payer: 59 | Admitting: Gastroenterology

## 2021-03-13 ENCOUNTER — Other Ambulatory Visit: Payer: Self-pay

## 2021-03-13 ENCOUNTER — Encounter: Payer: Self-pay | Admitting: Gastroenterology

## 2021-03-13 VITALS — BP 131/87 | HR 59 | Temp 97.8°F | Resp 15 | Ht 64.0 in | Wt 166.0 lb

## 2021-03-13 DIAGNOSIS — K219 Gastro-esophageal reflux disease without esophagitis: Secondary | ICD-10-CM | POA: Diagnosis not present

## 2021-03-13 DIAGNOSIS — Z860100 Personal history of colon polyps, unspecified: Secondary | ICD-10-CM

## 2021-03-13 DIAGNOSIS — K31819 Angiodysplasia of stomach and duodenum without bleeding: Secondary | ICD-10-CM

## 2021-03-13 DIAGNOSIS — K2970 Gastritis, unspecified, without bleeding: Secondary | ICD-10-CM | POA: Diagnosis not present

## 2021-03-13 DIAGNOSIS — K3189 Other diseases of stomach and duodenum: Secondary | ICD-10-CM | POA: Diagnosis not present

## 2021-03-13 DIAGNOSIS — K317 Polyp of stomach and duodenum: Secondary | ICD-10-CM

## 2021-03-13 DIAGNOSIS — K2270 Barrett's esophagus without dysplasia: Secondary | ICD-10-CM

## 2021-03-13 DIAGNOSIS — K449 Diaphragmatic hernia without obstruction or gangrene: Secondary | ICD-10-CM | POA: Diagnosis not present

## 2021-03-13 MED ORDER — SODIUM CHLORIDE 0.9 % IV SOLN: 500.0000 mL | Freq: Once | INTRAVENOUS | Status: DC

## 2021-03-13 NOTE — Op Note (Signed)
[facility] Patient Name: Angela Morgan Procedure Date: 03/13/2021 2:38 PM MRN: [id number] Endoscopist: Remo Lipps P. Havery Moros , MD Age: 50 Referring MD:  Date of Birth: 27-Dec-1970 Gender: Female Account #: [id number] Procedure:                Upper GI endoscopy Indications:              Surveillance for malignancy due to personal history                            of Barrett's esophagus (short segment reported                            2017), history of gastro-esophageal reflux disease                            - recently failed protonix, now on aciphex with                            improvement in symptoms / good control Medicines:                Monitored Anesthesia Care Procedure:                Pre-Anesthesia Assessment:                           - Prior to the procedure, a History and Physical                            was performed, and patient medications and                            allergies were reviewed. The patient's tolerance of                            previous anesthesia was also reviewed. The risks                            and benefits of the procedure and the sedation                            options and risks were discussed with the patient.                            All questions were answered, and informed consent                            was obtained. Prior Anticoagulants: The patient has                            taken no previous anticoagulant or antiplatelet                            agents. ASA Grade Assessment: II - A patient with  mild systemic disease. After reviewing the risks                            and benefits, the patient was deemed in                            satisfactory condition to undergo the procedure.                           After obtaining informed consent, the endoscope was                            passed under direct vision. Throughout the                            procedure, the  patient's blood pressure, pulse, and                            oxygen saturations were monitored continuously. The                            Endoscope was introduced through the mouth, and                            advanced to the second part of duodenum. The upper                            GI endoscopy was accomplished without difficulty.                            The patient tolerated the procedure well. Scope In: Scope Out: Findings:                 Esophagogastric landmarks were identified: the                            Z-line was found at 39 cm, the gastroesophageal                            junction was found at 39 cm and the upper extent of                            the gastric folds was found at 40 cm from the                            incisors.                           A 1 cm hiatal hernia was present.                           The Z-line was irregular and was found 39 cm from  the incisors. Biopsies were taken with a cold                            forceps for histology.                           The exam of the esophagus was otherwise normal.                           Patchy mildly erythematous mucosa was found in the                            gastric antrum.                           Multiple small sessile polyps were found in the                            gastric fundus and in the gastric body, suspect                            benign fundic gland polyps. Biopsies were taken                            with a cold forceps for histology from a few                            representative samples.                           The exam of the stomach was otherwise normal.                           Biopsies were taken with a cold forceps in the                            gastric body, at the incisura and in the gastric                            antrum for Helicobacter pylori testing.                           2 areas of benign  lymphangiectasia were present in                            the second portion of the duodenum.                           The exam of the duodenum was otherwise normal. Complications:            No immediate complications. Estimated blood loss:                            Minimal. Estimated Blood Loss:     Estimated blood loss was minimal.  Impression:               - Esophagogastric landmarks identified.                           - 1 cm hiatal hernia.                           - Z-line irregular, 39 cm from the incisors.                            Biopsied.                           - Normal esophagus otherwise                           - Erythematous mucosa in the antrum.                           - Multiple gastric polyps, suspect benign fundic                            gland polyps. Biopsied.                           - Normal stomach otherwise - biopsies taken to rule                            out H pylori                           - Duodenal mucosal lymphangiectasia.                           - Normal duodenum otherwise. Recommendation:           - Patient has a contact number available for                            emergencies. The signs and symptoms of potential                            delayed complications were discussed with the                            patient. Return to normal activities tomorrow.                            Written discharge instructions were provided to the                            patient.                           - Resume previous diet.                           -  Continue present medications.                           - Await pathology results. Remo Lipps P. Havery Moros, MD 03/13/2021 3:23:21 PM This report has been signed electronically.

## 2021-03-13 NOTE — Progress Notes (Signed)
Called to room to assist during endoscopic procedure.  Patient ID and intended procedure confirmed with present staff. Received instructions for my participation in the procedure from the performing physician.  

## 2021-03-13 NOTE — Progress Notes (Signed)
Report given to PACU, vss 

## 2021-03-13 NOTE — Patient Instructions (Signed)
Handout given:  Hiatal Hernia Resume previous diet Continue current medications Repeat colonoscopy in 10 years  YOU HAD AN ENDOSCOPIC PROCEDURE TODAY AT [facility]:   Refer to the procedure report that was given to you for any specific questions about what was found during the examination.  If the procedure report does not answer your questions, please call your gastroenterologist to clarify.  If you requested that your care partner not be given the details of your procedure findings, then the procedure report has been included in a sealed envelope for you to review at your convenience later.  YOU SHOULD EXPECT: Some feelings of bloating in the abdomen. Passage of more gas than usual.  Walking can help get rid of the air that was put into your GI tract during the procedure and reduce the bloating. If you had a lower endoscopy (such as a colonoscopy or flexible sigmoidoscopy) you may notice spotting of blood in your stool or on the toilet paper. If you underwent a bowel prep for your procedure, you may not have a normal bowel movement for a few days.  Please Note:  You might notice some irritation and congestion in your nose or some drainage.  This is from the oxygen used during your procedure.  There is no need for concern and it should clear up in a day or so.  SYMPTOMS TO REPORT IMMEDIATELY:   Following lower endoscopy (colonoscopy or flexible sigmoidoscopy):  Excessive amounts of blood in the stool  Significant tenderness or worsening of abdominal pains  Swelling of the abdomen that is new, acute  Fever of 100F or higher   Following upper endoscopy (EGD)  Vomiting of blood or coffee ground material  New chest pain or pain under the shoulder blades  Painful or persistently difficult swallowing  New shortness of breath  Fever of 100F or higher  Black, tarry-looking stools  For urgent or emergent issues, a gastroenterologist can be reached at any hour by calling [phone]  [phone]. Do not use MyChart messaging for urgent concerns.   DIET:  We do recommend a small meal at first, but then you may proceed to your regular diet.  Drink plenty of fluids but you should avoid alcoholic beverages for 24 hours.  ACTIVITY:  You should plan to take it easy for the rest of today and you should NOT DRIVE or use heavy machinery until tomorrow (because of the sedation medicines used during the test).    FOLLOW UP: Our staff will call the number listed on your records 48-72 hours following your procedure to check on you and address any questions or concerns that you may have regarding the information given to you following your procedure. If we do not reach you, we will leave a message.  We will attempt to reach you two times.  During this call, we will ask if you have developed any symptoms of COVID 19. If you develop any symptoms (ie: fever, flu-like symptoms, shortness of breath, cough etc.) before then, please call [phone].  If you test positive for Covid 19 in the 2 weeks post procedure, please call and report this information to [place].    If any biopsies were taken you will be contacted by phone or by letter within the next 1-3 weeks.  Please call us at [phone] if you have not heard about the biopsies in 3 weeks.   SIGNATURES/CONFIDENTIALITY: You and/or your care partner have signed paperwork which will be entered into your electronic  medical record.  These signatures attest to the fact that that the information above on your After Visit Summary has been reviewed and is understood.  Full responsibility of the confidentiality of this discharge information lies with you and/or your care-partner.

## 2021-03-13 NOTE — Progress Notes (Addendum)
Angela Gutting, RN - VS  Pt can not wear a mask.  It causes her to have dizziness. She has a hx of vestibular neuritis.  Pt has COVID vax x2 and Booster x1.  Recovery Nurse and Dr. Havery Moros and room staff were all notified. Maw

## 2021-03-13 NOTE — Progress Notes (Signed)
1438 Robinul 0.1 mg IV given due large amount of secretions upon assessment.  MD made aware, vss

## 2021-03-13 NOTE — Op Note (Signed)
[facility] Patient Name: Angela Morgan: 03/13/2021 2:37 PM MRN: [id number] Endoscopist: Remo Lipps P. Havery Moros , MD Age: 50 Referring MD:  Morgan of Birth: 09-18-1971 Gender: Female Account #: [id number] Procedure:                Colonoscopy Indications:              High risk colon cancer surveillance: Personal                            history of colonic polyps - patient reports polyps                            removed on last colonoscopy, done several years ago                            (2008?) no report available Medicines:                Monitored Anesthesia Care Procedure:                Pre-Anesthesia Assessment:                           - Prior to the procedure, a History and Physical                            was performed, and patient medications and                            allergies were reviewed. The patient's tolerance of                            previous anesthesia was also reviewed. The risks                            and benefits of the procedure and the sedation                            options and risks were discussed with the patient.                            All questions were answered, and informed consent                            was obtained. Prior Anticoagulants: The patient has                            taken no previous anticoagulant or antiplatelet                            agents. ASA Grade Assessment: II - A patient with                            mild systemic disease. After reviewing the risks  and benefits, the patient was deemed in                            satisfactory condition to undergo the procedure.                           After obtaining informed consent, the colonoscope                            was passed under direct vision. Throughout the                            procedure, the patient's blood pressure, pulse, and                            oxygen saturations were  monitored continuously. The                            Olympus PFC-[id number] (#[id number]) Colonoscope was                            introduced through the anus and advanced to the the                            cecum, identified by appendiceal orifice and                            ileocecal valve. The colonoscopy was performed                            without difficulty. The patient tolerated the                            procedure well. The quality of the bowel                            preparation was good. The ileocecal valve,                            appendiceal orifice, and rectum were photographed. Scope In: 2:55:27 PM Scope Out: 3:12:36 PM Scope Withdrawal Time: 0 hours 14 minutes 54 seconds  Total Procedure Duration: 0 hours 17 minutes 9 seconds  Findings:                 The perianal and digital rectal examinations were                            normal.                           The entire examined colon appeared normal on direct                            and retroflexion views. No polyps. Complications:            No  immediate complications. Estimated blood loss:                            None. Estimated Blood Loss:     Estimated blood loss: none. Impression:               - The entire examined colon is normal on direct and                            retroflexion views.                           - No polyps Recommendation:           - Patient has a contact number available for                            emergencies. The signs and symptoms of potential                            delayed complications were discussed with the                            patient. Return to normal activities tomorrow.                            Written discharge instructions were provided to the                            patient.                           - Resume previous diet.                           - Continue present medications.                           - Repeat colonoscopy in 10  years for screening                            purposes. Remo Lipps P. Keandrea Tapley, MD 03/13/2021 3:16:44 PM This report has been signed electronically.

## 2021-03-17 ENCOUNTER — Telehealth: Payer: Self-pay | Admitting: *Deleted

## 2021-03-17 NOTE — Telephone Encounter (Signed)
  Follow up Call-  Call back number 03/13/2021  Post procedure Call Back phone  # [phone] cell  Permission to leave phone message Yes  Some recent data might be hidden     Patient questions:  Do you have a fever, pain , or abdominal swelling? No. Pain Score  0 *  Have you tolerated food without any problems? Yes.    Have you been able to return to your normal activities? Yes.    Do you have any questions about your discharge instructions: Diet   No. Medications  No. Follow up visit  No.  Do you have questions or concerns about your Care? No.  Actions: * If pain score is 4 or above: No action needed, pain <4.  1. Have you developed a fever since your procedure? no  2.   Have you had an respiratory symptoms (SOB or cough) since your procedure? no  3.   Have you tested positive for COVID 19 since your procedure no  4.   Have you had any family members/close contacts diagnosed with the COVID 19 since your procedure?  no   If yes to any of these questions please route to Joylene John, RN and Joella Prince, RN

## 2021-03-17 NOTE — Telephone Encounter (Signed)
Attempted f/u phone call. No answer. Left message. °

## 2021-04-15 NOTE — Progress Notes (Deleted)
PCP:  Harrison Mons, PA   No chief complaint on file.    HPI:      [name] is a 50 y.o. [phone] who LMP was No LMP recorded. Patient has had a hysterectomy., presents today for her annual examination.  Her menses are absent due to hyst. She does not have intermenstrual bleeding. She has had increased vasomotor sx the past 2 months. Prior to this, her sx were fairly well-controlled with estradiol 1 mg daily and prog 150 mg SR caps, 6 nights on, 1 night off. Pt has been under increased stress past 3 wks particularly with a sick brother. Wt gain, hair loss improved from last yr. She was on wellbutrin last yr and was able to go off, but had to restart with recent stress.   Sex activity: single partner, contraception - post menopausal status. She uses estradiol/testosterone vag supp 1-2 times weekly for vag dryness sx with sx relief. She is also using lubricants.  Last Pap: December 20, 2016  Results were: no abnormalities /neg HPV DNA . No longer indicated Hx of STDs: none  Last mammogram: 01/26/21 at [facility];  Results were: normal--routine follow-up in 12 months There is no FH of breast cancer. There is a FH of ovarian cancer in her mat grt aunt, genetic testing not indicated. The patient does do self-breast exams.  Tobacco use: The patient denies current or previous tobacco use. Alcohol use: none No drug use.  Exercise: very active  She does get adequate calcium and Vitamin D in her diet.  Labs with PCP.   Past Medical History:  Diagnosis Date  . Allergic rhinitis, cause unspecified   . Allergy   . Anxiety   . Arthritis    right knee  . Depression   . Depressive disorder, not elsewhere classified   . GERD (gastroesophageal reflux disease)    with esophagitis  . Insomnia, unspecified   . Irritable bowel syndrome   . Migraine, unspecified, without mention of intractable migraine without mention of status migrainosus   . Pilonidal cyst without abscess 03/28/2013   . Postablative ovarian failure   . Symptomatic states associated with artificial menopause   . Vestibular dysfunction   . Vestibular neuritis     Past Surgical History:  Procedure Laterality Date  . APPENDECTOMY    . BILATERAL SALPINGOOPHORECTOMY    . BREAST BIOPSY Left 2015 x2   NEG  . CESAREAN SECTION     x 2  . CHOLECYSTECTOMY    . ESOPHAGOGASTRODUODENOSCOPY (EGD) WITH PROPOFOL N/A 01/12/2016   Procedure: ESOPHAGOGASTRODUODENOSCOPY (EGD) WITH PROPOFOL;  Surgeon: Hulen Luster, MD;  Location: [facility] ENDOSCOPY;  Service: Gastroenterology;  Laterality: N/A;  . KNEE SURGERY     right  . Laparoscopy  1992/1993, 1996/1997   Endometriosis  . TEMPOROMANDIBULAR JOINT SURGERY    . TOTAL ABDOMINAL HYSTERECTOMY     ovaries removed  . UPPER GASTROINTESTINAL ENDOSCOPY      Family History  Problem Relation Age of Onset  . Diabetes Maternal Grandfather   . Hypertension Mother   . Stroke Mother   . COPD Mother   . Aneurysm Mother        brain  . Transient ischemic attack Mother        multiple  . Hyperlipidemia Mother   . Hypertension Father   . Heart disease Father   . Mental illness Father   . Depression Father   . Hyperlipidemia Father   . Hyperlipidemia Brother   .  Hypertension Brother   . Hypertension Brother   . Diabetes Brother   . Asthma Son   . Colon cancer Neg Hx   . Esophageal cancer Neg Hx   . Rectal cancer Neg Hx   . Stomach cancer Neg Hx     Social History   Socioeconomic History  . Marital status: Married    Spouse name: Randall Hiss  . Number of children: 2  . Years of education: college  . Highest education level: Not on file  Occupational History  . Occupation: Physicist, medical    Comment: Acupuncturist  Tobacco Use  . Smoking status: Never Smoker  . Smokeless tobacco: Never Used  Vaping Use  . Vaping Use: Never used  Substance and Sexual Activity  . Alcohol use: Yes    Alcohol/week: 4.0 standard drinks    Types: 4 Glasses of wine per  week    Comment: occasional  . Drug use: No  . Sexual activity: Yes    Birth control/protection: Post-menopausal, Surgical    Comment: hysterectomy  Other Topics Concern  . Not on file  Social History Narrative   Marital Status:  Remarried in 03/2015.  Second marriage.        Children:  2 children (23 [name], Imbery); no grandchildren.       Living:  living in apartment with husband in [place].  [name] will be going to [employer] in 2018-2019. Joint custody of Ian/stepson.    [name] is in [place].        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      Exercise: exercising sporadically in 2018; yoga and gym once per week.        Always uses seat belts / helmet.       Smoke alarm in the home.       No guns in the home.     Caffeine use: moderate      Education: Pensions consultant Strain: Not on file  Food Insecurity: Not on file  Transportation Needs: Not on file  Physical Activity: Not on file  Stress: Not on file  Social Connections: Not on file  Intimate Partner Violence: Not on file    No outpatient medications have been marked as taking for the 04/16/21 encounter (Appointment) with Hayven Fatima, Deirdre Evener, PA-C.     ROS:  Review of Systems  Constitutional: Negative for fatigue, fever and unexpected weight change.  Respiratory: Negative for cough, shortness of breath and wheezing.   Cardiovascular: Negative for chest pain, palpitations and leg swelling.  Gastrointestinal: Negative for blood in stool, constipation, diarrhea, nausea and vomiting.  Endocrine: Negative for cold intolerance, heat intolerance and polyuria.  Genitourinary: Negative for dyspareunia, dysuria, flank pain, frequency, genital sores, hematuria, menstrual problem, pelvic pain, urgency, vaginal bleeding, vaginal discharge and vaginal pain.  Musculoskeletal: Negative for arthralgias, back pain,  joint swelling and myalgias.  Skin: Negative for rash.  Neurological: Negative for dizziness, syncope, light-headedness, numbness and headaches.  Hematological: Negative for adenopathy.  Psychiatric/Behavioral: Positive for agitation and dysphoric mood. Negative for confusion, sleep disturbance and suicidal ideas. The patient is not nervous/anxious.      Objective: There were no vitals taken for this visit.   Physical Exam Constitutional:  Appearance: She is well-developed.  Genitourinary:     Vulva normal.     Genitourinary Comments: UTERUS/CX SURG REM     No vaginal discharge, erythema or tenderness.      Right Adnexa: absent.    Right Adnexa: not tender and no mass present.    Left Adnexa: absent.    Left Adnexa: not tender and no mass present.    Cervix is absent.     Uterus is not tender.     Uterus is absent.  Breasts:     Right: No mass, nipple discharge, skin change or tenderness.     Left: No mass, nipple discharge, skin change or tenderness.    Neck:     Thyroid: No thyromegaly.  Cardiovascular:     Rate and Rhythm: Normal rate and regular rhythm.     Heart sounds: Normal heart sounds. No murmur heard.   Pulmonary:     Effort: Pulmonary effort is normal.     Breath sounds: Normal breath sounds.  Abdominal:     Palpations: Abdomen is soft.     Tenderness: There is no abdominal tenderness. There is no guarding.  Musculoskeletal:        General: Normal range of motion.     Cervical back: Normal range of motion.  Neurological:     General: No focal deficit present.     Mental Status: She is alert and oriented to person, place, and time.     Cranial Nerves: No cranial nerve deficit.  Skin:    General: Skin is warm and dry.  Psychiatric:        Mood and Affect: Mood normal.        Behavior: Behavior normal.        Thought Content: Thought content normal.        Judgment: Judgment normal.  Vitals reviewed.     Assessment/Plan: Encounter for  annual routine gynecological examination  Encounter for screening mammogram for malignant neoplasm of breast; pt current on mammo  Hormone replacement therapy (HRT) - Rx RF. Estradiol to [facility]. Vag supp and prog sent to [facility] for compouding. - Plan: AMBULATORY NON FORMULARY MEDICATION, AMBULATORY NON FORMULARY MEDICATION, estradiol (ESTRACE) 1 MG tablet  Vasomotor symptoms due to menopause - Sx increase most likely due to increased stress.  Sx fairly controlled with current dose before stress. F/u prn sx. May need to increase prog to 200 mg QHS.  F/u prn.  - Plan: AMBULATORY NON FORMULARY MEDICATION, estradiol (ESTRACE) 1 MG tablet  Postmenopausal atrophic vaginitis - Vag tissue well-estrogenated. Cont HRT vag supp, 1-2 times weekly. Rx sent to [facility]. - Plan: AMBULATORY NON FORMULARY MEDICATION  No orders of the defined types were placed in this encounter.            GYN counsel breast self exam, mammography screening, menopause, adequate intake of calcium and vitamin D, diet and exercise     F/U  No follow-ups on file.  Keilani Terrance B. Ellee Wawrzyniak, PA-C 04/15/2021 4:44 PM

## 2021-04-16 ENCOUNTER — Ambulatory Visit: Payer: 59 | Admitting: Obstetrics and Gynecology

## 2021-04-16 ENCOUNTER — Encounter: Payer: Self-pay | Admitting: Obstetrics and Gynecology

## 2021-04-16 DIAGNOSIS — N951 Menopausal and female climacteric states: Secondary | ICD-10-CM

## 2021-04-16 DIAGNOSIS — Z1231 Encounter for screening mammogram for malignant neoplasm of breast: Secondary | ICD-10-CM

## 2021-04-16 DIAGNOSIS — N952 Postmenopausal atrophic vaginitis: Secondary | ICD-10-CM

## 2021-04-16 DIAGNOSIS — Z79890 Hormone replacement therapy (postmenopausal): Secondary | ICD-10-CM

## 2021-04-16 DIAGNOSIS — Z01419 Encounter for gynecological examination (general) (routine) without abnormal findings: Secondary | ICD-10-CM

## 2021-04-29 NOTE — Progress Notes (Signed)
PCP:  Harrison Mons, PA   Chief Complaint  Patient presents with  . Gynecologic Exam     HPI:      Angela Morgan is a 50 y.o. [phone] who LMP was No LMP recorded. Patient has had a hysterectomy., presents today for her annual examination.  Her menses are absent due to hyst. She does not have PMB. She had been on estradiol 1 mg daily and prog 150 mg SR caps, 6 nights on, 1 night off for a few yrs for vasomotor sx. She went to [facility] for HRT earlier this yr and had estrogen and testosterone pellets placed, kept taking oral prog. Has noticed wt gain with this and wants to go back to original Rxs.  Sex activity: single partner, contraception - post menopausal status. She was using estradiol/testosterone vag supp 1-2 times weekly for vag dryness sx with sx relief, but stopped these with [facility]. Would like to restart. She is also using lubricants.  Last Pap: December 20, 2016  Results were: no abnormalities /neg HPV DNA . No longer indicated Hx of STDs: none  Last mammogram: 01/26/21 at [facility];  Results were: normal--routine follow-up in 12 months There is no FH of breast cancer. There is a FH of ovarian cancer in her mat grt aunt, genetic testing not indicated for pt. Angela patient does do self-breast exams.  Has had issued with urinary odor with urgency and frequency at times for past 3 months. No hematuria, dysuria, LBP, pelvic pain, fevers. Drinks caffeine  Tobacco use: Angela patient denies current or previous tobacco use. Alcohol use: none No drug use.  Exercise: very active  Colonoscopy:  3/21 without abnormalities, pt unsure when due again. Has yearly EGD due to Barrett's esophagus. Sees GI at [facility]  She does get adequate calcium and Vitamin D in her diet.  Labs with PCP.   Past Medical History:  Diagnosis Date  . Allergic rhinitis, cause unspecified   . Allergy   . Anxiety   . Arthritis    right knee  . Depression   . Depressive disorder, not elsewhere classified    . GERD (gastroesophageal reflux disease)    with esophagitis  . Insomnia, unspecified   . Irritable bowel syndrome   . Migraine, unspecified, without mention of intractable migraine without mention of status migrainosus   . Pilonidal cyst without abscess 03/28/2013  . Postablative ovarian failure   . Symptomatic states associated with artificial menopause   . Vestibular dysfunction   . Vestibular neuritis     Past Surgical History:  Procedure Laterality Date  . APPENDECTOMY    . BILATERAL SALPINGOOPHORECTOMY    . BREAST BIOPSY Left 2015 x2   NEG  . CESAREAN SECTION     x 2  . CHOLECYSTECTOMY    . ESOPHAGOGASTRODUODENOSCOPY (EGD) WITH PROPOFOL N/A 01/12/2016   Procedure: ESOPHAGOGASTRODUODENOSCOPY (EGD) WITH PROPOFOL;  Surgeon: Hulen Luster, MD;  Location: [facility] ENDOSCOPY;  Service: Gastroenterology;  Laterality: N/A;  . KNEE SURGERY     right  . Laparoscopy  1992/1993, 1996/1997   Endometriosis  . TEMPOROMANDIBULAR JOINT SURGERY    . TOTAL ABDOMINAL HYSTERECTOMY     ovaries removed  . UPPER GASTROINTESTINAL ENDOSCOPY      Family History  Problem Relation Age of Onset  . Diabetes Maternal Grandfather   . Hypertension Mother   . Stroke Mother   . COPD Mother   . Aneurysm Mother        brain  .  Transient ischemic attack Mother        multiple  . Hyperlipidemia Mother   . Hypertension Father   . Heart disease Father   . Mental illness Father   . Depression Father   . Hyperlipidemia Father   . Hyperlipidemia Brother   . Hypertension Brother   . Hypertension Brother   . Diabetes Brother   . Asthma Son   . Colon cancer Neg Hx   . Esophageal cancer Neg Hx   . Rectal cancer Neg Hx   . Stomach cancer Neg Hx     Social History   Socioeconomic History  . Marital status: Married    Spouse name: Angela Morgan  . Number of children: 2  . Years of education: college  . Highest education level: Not on file  Occupational History  . Occupation: Physicist, medical    Comment:  Acupuncturist  Tobacco Use  . Smoking status: Never Smoker  . Smokeless tobacco: Never Used  Vaping Use  . Vaping Use: Never used  Substance and Sexual Activity  . Alcohol use: Yes    Alcohol/week: 4.0 standard drinks    Types: 4 Glasses of wine per week    Comment: occasional  . Drug use: No  . Sexual activity: Yes    Birth control/protection: Post-menopausal, Surgical    Comment: hysterectomy  Other Topics Concern  . Not on file  Social History Narrative   Marital Status:  Remarried in 03/2015.  Second marriage.        Children:  2 children (23 Angela Morgan, Angela Morgan); no grandchildren.       Living:  living in apartment with husband in [place].  Angela Morgan will be going to [employer] in 2018-2019. Joint custody of Angela Morgan/stepson.    Angela Morgan is in [place].        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      Exercise: exercising sporadically in 2018; yoga and gym once per week.        Always uses seat belts / helmet.       Smoke alarm in Angela home.       No guns in Angela home.     Caffeine use: moderate      Education: Pensions consultant Strain: Not on file  Food Insecurity: Not on file  Transportation Needs: Not on file  Physical Activity: Not on file  Stress: Not on file  Social Connections: Not on file  Intimate Partner Violence: Not on file    Current Meds  Medication Sig  . azelastine (ASTELIN) 0.1 % nasal spray   . buPROPion (WELLBUTRIN XL) 150 MG 24 hr tablet Take 1 tablet (150 mg total) by mouth daily.  . Cholecalciferol (VITAMIN D-1000 MAX ST) 25 MCG (1000 UT) tablet Take by mouth.  . eletriptan (RELPAX) 40 MG tablet Take 1 tablet by mouth daily as needed.  Marland Kitchen Galcanezumab-gnlm (EMGALITY) 120 MG/ML SOAJ Inject into Angela skin as directed.  Marland Kitchen ibuprofen (ADVIL,MOTRIN) 600 MG tablet Take 1 tablet (600 mg total) by mouth every 6 (six) hours as needed.  .  loratadine (CLARITIN) 10 MG tablet Take 10 mg by mouth daily.  . Multiple Vitamin (MULTIVITAMIN) tablet Take 1 tablet by mouth daily.  . ondansetron (ZOFRAN) 4 MG tablet  Take by mouth.  . RABEprazole (ACIPHEX) 20 MG tablet Take 1 tablet (20 mg total) by mouth daily.  . Rimegepant Sulfate (NURTEC) 75 MG TBDP Take by mouth.  . [DISCONTINUED] AMBULATORY NON FORMULARY MEDICATION Progesterone 150 mg SR caps, Take 1 capsule nightly, 6 nights on, 1 night off     ROS:  Review of Systems  Constitutional: Negative for fatigue, fever and unexpected weight change.  Respiratory: Negative for cough, shortness of breath and wheezing.   Cardiovascular: Negative for chest pain, palpitations and leg swelling.  Gastrointestinal: Negative for blood in stool, constipation, diarrhea, nausea and vomiting.  Endocrine: Negative for cold intolerance, heat intolerance and polyuria.  Genitourinary: Positive for frequency and urgency. Negative for dyspareunia, dysuria, flank pain, genital sores, hematuria, menstrual problem, pelvic pain, vaginal bleeding, vaginal discharge and vaginal pain.  Musculoskeletal: Negative for arthralgias, back pain, joint swelling and myalgias.  Skin: Negative for rash.  Neurological: Negative for dizziness, syncope, light-headedness, numbness and headaches.  Hematological: Negative for adenopathy.  Psychiatric/Behavioral: Positive for dysphoric mood. Negative for agitation, confusion, sleep disturbance and suicidal ideas. Angela patient is not nervous/anxious.      Objective: BP 128/82   Pulse 61   Temp 98.7 F (37.1 C)   Ht 5\' 4"  (1.626 m)   Wt 166 lb (75.3 kg)   SpO2 98%   BMI 28.49 kg/m    Physical Exam Constitutional:      Appearance: She is well-developed.  Genitourinary:     Vulva normal.     Genitourinary Comments: UTERUS/CX SURG REM     No vaginal discharge, erythema or tenderness.      Right Adnexa: absent.    Right Adnexa: not tender and no mass present.     Left Adnexa: absent.    Left Adnexa: not tender and no mass present.    Cervix is absent.     Uterus is not tender.     Uterus is absent.  Breasts:     Right: No mass, nipple discharge, skin change or tenderness.     Left: No mass, nipple discharge, skin change or tenderness.    Neck:     Thyroid: No thyromegaly.  Cardiovascular:     Rate and Rhythm: Normal rate and regular rhythm.     Heart sounds: Normal heart sounds. No murmur heard.   Pulmonary:     Effort: Pulmonary effort is normal.     Breath sounds: Normal breath sounds.  Abdominal:     Palpations: Abdomen is soft.     Tenderness: There is no abdominal tenderness. There is no guarding.  Musculoskeletal:        General: Normal range of motion.     Cervical back: Normal range of motion.  Neurological:     General: No focal deficit present.     Mental Status: She is alert and oriented to person, place, and time.     Cranial Nerves: No cranial nerve deficit.  Skin:    General: Skin is warm and dry.  Psychiatric:        Mood and Affect: Mood normal.        Behavior: Behavior normal.        Thought Content: Thought content normal.        Judgment: Judgment normal.  Vitals reviewed.    UA neg, spec gravity 1.025, pH=8.0.   Assessment/Plan: Encounter for annual routine gynecological examination  Encounter for screening mammogram for malignant neoplasm of breast; pt current on mammo  Vasomotor symptoms  due to menopause - Plan: AMBULATORY NON FORMULARY MEDICATION, estradiol (ESTRACE) 1 MG tablet  Hormone replacement therapy (HRT) - Plan: AMBULATORY NON FORMULARY MEDICATION, AMBULATORY NON FORMULARY MEDICATION, estradiol (ESTRACE) 1 MG tablet; Rx restart estradiol, sent to [facility] and Rx prog to [facility]. F/u prn.  Postmenopausal atrophic vaginitis - Plan: AMBULATORY NON FORMULARY MEDICATION; Rx RF estriol/testosterone supp to [facility]. F/u prn.  Abnormal urine odor - Plan: POCT Urinalysis  Dipstick, Urine Culture; neg UA, check C&S. If neg, d/c caffeine. F/u prn.    Meds ordered this encounter  Medications  . AMBULATORY NON FORMULARY MEDICATION    Sig: Progesterone 150 mg SR caps, Take 1 capsule nightly, 6 nights on, 1 night off    Dispense:  90 capsule    Refill:  3    Order Specific Question:   Supervising Provider    Answer:   Gae Dry [id number]  . AMBULATORY NON FORMULARY MEDICATION    Sig: Estriol-testosterone ovules 1mg /1mg  Insert 1 ovule vaginally 1-2 times weekly prn sx    Dispense:  30 Units    Refill:  3    Order Specific Question:   Supervising Provider    Answer:   Gae Dry [id number]  . estradiol (ESTRACE) 1 MG tablet    Sig: Take 1 tablet (1 mg total) by mouth daily.    Dispense:  90 tablet    Refill:  3    Order Specific Question:   Supervising Provider    Answer:   Gae Dry [[id number]]             GYN counsel breast self exam, mammography screening, menopause, adequate intake of calcium and vitamin D, diet and exercise     F/U  Return in about 1 year (around 04/30/2022).  Oney Tatlock B. Delanna Blacketer, PA-C 05/01/2021 11:30 AM

## 2021-04-30 ENCOUNTER — Other Ambulatory Visit: Payer: Self-pay

## 2021-04-30 ENCOUNTER — Encounter: Payer: Self-pay | Admitting: Obstetrics and Gynecology

## 2021-04-30 ENCOUNTER — Ambulatory Visit (INDEPENDENT_AMBULATORY_CARE_PROVIDER_SITE_OTHER): Payer: 59 | Admitting: Obstetrics and Gynecology

## 2021-04-30 VITALS — BP 128/82 | HR 61 | Temp 98.7°F | Ht 64.0 in | Wt 166.0 lb

## 2021-04-30 DIAGNOSIS — R8290 Unspecified abnormal findings in urine: Secondary | ICD-10-CM

## 2021-04-30 DIAGNOSIS — Z1211 Encounter for screening for malignant neoplasm of colon: Secondary | ICD-10-CM

## 2021-04-30 DIAGNOSIS — Z79890 Hormone replacement therapy (postmenopausal): Secondary | ICD-10-CM

## 2021-04-30 DIAGNOSIS — Z01419 Encounter for gynecological examination (general) (routine) without abnormal findings: Secondary | ICD-10-CM | POA: Diagnosis not present

## 2021-04-30 DIAGNOSIS — N951 Menopausal and female climacteric states: Secondary | ICD-10-CM | POA: Insufficient documentation

## 2021-04-30 DIAGNOSIS — N952 Postmenopausal atrophic vaginitis: Secondary | ICD-10-CM

## 2021-04-30 DIAGNOSIS — Z1231 Encounter for screening mammogram for malignant neoplasm of breast: Secondary | ICD-10-CM | POA: Diagnosis not present

## 2021-04-30 LAB — POCT URINALYSIS DIPSTICK
Bilirubin, UA: NEGATIVE
Glucose, UA: NEGATIVE
Ketones, UA: NEGATIVE
Nitrite, UA: NEGATIVE
Protein, UA: NEGATIVE
Spec Grav, UA: 1.01 (ref 1.010–1.025)
Urobilinogen, UA: 0.2 E.U./dL
pH, UA: 7.5 (ref 5.0–8.0)

## 2021-04-30 MED ORDER — AMBULATORY NON FORMULARY MEDICATION: 3 refills | Status: DC

## 2021-04-30 MED ORDER — ESTRADIOL 1 MG PO TABS: 1.0000 mg | ORAL_TABLET | Freq: Every day | ORAL | 3 refills | Status: DC

## 2021-04-30 NOTE — Patient Instructions (Signed)
I value your feedback and you entrusting us with your care. If you get a [facility] patient survey, I would appreciate you taking the time to let us know about your experience today. Thank you! ? ? ?

## 2021-05-08 LAB — URINE CULTURE

## 2021-05-08 MED ORDER — NITROFURANTOIN MONOHYD MACRO 100 MG PO CAPS: 100.0000 mg | ORAL_CAPSULE | Freq: Two times a day (BID) | ORAL | 0 refills | Status: AC

## 2021-05-08 NOTE — Addendum Note (Signed)
Addended by: Ardeth Perfect B on: 04/08/6643 08:08 AM   Modules accepted: Orders

## 2022-02-18 ENCOUNTER — Telehealth: Payer: Self-pay

## 2022-02-18 NOTE — Telephone Encounter (Signed)
Pt needs a refill on her Progesterone sent to [facility], states the refill request was denied  ?

## 2022-02-18 NOTE — Telephone Encounter (Signed)
1 yr RF sent 5/22 at annual. Pls check with pharm why Rx running out (can give 1 more RF if no more). Pt needs to [facility] annual for 5/23.

## 2022-02-19 NOTE — Telephone Encounter (Signed)
Yes please send in more refill and pt will schedule annual.

## 2022-02-21 NOTE — Telephone Encounter (Signed)
Pls call [facility] to discuss Rx. Doesn't look like Janett Billow spoke with them.

## 2022-02-22 NOTE — Telephone Encounter (Signed)
90 day supply verbal taken by Katharine Look. She will call pt to let her know. ?

## 2022-02-22 NOTE — Telephone Encounter (Signed)
Pls call in #90. What they're saying makes no sense bc Rx clearly says #90 and if it was #30, she would have been calling in Sept!

## 2022-03-18 ENCOUNTER — Encounter: Payer: Self-pay | Admitting: Obstetrics and Gynecology

## 2022-03-18 ENCOUNTER — Telehealth: Payer: Self-pay | Admitting: Obstetrics and Gynecology

## 2022-03-18 NOTE — Telephone Encounter (Signed)
Called pt to schedule her annual appt.  Her last annual was 04/30/21.  Left message for pt to call back.   ?

## 2022-03-18 NOTE — Telephone Encounter (Signed)
Called pt to schedule annual.  Had to leave a messge.   ?

## 2022-04-15 ENCOUNTER — Telehealth: Payer: Self-pay

## 2022-04-15 NOTE — Telephone Encounter (Signed)
Called Angela Morgan back left voicemail for her to call us back. ? ?Note: ask her what medications needs to be refilled. ?

## 2022-04-15 NOTE — Telephone Encounter (Signed)
Angela Morgan called triage line, she was scheduled to come see Elmo Putt on May 82UM for her yearly ?She received a call yesterday that she would be having shoulder surgery on May 25th. She's already called front office to reschedule her appointment but will need some medications refilled to hold her over until her yearly which is now scheduled 07/01/22. ?

## 2022-04-16 NOTE — Telephone Encounter (Signed)
RF's on estradiol, progesterone, and testosterone pls. ?

## 2022-04-18 ENCOUNTER — Other Ambulatory Visit: Payer: Self-pay | Admitting: Obstetrics and Gynecology

## 2022-04-18 DIAGNOSIS — Z79890 Hormone replacement therapy (postmenopausal): Secondary | ICD-10-CM

## 2022-04-18 DIAGNOSIS — N951 Menopausal and female climacteric states: Secondary | ICD-10-CM

## 2022-04-18 DIAGNOSIS — N952 Postmenopausal atrophic vaginitis: Secondary | ICD-10-CM

## 2022-04-20 ENCOUNTER — Other Ambulatory Visit: Payer: Self-pay | Admitting: Obstetrics and Gynecology

## 2022-04-20 DIAGNOSIS — Z79890 Hormone replacement therapy (postmenopausal): Secondary | ICD-10-CM

## 2022-04-20 DIAGNOSIS — N951 Menopausal and female climacteric states: Secondary | ICD-10-CM

## 2022-04-20 DIAGNOSIS — N952 Postmenopausal atrophic vaginitis: Secondary | ICD-10-CM

## 2022-04-20 MED ORDER — AMBULATORY NON FORMULARY MEDICATION: 0 refills | Status: DC

## 2022-04-20 MED ORDER — ESTRADIOL 1 MG PO TABS: 1.0000 mg | ORAL_TABLET | Freq: Every day | ORAL | 0 refills | Status: DC

## 2022-04-20 NOTE — Telephone Encounter (Signed)
Rx estradiol to [facility], prog and testosterone ovules faxed to [facility]. Pls notify pt.

## 2022-04-20 NOTE — Progress Notes (Signed)
Rx RF estradiol to [facility], estriol-testosterone ovules and prog to [facility] till annual.  ?

## 2022-04-20 NOTE — Telephone Encounter (Signed)
Calle pt, no answer, left detailed msg Rx's sent to pharmacies. ?

## 2022-04-29 ENCOUNTER — Telehealth: Payer: Self-pay | Admitting: Gastroenterology

## 2022-04-29 MED ORDER — DICYCLOMINE HCL 20 MG PO TABS: 20.0000 mg | ORAL_TABLET | Freq: Two times a day (BID) | ORAL | 0 refills | Status: AC | PRN

## 2022-04-29 MED ORDER — RABEPRAZOLE SODIUM 20 MG PO TBEC: 20.0000 mg | DELAYED_RELEASE_TABLET | Freq: Every day | ORAL | 3 refills | Status: AC

## 2022-04-29 NOTE — Telephone Encounter (Signed)
Patient scheduled an appt with Janett Billow 6/16 at 11 a.m. because her GERD and IBS is acting up.  She asked if there was something that could be called in for her to take in the interim as she is out of her medications.  She uses [facility] on [address]  Please call patient and advise.

## 2022-04-29 NOTE — Telephone Encounter (Signed)
Returned call to patient. I informed her that we have sent in the refills for her medications. Pt advised to keep follow up with [facility] as scheduled. Pt verbalized understanding and had no concerns at the end of the call.

## 2022-04-29 NOTE — Telephone Encounter (Signed)
Yes, okay to refill.  Thanks! 

## 2022-04-29 NOTE — Telephone Encounter (Signed)
Returned call to patient. She states that her symptoms of GERD and IBS has been well controlled. Pt states that she was doing well and working out seemed to settle her symptoms. Pt reports that she had to have should surgery in October and her symptoms returned. She has not been working out anymore due to her shoulder surgery. Pt reports that she has been off of Aciphex and Dicyclomine for about 4 months. Pt reports that she is under a lot of stress right now. She is eating smaller meals. She reports that she had so much bloating that she looked like she was about 8 months pregnant. Pt reports that she has bloating, bowel habits alternate between constipation and diarrhea, and gas. Pt states that her last BM was about 2 weeks ago. Pt states that her symptoms are well controlled on Aciphex and Dicyclomine. She has an upcoming appt with Alonza Bogus, PA-C on Friday, 05/21/22 at 11 am. OK to refill Aciphex and Dicyclomine? Thanks

## 2022-04-29 NOTE — Telephone Encounter (Signed)
This is a Dr Havery Moros pt will forward to [place].

## 2022-05-04 ENCOUNTER — Ambulatory Visit: Payer: 59 | Admitting: Obstetrics and Gynecology

## 2022-05-21 ENCOUNTER — Ambulatory Visit: Payer: 59 | Admitting: Gastroenterology

## 2022-07-01 ENCOUNTER — Ambulatory Visit: Payer: 59 | Admitting: Obstetrics and Gynecology

## 2022-08-15 NOTE — Progress Notes (Unsigned)
PCP:  Harrison Mons, PA   No chief complaint on file.    HPI:      [name] is a 51 y.o. [phone] who LMP was No LMP recorded. Patient has had a hysterectomy., presents today for her annual examination.  Her menses are absent due to hyst. She does not have PMB. She had been on estradiol 1 mg daily and prog 150 mg SR caps, 6 nights on, 1 night off for a few yrs for vasomotor sx. She went to [facility] for HRT earlier this yr and had estrogen and testosterone pellets placed, kept taking oral prog. Has noticed wt gain with this and wants to go back to original Rxs.  Sex activity: single partner, contraception - post menopausal status. She was using estradiol/testosterone vag supp 1-2 times weekly for vag dryness sx with sx relief, but stopped these with [facility]. Would like to restart. She is also using lubricants.  Last Pap: December 20, 2016  Results were: no abnormalities /neg HPV DNA . No longer indicated Hx of STDs: none  Last mammogram: 01/26/21 at [facility];  Results were: normal--routine follow-up in 12 months There is no FH of breast cancer. There is a FH of ovarian cancer in her mat grt aunt, genetic testing not indicated for pt. The patient does do self-breast exams.  Has had issued with urinary odor with urgency and frequency at times for past 3 months. No hematuria, dysuria, LBP, pelvic pain, fevers. Drinks caffeine  Tobacco use: The patient denies current or previous tobacco use. Alcohol use: none No drug use.  Exercise: very active  Colonoscopy:  3/21 without abnormalities, pt unsure when due again. Has yearly EGD due to Barrett's esophagus. Sees GI at [facility]  She does get adequate calcium and Vitamin D in her diet.  Labs with PCP.   Past Medical History:  Diagnosis Date   Allergic rhinitis, cause unspecified    Allergy    Anxiety    Arthritis    right knee   Depression    Depressive disorder, not elsewhere classified    GERD (gastroesophageal reflux  disease)    with esophagitis   Insomnia, unspecified    Irritable bowel syndrome    Migraine, unspecified, without mention of intractable migraine without mention of status migrainosus    Pilonidal cyst without abscess 03/28/2013   Postablative ovarian failure    Symptomatic states associated with artificial menopause    Vestibular dysfunction    Vestibular neuritis     Past Surgical History:  Procedure Laterality Date   APPENDECTOMY     BILATERAL SALPINGOOPHORECTOMY     BREAST BIOPSY Left 2015 x2   NEG   CESAREAN SECTION     x 2   CHOLECYSTECTOMY     ESOPHAGOGASTRODUODENOSCOPY (EGD) WITH PROPOFOL N/A 01/12/2016   Procedure: ESOPHAGOGASTRODUODENOSCOPY (EGD) WITH PROPOFOL;  Surgeon: Hulen Luster, MD;  Location: [facility] ENDOSCOPY;  Service: Gastroenterology;  Laterality: N/A;   KNEE SURGERY     right   Laparoscopy  1992/1993, 1996/1997   Endometriosis   TEMPOROMANDIBULAR JOINT SURGERY     TOTAL ABDOMINAL HYSTERECTOMY     ovaries removed   UPPER GASTROINTESTINAL ENDOSCOPY      Family History  Problem Relation Age of Onset   Diabetes Maternal Grandfather    Hypertension Mother    Stroke Mother    COPD Mother    Aneurysm Mother        brain   Transient ischemic attack Mother  multiple   Hyperlipidemia Mother    Hypertension Father    Heart disease Father    Mental illness Father    Depression Father    Hyperlipidemia Father    Hyperlipidemia Brother    Hypertension Brother    Hypertension Brother    Diabetes Brother    Asthma Son    Colon cancer Neg Hx    Esophageal cancer Neg Hx    Rectal cancer Neg Hx    Stomach cancer Neg Hx     Social History   Socioeconomic History   Marital status: Married    Spouse name: Randall Hiss   Number of children: 2   Years of education: college   Highest education level: Not on file  Occupational History   Occupation: Physicist, medical    Comment: Acupuncturist  Tobacco Use   Smoking status: Never   Smokeless  tobacco: Never  Vaping Use   Vaping Use: Never used  Substance and Sexual Activity   Alcohol use: Yes    Alcohol/week: 4.0 standard drinks of alcohol    Types: 4 Glasses of wine per week    Comment: occasional   Drug use: No   Sexual activity: Yes    Birth control/protection: Post-menopausal, Surgical    Comment: hysterectomy  Other Topics Concern   Not on file  Social History Narrative   Marital Status:  Remarried in 03/2015.  Second marriage.        Children:  2 children (23 [name], [place]); no grandchildren.       Living:  living in apartment with husband in [place].  [name] will be going to [employer] in 2018-2019. Joint custody of Ian/stepson.    [name] is in [place].        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      Exercise: exercising sporadically in 2018; yoga and gym once per week.        Always uses seat belts / helmet.       Smoke alarm in the home.       No guns in the home.     Caffeine use: moderate      Education: Pensions consultant Strain: Not on file  Food Insecurity: Not on file  Transportation Needs: Not on file  Physical Activity: Not on file  Stress: Not on file  Social Connections: Not on file  Intimate Partner Violence: Not on file    No outpatient medications have been marked as taking for the 08/17/22 encounter (Appointment) with Noeh Sparacino, Deirdre Evener, PA-C.     ROS:  Review of Systems  Constitutional:  Negative for fatigue, fever and unexpected weight change.  Respiratory:  Negative for cough, shortness of breath and wheezing.   Cardiovascular:  Negative for chest pain, palpitations and leg swelling.  Gastrointestinal:  Negative for blood in stool, constipation, diarrhea, nausea and vomiting.  Endocrine: Negative for cold intolerance, heat intolerance and polyuria.  Genitourinary:  Positive for frequency and urgency. Negative  for dyspareunia, dysuria, flank pain, genital sores, hematuria, menstrual problem, pelvic pain, vaginal bleeding, vaginal discharge and vaginal pain.  Musculoskeletal:  Negative for arthralgias, back pain, joint swelling and myalgias.  Skin:  Negative for rash.  Neurological:  Negative for dizziness, syncope, light-headedness, numbness and  headaches.  Hematological:  Negative for adenopathy.  Psychiatric/Behavioral:  Positive for dysphoric mood. Negative for agitation, confusion, sleep disturbance and suicidal ideas. The patient is not nervous/anxious.      Objective: There were no vitals taken for this visit.   Physical Exam Constitutional:      Appearance: She is well-developed.  Genitourinary:     Vulva normal.     Genitourinary Comments: UTERUS/CX SURG REM     No vaginal discharge, erythema or tenderness.      Right Adnexa: absent.    Right Adnexa: not tender and no mass present.    Left Adnexa: absent.    Left Adnexa: not tender and no mass present.    Cervix is absent.     Uterus is not tender.     Uterus is absent.  Breasts:    Right: No mass, nipple discharge, skin change or tenderness.     Left: No mass, nipple discharge, skin change or tenderness.  Neck:     Thyroid: No thyromegaly.  Cardiovascular:     Rate and Rhythm: Normal rate and regular rhythm.     Heart sounds: Normal heart sounds. No murmur heard. Pulmonary:     Effort: Pulmonary effort is normal.     Breath sounds: Normal breath sounds.  Abdominal:     Palpations: Abdomen is soft.     Tenderness: There is no abdominal tenderness. There is no guarding.  Musculoskeletal:        General: Normal range of motion.     Cervical back: Normal range of motion.  Neurological:     General: No focal deficit present.     Mental Status: She is alert and oriented to person, place, and time.     Cranial Nerves: No cranial nerve deficit.  Skin:    General: Skin is warm and dry.  Psychiatric:        Mood and  Affect: Mood normal.        Behavior: Behavior normal.        Thought Content: Thought content normal.        Judgment: Judgment normal.  Vitals reviewed.    UA neg, spec gravity 1.025, pH=8.0.   Assessment/Plan: Encounter for annual routine gynecological examination  Encounter for screening mammogram for malignant neoplasm of breast; pt current on mammo  Vasomotor symptoms due to menopause - Plan: AMBULATORY NON FORMULARY MEDICATION, estradiol (ESTRACE) 1 MG tablet  Hormone replacement therapy (HRT) - Plan: AMBULATORY NON FORMULARY MEDICATION, AMBULATORY NON FORMULARY MEDICATION, estradiol (ESTRACE) 1 MG tablet; Rx restart estradiol, sent to [facility] and Rx prog to [facility]. F/u prn.  Postmenopausal atrophic vaginitis - Plan: AMBULATORY NON FORMULARY MEDICATION; Rx RF estriol/testosterone supp to [facility]. F/u prn.  Abnormal urine odor - Plan: POCT Urinalysis Dipstick, Urine Culture; neg UA, check C&S. If neg, d/c caffeine. F/u prn.    No orders of the defined types were placed in this encounter.            GYN counsel breast self exam, mammography screening, menopause, adequate intake of calcium and vitamin D, diet and exercise     F/U  No follow-ups on file.  Benjerman Molinelli B. Analina Filla, PA-C 08/15/2022 8:50 PM

## 2022-08-17 ENCOUNTER — Other Ambulatory Visit (HOSPITAL_COMMUNITY)
Admission: RE | Admit: 2022-08-17 | Discharge: 2022-08-17 | Disposition: A | Discharge status: Home / Self Care | Payer: 59 | Source: Ambulatory Visit | Attending: Obstetrics and Gynecology | Admitting: Obstetrics and Gynecology

## 2022-08-17 ENCOUNTER — Encounter: Payer: Self-pay | Admitting: Obstetrics and Gynecology

## 2022-08-17 ENCOUNTER — Ambulatory Visit (INDEPENDENT_AMBULATORY_CARE_PROVIDER_SITE_OTHER): Payer: 59 | Admitting: Obstetrics and Gynecology

## 2022-08-17 VITALS — BP 120/70 | Ht 64.0 in | Wt 159.0 lb

## 2022-08-17 DIAGNOSIS — Z1151 Encounter for screening for human papillomavirus (HPV): Secondary | ICD-10-CM

## 2022-08-17 DIAGNOSIS — Z1231 Encounter for screening mammogram for malignant neoplasm of breast: Secondary | ICD-10-CM | POA: Diagnosis not present

## 2022-08-17 DIAGNOSIS — Z01419 Encounter for gynecological examination (general) (routine) without abnormal findings: Secondary | ICD-10-CM

## 2022-08-17 DIAGNOSIS — Z124 Encounter for screening for malignant neoplasm of cervix: Secondary | ICD-10-CM | POA: Insufficient documentation

## 2022-08-17 DIAGNOSIS — N951 Menopausal and female climacteric states: Secondary | ICD-10-CM

## 2022-08-17 DIAGNOSIS — N952 Postmenopausal atrophic vaginitis: Secondary | ICD-10-CM

## 2022-08-17 DIAGNOSIS — Z79890 Hormone replacement therapy (postmenopausal): Secondary | ICD-10-CM

## 2022-08-17 MED ORDER — ESTRADIOL 1 MG PO TABS: 1.0000 mg | ORAL_TABLET | Freq: Every day | ORAL | 3 refills | Status: DC

## 2022-08-17 MED ORDER — AMBULATORY NON FORMULARY MEDICATION: 2 refills | Status: DC

## 2022-08-17 MED ORDER — AMBULATORY NON FORMULARY MEDICATION: 3 refills | Status: DC

## 2022-08-17 NOTE — Patient Instructions (Signed)
I value your feedback and you entrusting us with your care. If you get a [facility] patient survey, I would appreciate you taking the time to let us know about your experience today. Thank you! ? ? ?

## 2022-08-20 LAB — CYTOLOGY - PAP
Comment: NEGATIVE
Diagnosis: NEGATIVE
High risk HPV: NEGATIVE

## 2022-09-22 ENCOUNTER — Telehealth: Payer: Self-pay

## 2022-09-22 NOTE — Telephone Encounter (Signed)
Spoke with patient on the phone who reports that pain began today and describes pain in left breast as a burning sensation. Patient denies discharge, discoloration or swelling of breast. Patient denies fever or heavy lifting or trauma to breast, advised patient that office visit would be need for evaluation of breast before changing order to diagnostic, patient agreed and request a call back from a scheduler to arrange appointment. KW

## 2022-09-22 NOTE — Telephone Encounter (Signed)
Patient contacted office stating that she has a mammogram ordered and reports that she began experiencing burning like pain in her left breast this morning and wanted to know if order should be changed to diagnostic?

## 2022-09-24 NOTE — Telephone Encounter (Signed)
I contacted patient via phone. I left voicemail for patient to call back to Confirm.

## 2022-09-26 NOTE — Progress Notes (Deleted)
Harrison Mons, PA   No chief complaint on file.   HPI:      [name] is a 51 y.o. [phone] whose LMP was No LMP recorded. Patient has had a hysterectomy., presents today for *** Last mammogram: 01/26/21 at [facility];  Results were: normal--routine follow-up in 12 months. Pt plans to schedule but moved out of town. There is no FH of breast cancer. There is a FH of ovarian cancer in her mat grt aunt, genetic testing not indicated for pt. The patient does do self-breast exams.     Patient Active Problem List   Diagnosis Date Noted   Vasomotor symptoms due to menopause 04/30/2021   Postmenopausal atrophic vaginitis 04/30/2021   Special screening for malignant neoplasms, colon 01/28/2021   Gastroesophageal reflux disease 01/28/2021   History of Barrett's esophagus 01/28/2021   Irritable bowel syndrome 01/28/2021   Chondromalacia of right patella 01/13/2018   Migraine headache 03/15/2013   Premature menopause on HRT 01/09/2013   Vestibular neuritis 08/04/2012   Depression with anxiety 08/04/2012   Insomnia 08/04/2012    Past Surgical History:  Procedure Laterality Date   APPENDECTOMY     BILATERAL SALPINGOOPHORECTOMY     BREAST BIOPSY Left 2015 x2   NEG   CESAREAN SECTION     x 2   CHOLECYSTECTOMY     ESOPHAGOGASTRODUODENOSCOPY (EGD) WITH PROPOFOL N/A 01/12/2016   Procedure: ESOPHAGOGASTRODUODENOSCOPY (EGD) WITH PROPOFOL;  Surgeon: Hulen Luster, MD;  Location: [facility] ENDOSCOPY;  Service: Gastroenterology;  Laterality: N/A;   KNEE SURGERY     right   Laparoscopy  1992/1993, 1996/1997   Endometriosis   TEMPOROMANDIBULAR JOINT SURGERY     TOTAL ABDOMINAL HYSTERECTOMY     ovaries removed   UPPER GASTROINTESTINAL ENDOSCOPY      Family History  Problem Relation Age of Onset   Diabetes Maternal Grandfather    Hypertension Mother    Stroke Mother    COPD Mother    Aneurysm Mother        brain   Transient ischemic attack Mother        multiple   Hyperlipidemia  Mother    Hypertension Father    Heart disease Father    Mental illness Father    Depression Father    Hyperlipidemia Father    Hyperlipidemia Brother    Hypertension Brother    Hypertension Brother    Diabetes Brother    Asthma Son    Colon cancer Neg Hx    Esophageal cancer Neg Hx    Rectal cancer Neg Hx    Stomach cancer Neg Hx     Social History   Socioeconomic History   Marital status: Married    Spouse name: Randall Hiss   Number of children: 2   Years of education: college   Highest education level: Not on file  Occupational History   Occupation: Physicist, medical    Comment: Acupuncturist  Tobacco Use   Smoking status: Never   Smokeless tobacco: Never  Vaping Use   Vaping Use: Never used  Substance and Sexual Activity   Alcohol use: Yes    Alcohol/week: 4.0 standard drinks of alcohol    Types: 4 Glasses of wine per week    Comment: occasional   Drug use: No   Sexual activity: Yes    Birth control/protection: Post-menopausal, Surgical    Comment: hysterectomy  Other Topics Concern   Not on file  Social History Narrative   Marital  Status:  Remarried in 03/2015.  Second marriage.        Children:  2 children (23 [name], [place]); no grandchildren.       Living:  living in apartment with husband in [place].  [name] will be going to [employer] in 2018-2019. Joint custody of Ian/stepson.    [name] is in [place].        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      Exercise: exercising sporadically in 2018; yoga and gym once per week.        Always uses seat belts / helmet.       Smoke alarm in the home.       No guns in the home.     Caffeine use: moderate      Education: Pensions consultant Strain: Not on file  Food Insecurity: Not on file  Transportation Needs: Not on file  Physical Activity: Not on file  Stress: Not on file   Social Connections: Not on file  Intimate Partner Violence: Not on file    Outpatient Medications Prior to Visit  Medication Sig Dispense Refill   AMBULATORY NON FORMULARY MEDICATION Progesterone 150 mg SR caps, Take 1 capsule nightly, 6 nights on, 1 night off 90 capsule 3   AMBULATORY NON FORMULARY MEDICATION Estriol-testosterone ovules '1mg'$ /'1mg'$  Insert 1 ovule vaginally 1-2 times weekly prn sx 30 Units 2   dicyclomine (BENTYL) 20 MG tablet Take 1 tablet (20 mg total) by mouth 2 (two) times daily as needed (abdominal cramping). 30 tablet 0   eletriptan (RELPAX) 40 MG tablet Take 1 tablet by mouth daily as needed.     estradiol (ESTRACE) 1 MG tablet Take 1 tablet (1 mg total) by mouth daily. 90 tablet 3   Galcanezumab-gnlm (EMGALITY) 120 MG/ML SOAJ Inject into the skin as directed.     ibuprofen (ADVIL,MOTRIN) 600 MG tablet Take 1 tablet (600 mg total) by mouth every 6 (six) hours as needed. 30 tablet 0   loratadine (CLARITIN) 10 MG tablet Take 10 mg by mouth daily.     montelukast (SINGULAIR) 10 MG tablet Take by mouth.     Multiple Vitamin (MULTIVITAMIN) tablet Take 1 tablet by mouth daily.     ondansetron (ZOFRAN) 4 MG tablet Take by mouth.     RABEprazole (ACIPHEX) 20 MG tablet Take 1 tablet (20 mg total) by mouth daily. 90 tablet 3   Rimegepant Sulfate (NURTEC) 75 MG TBDP Take by mouth.     No facility-administered medications prior to visit.      ROS:  Review of Systems BREAST: No symptoms   OBJECTIVE:   Vitals:  There were no vitals taken for this visit.  Physical Exam  Results: No results found for this or any previous visit (from the past 24 hour(s)).   Assessment/Plan: No diagnosis found.    No orders of the defined types were placed in this encounter.     No follow-ups on file.  Stephanee Barcomb B. Betsaida [place], PA-C 09/26/2022 2:17 PM

## 2022-09-28 ENCOUNTER — Ambulatory Visit: Payer: 59 | Admitting: Obstetrics and Gynecology

## 2022-09-30 NOTE — Telephone Encounter (Signed)
Patient was scheduled for 09/28/22 but per pt cancel with call back or message ABC.

## 2023-01-21 ENCOUNTER — Telehealth: Payer: Self-pay

## 2023-01-21 ENCOUNTER — Encounter: Payer: Self-pay | Admitting: Obstetrics and Gynecology

## 2023-01-24 ENCOUNTER — Other Ambulatory Visit: Payer: Self-pay | Admitting: Obstetrics and Gynecology

## 2023-01-24 DIAGNOSIS — N951 Menopausal and female climacteric states: Secondary | ICD-10-CM

## 2023-01-24 DIAGNOSIS — N952 Postmenopausal atrophic vaginitis: Secondary | ICD-10-CM

## 2023-01-24 DIAGNOSIS — Z79890 Hormone replacement therapy (postmenopausal): Secondary | ICD-10-CM

## 2023-01-24 MED ORDER — AMBULATORY NON FORMULARY MEDICATION: 1 refills | Status: DC

## 2023-01-24 MED ORDER — AMBULATORY NON FORMULARY MEDICATION: 0 refills | Status: DC

## 2023-01-24 NOTE — Telephone Encounter (Signed)
Rx printed. Pls call for fax # to send. Thx

## 2023-01-24 NOTE — Progress Notes (Signed)
Rx faxed to new pharm in [place] per pt request.

## 2023-01-24 NOTE — Telephone Encounter (Signed)
#   in note incorrect. Called pt, this is correct number: [phone]. Pt also provided fax number [phone] and Rx has been faxed.

## 2023-05-16 ENCOUNTER — Telehealth: Payer: Self-pay

## 2023-05-16 ENCOUNTER — Other Ambulatory Visit: Payer: Self-pay | Admitting: Obstetrics and Gynecology

## 2023-05-16 ENCOUNTER — Other Ambulatory Visit: Payer: Self-pay

## 2023-05-16 DIAGNOSIS — Z79890 Hormone replacement therapy (postmenopausal): Secondary | ICD-10-CM

## 2023-05-16 DIAGNOSIS — N952 Postmenopausal atrophic vaginitis: Secondary | ICD-10-CM

## 2023-05-16 MED ORDER — AMBULATORY NON FORMULARY MEDICATION: 0 refills | Status: DC

## 2023-05-16 NOTE — Telephone Encounter (Signed)
Pls fax to above pharmacy. Thx.

## 2023-05-16 NOTE — Telephone Encounter (Signed)
Angela Morgan could you resend her prescription for testosterone to [facility]. She reports that she is unable to get prescription. I asked her if it needed a prior authorization, she said no. She was not able to get it because it had been sent to [facility] in [place] previously. Please see if you can resend it to [facility]. FAX [phone].

## 2023-05-16 NOTE — Progress Notes (Signed)
Rx RF sent to new pharmacy.

## 2023-09-08 ENCOUNTER — Other Ambulatory Visit: Payer: Self-pay | Admitting: Obstetrics and Gynecology

## 2023-09-08 DIAGNOSIS — Z79890 Hormone replacement therapy (postmenopausal): Secondary | ICD-10-CM

## 2023-09-08 DIAGNOSIS — N951 Menopausal and female climacteric states: Secondary | ICD-10-CM

## 2023-09-16 ENCOUNTER — Other Ambulatory Visit: Payer: Self-pay | Admitting: Obstetrics and Gynecology

## 2023-09-16 DIAGNOSIS — N951 Menopausal and female climacteric states: Secondary | ICD-10-CM

## 2023-09-16 DIAGNOSIS — Z79890 Hormone replacement therapy (postmenopausal): Secondary | ICD-10-CM

## 2023-09-27 ENCOUNTER — Other Ambulatory Visit: Payer: Self-pay | Admitting: Obstetrics and Gynecology

## 2023-09-27 ENCOUNTER — Telehealth: Payer: Self-pay

## 2023-09-27 DIAGNOSIS — Z79890 Hormone replacement therapy (postmenopausal): Secondary | ICD-10-CM

## 2023-09-27 DIAGNOSIS — N951 Menopausal and female climacteric states: Secondary | ICD-10-CM

## 2023-09-27 DIAGNOSIS — N952 Postmenopausal atrophic vaginitis: Secondary | ICD-10-CM

## 2023-09-27 MED ORDER — AMBULATORY NON FORMULARY MEDICATION
0 refills | Status: AC
Start: 2023-09-27 — End: ?

## 2023-09-27 MED ORDER — ESTRADIOL 1 MG PO TABS: 1.0000 mg | ORAL_TABLET | Freq: Every day | ORAL | 0 refills | Status: AC

## 2023-09-27 MED ORDER — AMBULATORY NON FORMULARY MEDICATION
0 refills | Status: DC
Start: 2023-09-27 — End: 2023-11-23

## 2023-09-27 NOTE — Telephone Encounter (Signed)
Rxs to be faxed, estradiol eRxd to [facility]

## 2023-09-27 NOTE — Telephone Encounter (Signed)
Pt calling; has schedule annual for 12/18th; needs refills of all her medications - estrogen, progesterone, testosterone and another estrogen.  [phone]

## 2023-09-27 NOTE — Progress Notes (Signed)
Rx RF all HRT meds till 12/24 annual

## 2023-09-28 ENCOUNTER — Telehealth: Payer: Self-pay

## 2023-09-28 NOTE — Telephone Encounter (Signed)
Angela Morgan from [facility]; they recv'd a fax for medication refills; fax was cut off at the med names; please call.  [phone]  Spoke c Angela Morgan; verbal orders given.

## 2023-09-28 NOTE — Telephone Encounter (Signed)
Called pt, no answer, left detailed msg Rx's sent.

## 2023-11-21 NOTE — Progress Notes (Deleted)
PCP:  Porfirio Oar, PA   No chief complaint on file.    HPI:      [name] is a 52 y.o. [phone] who LMP was No LMP recorded. Patient has had a hysterectomy., presents today for her annual examination.  Her menses are absent due to hyst. She does not have PMB. She has been on estradiol 1 mg daily and prog 150 mg SR caps, 6 nights on, 1 night off for several yrs for vasomotor sx. Sometimes has sx, may be stress related. Otilio Carpen has difficulty staying asleep.  Sex activity: single partner, contraception - post menopausal status. She is using estradiol/testosterone vag supp 1-2 times weekly for vag dryness sx with sx relief, She is also using lubricants.  Last Pap: 08/17/22  Results were: no abnormalities /neg HPV DNA .  Hx of STDs: none  Last mammogram: 01/26/21 at [facility];  Results were: normal--routine follow-up in 12 months. Pt plans to schedule but moved out of town. There is no FH of breast cancer. There is a FH of ovarian cancer in her mat grt aunt, genetic testing not indicated for pt. The patient does do self-breast exams.  Tobacco use: The patient denies current or previous tobacco use. Alcohol use: none No drug use.  Exercise: very active  Colonoscopy:  3/21 without abnormalities, pt unsure when due again. Has yearly EGD due to Barrett's esophagus. Sees GI at [facility]  She does get adequate calcium and Vitamin D in her diet.  Labs with PCP.   Past Medical History:  Diagnosis Date   Allergic rhinitis, cause unspecified    Allergy    Anxiety    Arthritis    right knee   Depression    Depressive disorder, not elsewhere classified    GERD (gastroesophageal reflux disease)    with esophagitis   Insomnia, unspecified    Irritable bowel syndrome    Migraine, unspecified, without mention of intractable migraine without mention of status migrainosus    Pilonidal cyst without abscess 03/28/2013   Postablative ovarian failure    Symptomatic states associated with  artificial menopause    Vestibular dysfunction    Vestibular neuritis     Past Surgical History:  Procedure Laterality Date   APPENDECTOMY     BILATERAL SALPINGOOPHORECTOMY     BREAST BIOPSY Left 2015 x2   NEG   CESAREAN SECTION     x 2   CHOLECYSTECTOMY     ESOPHAGOGASTRODUODENOSCOPY (EGD) WITH PROPOFOL N/A 01/12/2016   Procedure: ESOPHAGOGASTRODUODENOSCOPY (EGD) WITH PROPOFOL;  Surgeon: Wallace Cullens, MD;  Location: [facility] ENDOSCOPY;  Service: Gastroenterology;  Laterality: N/A;   KNEE SURGERY     right   Laparoscopy  1992/1993, 1996/1997   Endometriosis   TEMPOROMANDIBULAR JOINT SURGERY     TOTAL ABDOMINAL HYSTERECTOMY     ovaries removed   UPPER GASTROINTESTINAL ENDOSCOPY      Family History  Problem Relation Age of Onset   Diabetes Maternal Grandfather    Hypertension Mother    Stroke Mother    COPD Mother    Aneurysm Mother        brain   Transient ischemic attack Mother        multiple   Hyperlipidemia Mother    Hypertension Father    Heart disease Father    Mental illness Father    Depression Father    Hyperlipidemia Father    Hyperlipidemia Brother    Hypertension Brother    Hypertension Brother  Diabetes Brother    Asthma Son    Colon cancer Neg Hx    Esophageal cancer Neg Hx    Rectal cancer Neg Hx    Stomach cancer Neg Hx     Social History   Socioeconomic History   Marital status: Married    Spouse name: Minerva Areola   Number of children: 2   Years of education: college   Highest education level: Not on file  Occupational History   Occupation: Radio broadcast assistant    Comment: Engineer, maintenance (IT)  Tobacco Use   Smoking status: Never   Smokeless tobacco: Never  Vaping Use   Vaping status: Never Used  Substance and Sexual Activity   Alcohol use: Yes    Alcohol/week: 4.0 standard drinks of alcohol    Types: 4 Glasses of wine per week    Comment: occasional   Drug use: No   Sexual activity: Yes    Birth control/protection: Post-menopausal,  Surgical    Comment: hysterectomy  Other Topics Concern   Not on file  Social History Narrative   Marital Status:  Remarried in 03/2015.  Second marriage.        Children:  2 children (23 [name], 39 William); no grandchildren.       Living:  living in apartment with husband in [place].  Chrissie Noa will be going to [employer] in 2018-2019. Joint custody of Ian/stepson.    [name] is in [place].        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  Loves job!!!!      Exercise: exercising sporadically in 2018; yoga and gym once per week.        Always uses seat belts / helmet.       Smoke alarm in the home.       No guns in the home.     Caffeine use: moderate      Education: [employer]                           Social Drivers of Health   Financial Resource Strain: Low Risk  (03/21/2023)   Received from [facility], [facility]   Overall Financial Resource Strain (CARDIA)    Difficulty of Paying Living Expenses: Not hard at all  Food Insecurity: No Food Insecurity (03/21/2023)   Received from [facility], [facility]   Hunger Vital Sign    Worried About Running Out of Food in the Last Year: Never true    Ran Out of Food in the Last Year: Never true  Transportation Needs: No Transportation Needs (03/21/2023)   Received from [facility], [facility]   PRAPARE - Transportation    Lack of Transportation (Medical): No    Lack of Transportation (Non-Medical): No  Physical Activity: Sufficiently Active (03/21/2023)   Received from [facility]   Exercise Vital Sign    Days of Exercise per Week: 4 days    Minutes of Exercise per Session: 60 min  Stress: No Stress Concern Present (03/21/2023)   Received from [facility], [facility] Occupational [facility] - Occupational Stress Questionnaire    Feeling of Stress : Only a little  Social Connections: Somewhat Isolated (03/21/2023)   Received from [facility], [facility]    Social Network    How would you rate your social network (family, work, friends)?: Restricted participation with some degree of social isolation  Intimate Partner  Violence: Not At Risk (03/21/2023)   Received from [facility]   HITS    Over the last 12 months how often did your partner physically hurt you?: Never    Over the last 12 months how often did your partner insult you or talk down to you?: Fairly often    Over the last 12 months how often did your partner threaten you with physical harm?: Never    Over the last 12 months how often did your partner scream or curse at you?: Sometimes    No outpatient medications have been marked as taking for the 11/23/23 encounter (Appointment) with Jassica Zazueta, Ilona Sorrel, PA-C.     ROS:  Review of Systems  Constitutional:  Negative for fatigue, fever and unexpected weight change.  Respiratory:  Negative for cough, shortness of breath and wheezing.   Cardiovascular:  Negative for chest pain, palpitations and leg swelling.  Gastrointestinal:  Negative for blood in stool, constipation, diarrhea, nausea and vomiting.  Endocrine: Negative for cold intolerance, heat intolerance and polyuria.  Genitourinary:  Negative for dyspareunia, dysuria, flank pain, frequency, genital sores, hematuria, menstrual problem, pelvic pain, urgency, vaginal bleeding, vaginal discharge and vaginal pain.  Musculoskeletal:  Negative for back pain, joint swelling and myalgias.  Skin:  Negative for rash.  Neurological:  Positive for headaches. Negative for dizziness, syncope, light-headedness and numbness.  Hematological:  Negative for adenopathy.  Psychiatric/Behavioral:  Negative for agitation, confusion, sleep disturbance and suicidal ideas. The patient is not nervous/anxious.      Objective: There were no vitals taken for this visit.   Physical Exam Constitutional:      Appearance: She is well-developed.  Genitourinary:     Vulva normal.      Genitourinary Comments: UTERUS/CX SURG REM     Right Labia: No rash, tenderness or lesions.    Left Labia: No tenderness, lesions or rash.    Vaginal cuff intact.    No vaginal discharge, erythema or tenderness.      Right Adnexa: not tender and no mass present.    Left Adnexa: not tender and no mass present.    Cervix is absent.     Uterus is absent.  Breasts:    Right: No mass, nipple discharge, skin change or tenderness.     Left: No mass, nipple discharge, skin change or tenderness.  Neck:     Thyroid: No thyromegaly.  Cardiovascular:     Rate and Rhythm: Normal rate and regular rhythm.     Heart sounds: Normal heart sounds. No murmur heard. Pulmonary:     Effort: Pulmonary effort is normal.     Breath sounds: Normal breath sounds.  Abdominal:     Palpations: Abdomen is soft.     Tenderness: There is no abdominal tenderness. There is no guarding.  Musculoskeletal:        General: Normal range of motion.     Cervical back: Normal range of motion.  Neurological:     General: No focal deficit present.     Mental Status: She is alert and oriented to person, place, and time.     Cranial Nerves: No cranial nerve deficit.  Skin:    General: Skin is warm and dry.  Psychiatric:        Mood and Affect: Mood normal.        Behavior: Behavior normal.        Thought Content: Thought content normal.        Judgment: Judgment normal.  Vitals reviewed.     Assessment/Plan:  Encounter for annual routine gynecological examination  Cervical cancer screening - Plan: Cytology - PAP  Screening for HPV (human papillomavirus) - Plan: Cytology - PAP  Encounter for screening mammogram for malignant neoplasm of breast - Plan: MM 3D SCREEN BREAST BILATERAL; pt to schedule mammo. Will do ref prn.   Postmenopausal atrophic vaginitis - Plan: AMBULATORY NON FORMULARY MEDICATION; Rx RF vag ERT. Doing well, f/u prn.   Vasomotor symptoms due to menopause - Plan: estradiol (ESTRACE) 1 MG  tablet, AMBULATORY NON FORMULARY MEDICATION; Rx estradiol RF to [employer], Rx prog to [employer]. F/u prn.   Hormone replacement therapy (HRT) - Plan: estradiol (ESTRACE) 1 MG tablet, AMBULATORY NON FORMULARY MEDICATION, AMBULATORY NON FORMULARY MEDICATION   No orders of the defined types were placed in this encounter.            GYN counsel breast self exam, mammography screening, menopause, adequate intake of calcium and vitamin D, diet and exercise     F/U  No follow-ups on file.  Corey Caulfield B. Nicholaos Schippers, PA-C 11/21/2023 1:12 PM

## 2023-11-22 ENCOUNTER — Telehealth: Payer: Self-pay

## 2023-11-22 NOTE — Telephone Encounter (Signed)
Pt left msg on triage stating she had an appt with [facility] on 11/23/23 but she had to r/s it due to hurting her back and her drive here is over an hour. Her appt has been rescheduled to 12/19/23 and she is asking if she can get a refill of her testosterone and insertion tablets. [facility].

## 2023-11-23 ENCOUNTER — Other Ambulatory Visit: Payer: Self-pay | Admitting: Obstetrics and Gynecology

## 2023-11-23 ENCOUNTER — Ambulatory Visit: Payer: Self-pay | Admitting: Obstetrics and Gynecology

## 2023-11-23 DIAGNOSIS — N952 Postmenopausal atrophic vaginitis: Secondary | ICD-10-CM

## 2023-11-23 DIAGNOSIS — Z79890 Hormone replacement therapy (postmenopausal): Secondary | ICD-10-CM

## 2023-11-23 DIAGNOSIS — Z01419 Encounter for gynecological examination (general) (routine) without abnormal findings: Secondary | ICD-10-CM

## 2023-11-23 DIAGNOSIS — Z1211 Encounter for screening for malignant neoplasm of colon: Secondary | ICD-10-CM

## 2023-11-23 DIAGNOSIS — N951 Menopausal and female climacteric states: Secondary | ICD-10-CM

## 2023-11-23 DIAGNOSIS — Z1231 Encounter for screening mammogram for malignant neoplasm of breast: Secondary | ICD-10-CM

## 2023-11-23 MED ORDER — AMBULATORY NON FORMULARY MEDICATION: 0 refills | Status: AC

## 2023-11-23 NOTE — Telephone Encounter (Signed)
Rx faxed

## 2023-11-23 NOTE — Telephone Encounter (Signed)
Pls fax Rx to above pharm and notify pt. Thx.

## 2023-11-23 NOTE — Progress Notes (Signed)
Rx RF estriol/testosterone ovules until 1/25 appt.

## 2023-12-19 ENCOUNTER — Ambulatory Visit: Payer: Self-pay | Admitting: Obstetrics and Gynecology

## 2023-12-21 ENCOUNTER — Other Ambulatory Visit: Payer: Self-pay | Admitting: Obstetrics and Gynecology

## 2023-12-21 DIAGNOSIS — Z79890 Hormone replacement therapy (postmenopausal): Secondary | ICD-10-CM

## 2023-12-21 DIAGNOSIS — N951 Menopausal and female climacteric states: Secondary | ICD-10-CM

## 2023-12-24 ENCOUNTER — Other Ambulatory Visit: Payer: Self-pay | Admitting: Obstetrics and Gynecology

## 2023-12-24 DIAGNOSIS — Z79890 Hormone replacement therapy (postmenopausal): Secondary | ICD-10-CM

## 2023-12-24 DIAGNOSIS — N951 Menopausal and female climacteric states: Secondary | ICD-10-CM

## 2024-01-05 ENCOUNTER — Ambulatory Visit: Payer: Self-pay | Admitting: Obstetrics and Gynecology
